# Patient Record
Sex: Female | Born: 2000 | Race: Black or African American | Hispanic: No | Marital: Single | Smoking: Never smoker
Health system: Southern US, Community
[De-identification: ages and names within clinical notes are randomized; demographics above are authoritative.]

## PROBLEM LIST (undated history)

## (undated) ENCOUNTER — Ambulatory Visit: Payer: MEDICAID | Source: Home / Self Care

## (undated) ENCOUNTER — Inpatient Hospital Stay (HOSPITAL_COMMUNITY): Payer: Self-pay

## (undated) ENCOUNTER — Ambulatory Visit: Payer: MEDICAID

## (undated) DIAGNOSIS — D649 Anemia, unspecified: Secondary | ICD-10-CM

## (undated) DIAGNOSIS — N83209 Unspecified ovarian cyst, unspecified side: Secondary | ICD-10-CM

## (undated) DIAGNOSIS — R519 Headache, unspecified: Secondary | ICD-10-CM

## (undated) DIAGNOSIS — F419 Anxiety disorder, unspecified: Secondary | ICD-10-CM

## (undated) DIAGNOSIS — Z91030 Bee allergy status: Secondary | ICD-10-CM

## (undated) DIAGNOSIS — Z9109 Other allergy status, other than to drugs and biological substances: Secondary | ICD-10-CM

## (undated) DIAGNOSIS — F32A Depression, unspecified: Secondary | ICD-10-CM

## (undated) DIAGNOSIS — N390 Urinary tract infection, site not specified: Secondary | ICD-10-CM

## (undated) HISTORY — PX: IUD INSERTION: OBO1003

## (undated) HISTORY — PX: NO PAST SURGERIES: SHX2092

## (undated) HISTORY — PX: TOOTH EXTRACTION: SUR596

---
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
## 2019-03-06 IMAGING — US US PELVIS COMPLETE
1 series · 13 of 25 positions shown · non-contrast
Comparison: None.

CLINICAL DATA: Initial evaluation for acute bilateral pelvic pain
for 1 week.

EXAM:
TRANSABDOMINAL AND TRANSVAGINAL ULTRASOUND OF PELVIS
DOPPLER ULTRASOUND OF OVARIES
TECHNIQUE: Both transabdominal and transvaginal ultrasound examinations of the
pelvis were performed. Transabdominal technique was performed for
global imaging of the pelvis including uterus, ovaries, adnexal
regions, and pelvic cul-de-sac.
It was necessary to proceed with endovaginal exam following the
transabdominal exam to visualize the uterus, endometrium, and
ovaries. Color and duplex Doppler ultrasound was utilized to
evaluate blood flow to the ovaries.

[Series 1: us pelvis complete · 133 acquisitions, 13 frames shown]
[im 1/133]
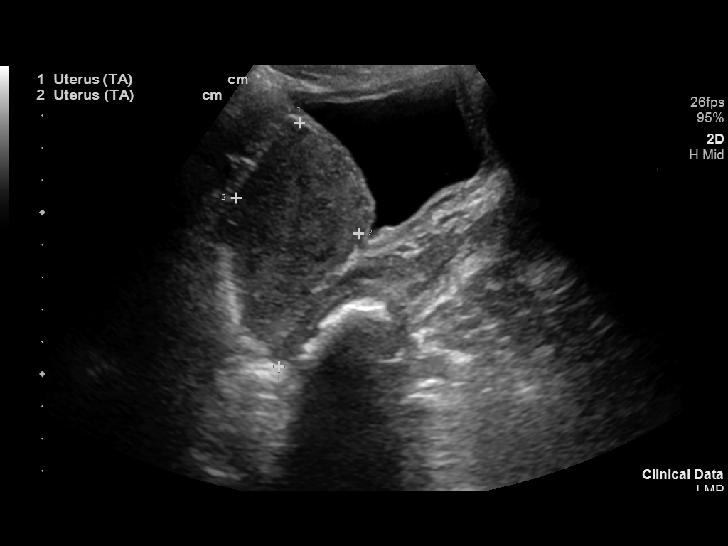
[im 12/133]
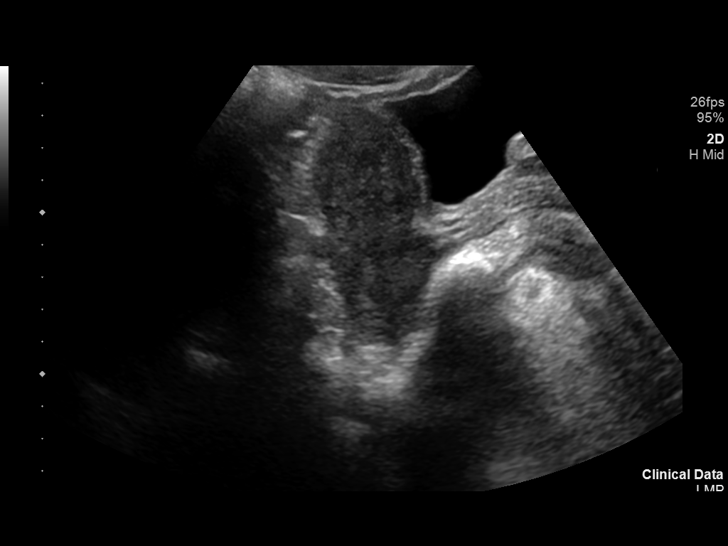
[im 23/133]
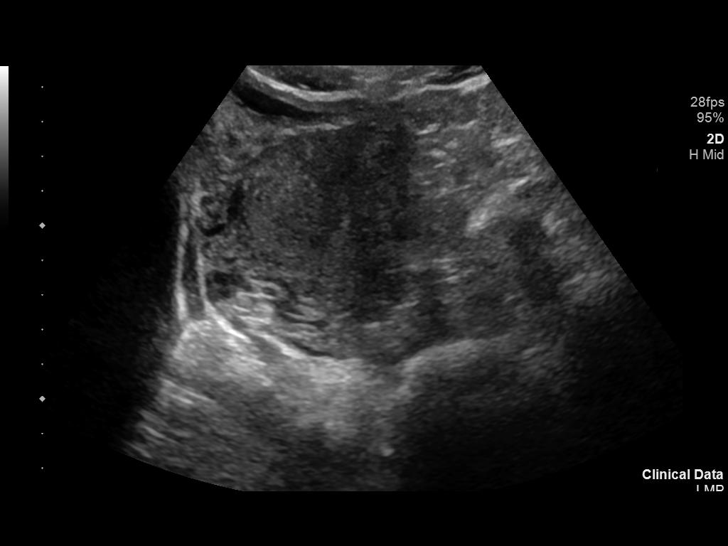
[im 34/133]
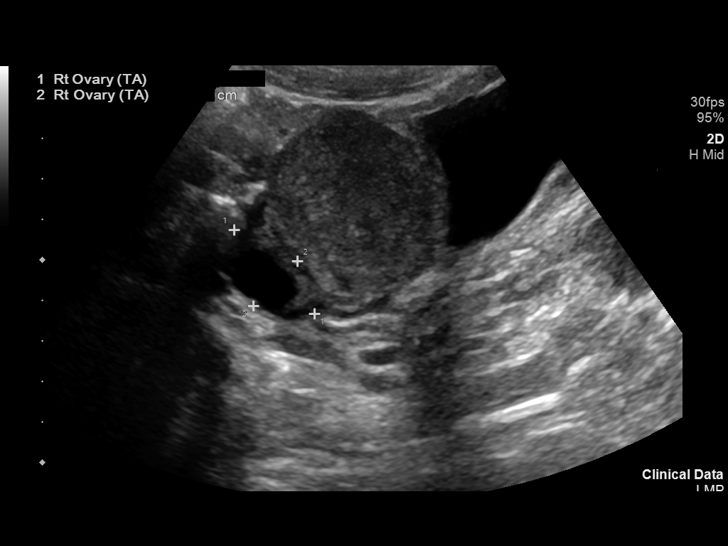
[im 45/133]
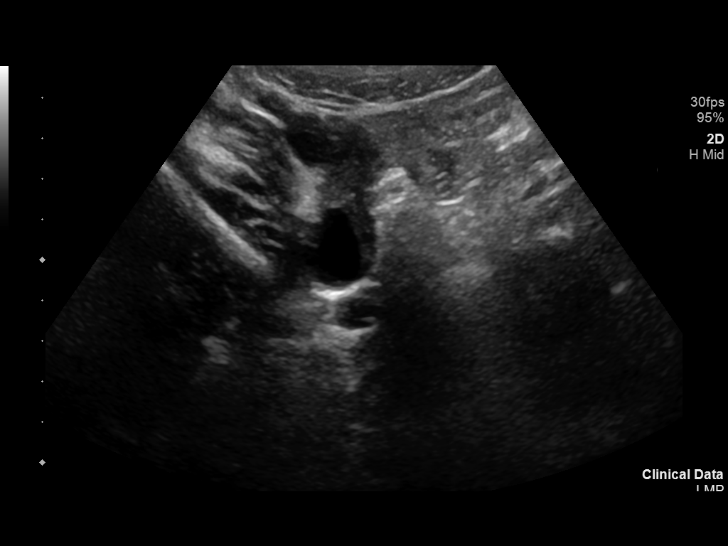
[im 56/133]
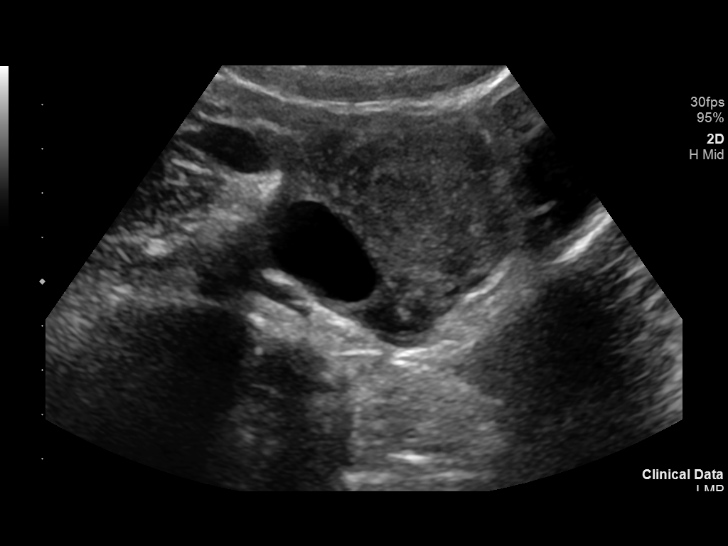
[im 67/133]
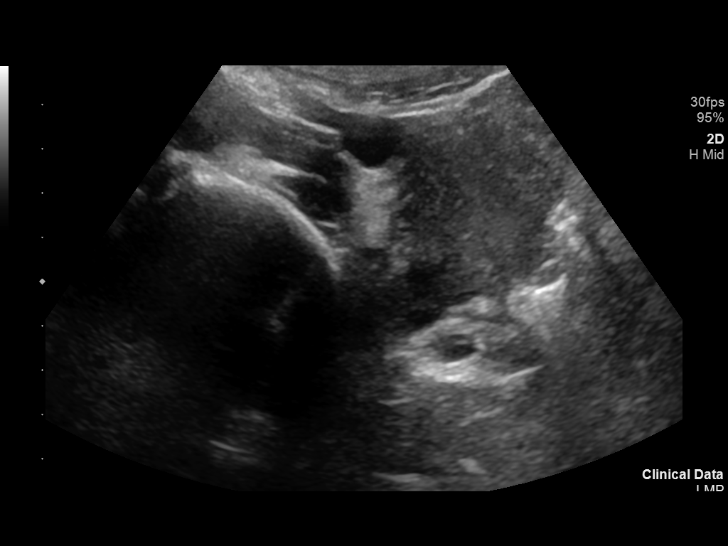
[im 78/133]
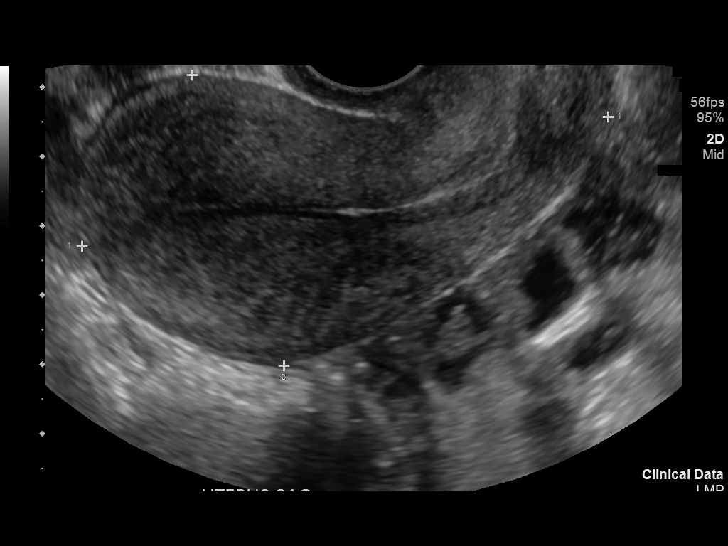
[im 89/133]
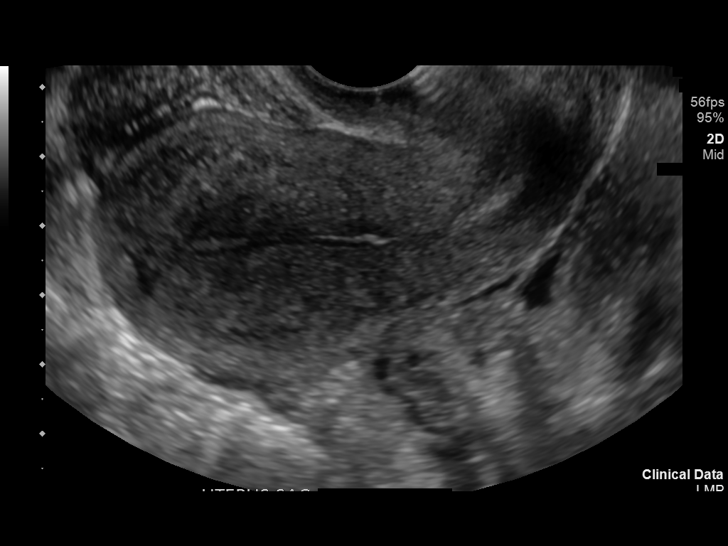
[im 100/133]
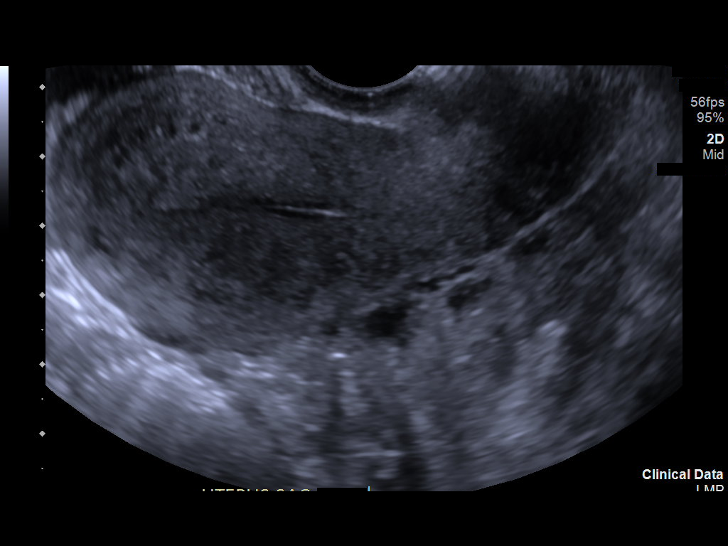
[im 111/133]
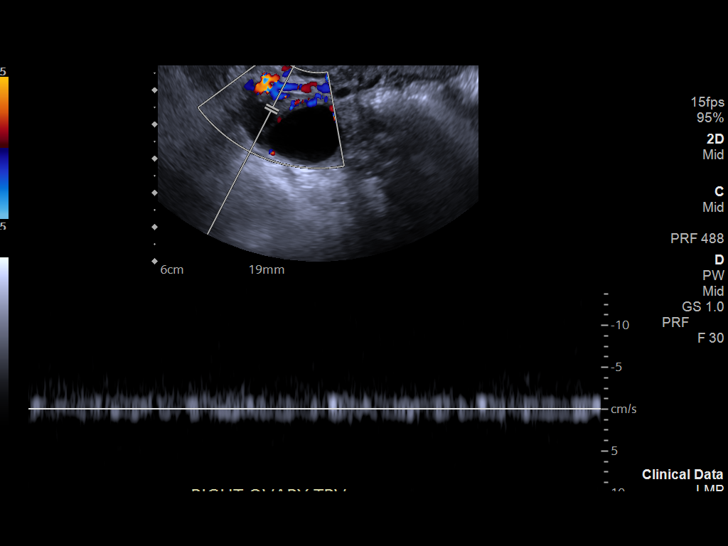
[im 122/133]
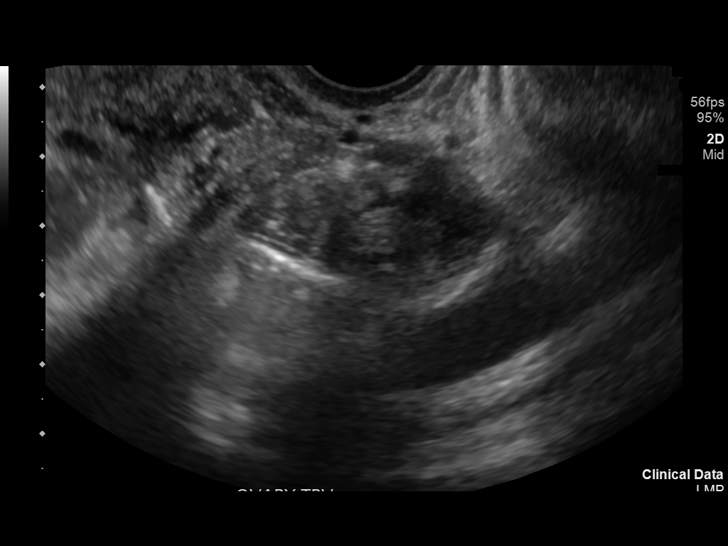
[im 133/133]
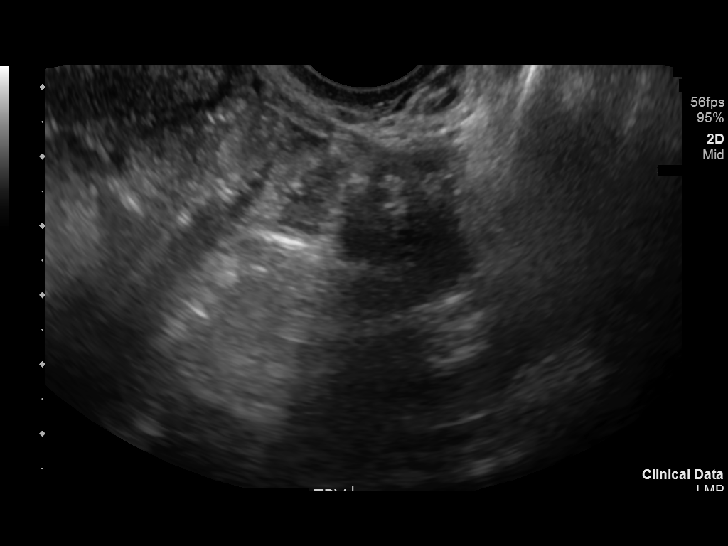

[13 of 25 positions shown; findings below may reference images not displayed]

FINDINGS: Uterus

Measurements: 7.8 x 4.4 x 4.9 cm = volume: 88.5 mL. No fibroids or
other mass visualized.

Endometrium

Thickness: 2.3 mm.  No focal abnormality visualized.

Right ovary

Measurements: 3.1 x 2.1 x 2.8 cm = volume: 9.8 mL. Normal
appearance/no adnexal mass. 2.4 x 1.5 x 2.1 cm dominant follicle
noted.

Left ovary

Measurements: 3.6 x 1.4 x 2.0 cm = volume: 5.3 mL. Normal
appearance/no adnexal mass.

Pulsed Doppler evaluation of both ovaries demonstrates normal
low-resistance arterial and venous waveforms.

Other findings

No abnormal free fluid.
IMPRESSION: 1. Normal pelvic ultrasound. No evidence for torsion or other acute
finding.
2. 2.4 cm simple right ovarian cyst, most consistent with a dominant
follicle/physiologic follicular cyst.

## 2019-04-28 ENCOUNTER — Encounter (HOSPITAL_COMMUNITY): Payer: Self-pay

## 2019-04-28 ENCOUNTER — Ambulatory Visit (HOSPITAL_COMMUNITY)
Admission: EM | Admit: 2019-04-28 | Discharge: 2019-04-28 | Disposition: A | Discharge status: Home / Self Care | Payer: Medicaid Other | Attending: Emergency Medicine | Admitting: Emergency Medicine

## 2019-04-28 ENCOUNTER — Other Ambulatory Visit: Payer: Self-pay

## 2019-04-28 DIAGNOSIS — Z3202 Encounter for pregnancy test, result negative: Secondary | ICD-10-CM | POA: Diagnosis not present

## 2019-04-28 DIAGNOSIS — R102 Pelvic and perineal pain: Secondary | ICD-10-CM | POA: Diagnosis present

## 2019-04-28 LAB — POCT URINALYSIS DIP (DEVICE)
Bilirubin Urine: NEGATIVE
Glucose, UA: NEGATIVE mg/dL
Ketones, ur: NEGATIVE mg/dL
Leukocytes,Ua: NEGATIVE
Nitrite: NEGATIVE
Protein, ur: >=300 mg/dL — AB
Specific Gravity, Urine: >=1.03 (ref 1.005–1.030)
Urobilinogen, UA: 0.2 mg/dL (ref 0.0–1.0)
pH: 6.5 (ref 5.0–8.0)

## 2019-04-28 LAB — POCT PREGNANCY, URINE: Preg Test, Ur: NEGATIVE

## 2019-04-28 MED ORDER — NAPROXEN 375 MG PO TABS: 375.0000 mg | ORAL_TABLET | Freq: Two times a day (BID) | ORAL | 0 refills | Status: DC

## 2019-04-28 NOTE — Discharge Instructions (Signed)
You do not have any obvious UTI here today, I will send a culture to confirm this.  We will check for infection to ensure this is not the source either. Menstruation or ovarian cysts may contribute. If symptoms persist I do recommend following up with gynecology.  Naproxen twice a day may be helpful.  Drink plenty of water to empty your bladder regularly.

## 2019-04-28 NOTE — ED Provider Notes (Signed)
[facility]    CSN: [id number] Arrival date & time: 04/28/19  3382      History   Chief Complaint Chief Complaint  Patient presents with  . Urinary Frequency    HPI Valerie Newman is a 18 y.o. female.   Valerie Newman presents with complaints of some sensation of pelvic pain which is intermittent, worse when her bladder is full. Some urinary urgency. No frequency, no dysuria. Started approximately 1 week ago. No blood in urine. No back pain, no fevers. No current abdominal pain. No vaginal discharge. Doesn't take any medications. History  Of ovarian cysts. Doesn't follow regularly with a gynecologist. She is currently on her period.     ROS per HPI, negative if not otherwise mentioned.      Past Medical History:  Diagnosis Date  . 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 Allergy to pollen   . Anemia   . Anxiety   . Asthma     Patient Active Problem List   Diagnosis Date Noted  . Nexplanon insertion   . Chlamydia infection 10/15/2017    Past Surgical History:  Procedure Laterality Date  . TOOTH EXTRACTION      OB History    Gravida  1   Para  1   Term  1   Preterm  0   AB  0   Living  1     SAB  0   TAB  0   Ectopic  0   Multiple  0   Live Births  1            Home Medications    Prior to Admission medications   Medication Sig Start Date End Date Taking? Authorizing Provider  famotidine (PEPCID) 20 MG tablet Take 1 tablet (20 mg total) by mouth daily. Patient not taking: Reported on 10/03/2018 12/01/17   Marny Lowenstein, PA-C  hydrocortisone (ANUSOL-HC) 2.5 % rectal cream Place 1 application rectally 2 (two) times daily. 10/03/18   [name], NP  ibuprofen (ADVIL,MOTRIN) 600 MG tablet Take 1 tablet (600 mg total) by mouth every 8 (eight) hours as needed for moderate pain. 07/14/18   Shaune Pollack, MD  iron polysaccharides (FERREX 150) 150 MG capsule Take 1 capsule (150 mg total) by mouth daily. 10/03/18   [name], NP  naproxen (NAPROSYN) 375 MG tablet Take 1 tablet (375 mg total) by mouth 2 (two) times daily. 04/28/19   Georgetta Haber, NP  ondansetron (ZOFRAN ODT) 4 MG disintegrating tablet Take 1 tablet (4 mg total) by mouth every 8 (eight) hours as needed for nausea or vomiting. 07/14/18   Shaune Pollack, MD  Prenatal Multivit-Min-Fe-FA (PRENATAL VITAMINS PO) Take 1 tablet by mouth daily.    [provider]  PRESCRIPTION MEDICATION Apply 1 application topically as needed (rash). Pt states she's using a prescription strength cream to help with a rash on stomach/neck line. [facility] had no history of a cream.    [provider]  simethicone (GAS-X) 80 MG chewable tablet Chew 1 tablet (80 mg total) by mouth every 6 (six) hours as needed for flatulence. 07/14/18   Gwenevere Abbot, MD    Family History Family History  Problem Relation Age of Onset  . Heart disease Mother   . Stroke Mother   . Diabetes Maternal Grandmother     Social History Social History   Tobacco Use  . Smoking status: Never Smoker  . Smokeless tobacco:  Never Used  Substance Use Topics  . Alcohol use: No    Frequency: Never  . Drug use: No     Allergies   Bee venom and Wasp venom   Review of Systems Review of Systems   Physical Exam Triage Vital Signs ED Triage Vitals  Enc Vitals Group     BP 04/28/19 0953 123/76     Pulse Rate 04/28/19 0953 84     Resp 04/28/19 0953 17     Temp 04/28/19 0953 98 F (36.7 C)     Temp Source 04/28/19 0953 Oral     SpO2 04/28/19 0953 99 %     Weight --      Height --      Head Circumference --      Peak Flow --      Pain Score 04/28/19 0951 0     Pain Loc --      Pain Edu? --      Excl. in GC? --    No data found.  Updated Vital Signs BP 123/76 (BP Location: Right Arm)   Pulse 84   Temp 98 F (36.7 C) (Oral)   Resp 17   SpO2 99%    Physical Exam Constitutional:      General: She is not in acute distress.    Appearance: She is  well-developed.  Cardiovascular:     Rate and Rhythm: Normal rate.  Pulmonary:     Effort: Pulmonary effort is normal.  Abdominal:     Palpations: Abdomen is soft. Abdomen is not rigid.     Tenderness: There is no abdominal tenderness. There is no guarding or rebound.  Genitourinary:    Comments: Denies sores, lesions; no pelvic pain; gu exam deferred at this time, vaginal self swab collected; endorses menstrual bleeding  Skin:    General: Skin is warm and dry.  Neurological:     Mental Status: She is alert and oriented to person, place, and time.      UC Treatments / Results  Labs (all labs ordered are listed, but only abnormal results are displayed) Labs Reviewed  POCT URINALYSIS DIP (DEVICE) - Abnormal; Notable for the following components:      Result Value   Hgb urine dipstick SMALL (*)    Protein, ur >=300 (*)    All other components within normal limits  URINE CULTURE  POC URINE PREG, ED  POCT PREGNANCY, URINE  CERVICOVAGINAL ANCILLARY ONLY    EKG   Radiology No results found.  Procedures Procedures (including critical care time)  Medications Ordered in UC Medications - No data to display  Initial Impression / Assessment and Plan / UC Course  I have reviewed the triage vital signs and the nursing notes.  Pertinent labs & imaging results that were available during my care of the patient were reviewed by me and considered in my medical decision making (see chart for details).     No abdominal pain currently. Afebrile. Non toxic. Urine without indication of UTI at this time, culture pending. Vaginal cytology also pending. Menstrual cramping discussed vs ovarian cysts. Pain management discussed. Encouraged follow up with gynecology. Return precautions provided. Patient verbalized understanding and agreeable to plan.   Final Clinical Impressions(s) / UC Diagnoses   Final diagnoses:  Pelvic pain     Discharge Instructions     You do not have any obvious  UTI here today, I will send a culture to confirm this.  We will check for infection  to ensure this is not the source either. Menstruation or ovarian cysts may contribute. If symptoms persist I do recommend following up with gynecology.  Naproxen twice a day may be helpful.  Drink plenty of water to empty your bladder regularly.    ED Prescriptions    Medication Sig Dispense Auth. Provider   naproxen (NAPROSYN) 375 MG tablet Take 1 tablet (375 mg total) by mouth 2 (two) times daily. 20 tablet Zigmund Gottron, NP     PDMP not reviewed this encounter.   Zigmund Gottron, NP 04/28/19 1127

## 2019-04-28 NOTE — ED Triage Notes (Signed)
Patient presents to Urgent Care with complaints of urinary frequency since a few days ago. Patient reports last time this happened she was pregnant, but does not think she is pregnant this time.

## 2019-04-29 LAB — URINE CULTURE: Culture: NO GROWTH

## 2019-05-02 LAB — CERVICOVAGINAL ANCILLARY ONLY
Bacterial vaginitis: NEGATIVE
Candida vaginitis: NEGATIVE
Chlamydia: NEGATIVE
Neisseria Gonorrhea: NEGATIVE
Trichomonas: NEGATIVE

## 2019-05-08 ENCOUNTER — Other Ambulatory Visit: Payer: Self-pay

## 2019-05-08 ENCOUNTER — Encounter (INDEPENDENT_AMBULATORY_CARE_PROVIDER_SITE_OTHER): Payer: Self-pay | Admitting: Primary Care

## 2019-05-08 ENCOUNTER — Encounter (HOSPITAL_COMMUNITY): Payer: Self-pay

## 2019-05-08 ENCOUNTER — Ambulatory Visit (HOSPITAL_COMMUNITY)
Admission: EM | Admit: 2019-05-08 | Discharge: 2019-05-08 | Disposition: A | Discharge status: Home / Self Care | Payer: Medicaid Other | Attending: Family Medicine | Admitting: Family Medicine

## 2019-05-08 DIAGNOSIS — R102 Pelvic and perineal pain: Secondary | ICD-10-CM | POA: Diagnosis not present

## 2019-05-08 DIAGNOSIS — N939 Abnormal uterine and vaginal bleeding, unspecified: Secondary | ICD-10-CM | POA: Diagnosis not present

## 2019-05-08 DIAGNOSIS — Z3202 Encounter for pregnancy test, result negative: Secondary | ICD-10-CM

## 2019-05-08 LAB — POCT URINALYSIS DIP (DEVICE)
Glucose, UA: NEGATIVE mg/dL
Ketones, ur: NEGATIVE mg/dL
Nitrite: NEGATIVE
Protein, ur: >=300 mg/dL — AB
Specific Gravity, Urine: 1.025 (ref 1.005–1.030)
Urobilinogen, UA: 0.2 mg/dL (ref 0.0–1.0)
pH: 6.5 (ref 5.0–8.0)

## 2019-05-08 LAB — POC URINE PREG, ED: Preg Test, Ur: NEGATIVE

## 2019-05-08 LAB — POCT PREGNANCY, URINE: Preg Test, Ur: NEGATIVE

## 2019-05-08 NOTE — ED Triage Notes (Addendum)
Pt presents to UC w/ c/o seeing spotting after sexual intercourse yesterday. Pt states she is having some cramps. Pt reports seeing blood when she wiped this morning as well as some pain in lower abdomen. Pt states she finished her menstrual period 2 weeks ago.

## 2019-05-08 NOTE — ED Provider Notes (Signed)
[place]    CSN: [id number] Arrival date & time: 05/08/19  0802      History   Chief Complaint Chief Complaint  Patient presents with  . vag spotting    HPI Valerie Newman is a 18 y.o. female.   Corrin Newman presents with complaints of vaginal spotting which started yesterday. Pelvic cramping. This feels different than cramping she has had previously. Had sexual intercourse prior to spotting onset. Was seen here two weeks ago with pelvic cramping, normal urine, negative culture and negative vaginal cytology at that time. LMP was two weeks ago. She has an IUD. Some pain with urination and some frequency. No blood to urine. No back or flank pain. No gi symptoms. History of ovarian cyst in the past. Hasn't followed with gynecology.    ROS per HPI, negative if not otherwise mentioned.      Past Medical History:  Diagnosis Date  . 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 Allergy to pollen   . Anemia   . Anxiety   . Asthma     Patient Active Problem List   Diagnosis Date Noted  . Nexplanon insertion   . Chlamydia infection 10/15/2017    Past Surgical History:  Procedure Laterality Date  . TOOTH EXTRACTION      OB History    Gravida  1   Para  1   Term  1   Preterm  0   AB  0   Living  1     SAB  0   TAB  0   Ectopic  0   Multiple  0   Live Births  1            Home Medications    Prior to Admission medications   Medication Sig Start Date End Date Taking? Authorizing Provider  famotidine (PEPCID) 20 MG tablet Take 1 tablet (20 mg total) by mouth daily. Patient not taking: Reported on 10/03/2018 12/01/17   Luvenia Redden, PA-C  hydrocortisone (ANUSOL-HC) 2.5 % rectal cream Place 1 application rectally 2 (two) times daily. 10/03/18   Kerin Perna, NP  ibuprofen (ADVIL,MOTRIN) 600 MG tablet Take 1 tablet (600 mg total) by mouth every 8 (eight) hours as needed for moderate pain. 07/14/18   Duffy Bruce, MD  iron  polysaccharides (FERREX 150) 150 MG capsule Take 1 capsule (150 mg total) by mouth daily. 10/03/18   Kerin Perna, NP  naproxen (NAPROSYN) 375 MG tablet Take 1 tablet (375 mg total) by mouth 2 (two) times daily. 04/28/19   Zigmund Gottron, NP  ondansetron (ZOFRAN ODT) 4 MG disintegrating tablet Take 1 tablet (4 mg total) by mouth every 8 (eight) hours as needed for nausea or vomiting. 07/14/18   Duffy Bruce, MD  Prenatal Multivit-Min-Fe-FA (PRENATAL VITAMINS PO) Take 1 tablet by mouth daily.    [provider]  PRESCRIPTION MEDICATION Apply 1 application topically as needed (rash). Pt states she's using a prescription strength cream to help with a rash on stomach/neck line. [facility] had no history of a cream.    [provider]  simethicone (GAS-X) 80 MG chewable tablet Chew 1 tablet (80 mg total) by mouth every 6 (six) hours as needed for flatulence. 07/14/18   Aura Camps, MD    Family History Family History  Problem Relation Age of Onset  . Heart disease Mother   . Stroke Mother   . Diabetes Maternal Grandmother  Social History Social History   Tobacco Use  . Smoking status: Never Smoker  . Smokeless tobacco: Never Used  Substance Use Topics  . Alcohol use: No    Frequency: Never  . Drug use: No     Allergies   Bee venom and Wasp venom   Review of Systems Review of Systems   Physical Exam Triage Vital Signs ED Triage Vitals  Enc Vitals Group     BP 05/08/19 0814 104/69     Pulse Rate 05/08/19 0814 87     Resp 05/08/19 0814 16     Temp 05/08/19 0814 99.1 F (37.3 C)     Temp Source 05/08/19 0814 Oral     SpO2 05/08/19 0814 100 %     Weight --      Height --      Head Circumference --      Peak Flow --      Pain Score 05/08/19 0817 0     Pain Loc --      Pain Edu? --      Excl. in GC? --    No data found.  Updated Vital Signs BP 104/69 (BP Location: Left Arm)   Pulse 87   Temp 99.1 F (37.3 C) (Oral)   Resp 16   SpO2  100%   Visual Acuity Right Eye Distance:   Left Eye Distance:   Bilateral Distance:    Right Eye Near:   Left Eye Near:    Bilateral Near:     Physical Exam Constitutional:      General: She is not in acute distress.    Appearance: She is well-developed.  Cardiovascular:     Rate and Rhythm: Normal rate.  Pulmonary:     Effort: Pulmonary effort is normal.  Abdominal:     Tenderness: There is no right CVA tenderness, left CVA tenderness, guarding or rebound.  Genitourinary:    Labia:        Right: No tenderness or lesion.        Left: No tenderness or lesion.      Cervix: Normal.     Comments: Dark brown blood, small amount, within vaginal vault without visualization of any active bleeding; IUD strings present Skin:    General: Skin is warm and dry.  Neurological:     Mental Status: She is alert and oriented to person, place, and time.      UC Treatments / Results  Labs (all labs ordered are listed, but only abnormal results are displayed) Labs Reviewed  POCT URINALYSIS DIP (DEVICE) - Abnormal; Notable for the following components:      Result Value   Bilirubin Urine SMALL (*)    Hgb urine dipstick LARGE (*)    Protein, ur >=300 (*)    Leukocytes,Ua SMALL (*)    All other components within normal limits  URINE CULTURE  POC URINE PREG, ED  CERVICOVAGINAL ANCILLARY ONLY    EKG   Radiology No results found.  Procedures Procedures (including critical care time)  Medications Ordered in UC Medications - No data to display  Initial Impression / Assessment and Plan / UC Course  I have reviewed the triage vital signs and the nursing notes.  Pertinent labs & imaging results that were available during my care of the patient were reviewed by me and considered in my medical decision making (see chart for details).     Non toxic. Benign physical exam. Vaginal spotting after intercourse. Repeat vaginal cytology  collected. Discussed potential sources of spotting.  Encouraged follow up with gyn as patient has had intermittent episodes of cramping with history of cysts. Return precautions provided. Patient verbalized understanding and agreeable to plan.    Final Clinical Impressions(s) / UC Diagnoses   Final diagnoses:  Vaginal spotting  Pelvic cramping     Discharge Instructions     Naproxen twice a day as needed for cramping.  Drink plenty of water.  Heat application to pelvis as needed for cramping.  Will notify you of any positive findings from your vaginal swab and if any changes to treatment are needed. Please make an appointment with gynecology for recheck and to establish care.  Please return or go to the ER for any worsening of pain, fevers, weakness or dizziness, or otherwise worsening.       ED Prescriptions    None     PDMP not reviewed this encounter.   Georgetta HaberBurky, Delorse Shane B, NP 05/08/19 [phone]

## 2019-05-08 NOTE — Discharge Instructions (Signed)
Naproxen twice a day as needed for cramping.  Drink plenty of water.  Heat application to pelvis as needed for cramping.  Will notify you of any positive findings from your vaginal swab and if any changes to treatment are needed. Please make an appointment with gynecology for recheck and to establish care.  Please return or go to the ER for any worsening of pain, fevers, weakness or dizziness, or otherwise worsening.

## 2019-05-09 NOTE — Progress Notes (Signed)
Call transferred from the front office.  Pt states that she has been to Urgent Care several times over the past two weeks.  Pt reports that she is having vaginal spotting, some pelvic pain, and back pain.  Per chart review, Urgent tested for UTI and STI on 04/28/19 resulting negative and on 05/08/19 results are still pending.  I asked pt if she was on [facility].  She reports that she has an IUD that she has had for about a year.  I explained to the pt that she could have irregular bleeding with an IUD which could be related to why she is having vaginal spotting.  I also informed her that at this time she should wait on the results from Urgent Care because we not do anything different at this time.  I also encouraged pt to take the Naproxen and to utilize the heating bad for her back as it also sounds like she is having PMS symptoms.  Pt verbalized understanding.

## 2019-05-10 ENCOUNTER — Encounter (HOSPITAL_COMMUNITY): Payer: Self-pay

## 2019-05-10 ENCOUNTER — Telehealth (HOSPITAL_COMMUNITY): Payer: Self-pay | Admitting: Emergency Medicine

## 2019-05-10 LAB — URINE CULTURE: Culture: >=100000 — AB

## 2019-05-10 LAB — CERVICOVAGINAL ANCILLARY ONLY
Bacterial vaginitis: NEGATIVE
Candida vaginitis: NEGATIVE
Chlamydia: NEGATIVE
Neisseria Gonorrhea: NEGATIVE
Trichomonas: POSITIVE — AB

## 2019-05-10 MED ORDER — CEPHALEXIN 500 MG PO CAPS: 500.0000 mg | ORAL_CAPSULE | Freq: Two times a day (BID) | ORAL | 0 refills | Status: AC

## 2019-05-10 MED ORDER — METRONIDAZOLE 500 MG PO TABS: 2000.0000 mg | ORAL_TABLET | Freq: Once | ORAL | 0 refills | Status: AC

## 2019-05-10 NOTE — Telephone Encounter (Signed)
Patient contacted and made aware of urine culture and cytology   results. Pt verbalized understanding and had all questions answered.

## 2019-05-10 NOTE — Telephone Encounter (Signed)
Urine culture positive for E coli, needs antibiotic for treatment. Trichomonas is positive, untreated. Attempted to reach patient. No answer at this time. Voicemail left.   Medications sent to pharmacy on record. Per Dr. Mannie Stabile, keflex bid x5 days.

## 2019-05-15 ENCOUNTER — Telehealth: Payer: Self-pay | Admitting: *Deleted

## 2019-05-15 NOTE — Telephone Encounter (Signed)
Per pt mychart request to sch appt for rectal and vaginal bleeding, staff called patient and left her a message to call office back to schedule appointment. Office number provided on patient voicemail.

## 2019-05-16 ENCOUNTER — Telehealth: Payer: Self-pay | Admitting: *Deleted

## 2019-05-16 NOTE — Telephone Encounter (Signed)
Note our patient

## 2019-05-16 NOTE — Telephone Encounter (Signed)
Spoke with patient regarding her mychart message about rectal bleeding and vaginal bleeding. Staff provider [facility] office number for patient to call due to patient last appt was with provider from that clinic. Per pt should like to know if provider will be able to send her for an Ultrasound to get her cyst checked. Staff informed patient she will have to discuss that with provider when she schedule that appt. Patient agreed to call [facility] to schedule an appt.

## 2019-05-18 ENCOUNTER — Other Ambulatory Visit: Payer: Self-pay

## 2019-05-18 ENCOUNTER — Encounter (INDEPENDENT_AMBULATORY_CARE_PROVIDER_SITE_OTHER): Payer: Self-pay | Admitting: Primary Care

## 2019-05-18 ENCOUNTER — Other Ambulatory Visit (HOSPITAL_COMMUNITY)
Admission: RE | Admit: 2019-05-18 | Discharge: 2019-05-18 | Disposition: A | Discharge status: Home / Self Care | Payer: Medicaid Other | Source: Ambulatory Visit | Attending: Primary Care | Admitting: Primary Care

## 2019-05-18 ENCOUNTER — Ambulatory Visit (INDEPENDENT_AMBULATORY_CARE_PROVIDER_SITE_OTHER): Payer: Medicaid Other | Admitting: Primary Care

## 2019-05-18 VITALS — BP 113/74 | HR 82 | Temp 97.3°F | Ht 62.0 in | Wt 124.8 lb

## 2019-05-18 DIAGNOSIS — N898 Other specified noninflammatory disorders of vagina: Secondary | ICD-10-CM | POA: Diagnosis not present

## 2019-05-18 DIAGNOSIS — N938 Other specified abnormal uterine and vaginal bleeding: Secondary | ICD-10-CM | POA: Diagnosis not present

## 2019-05-18 MED ORDER — FLUCONAZOLE 150 MG PO TABS: 150.0000 mg | ORAL_TABLET | Freq: Once | ORAL | 1 refills | Status: AC

## 2019-05-18 NOTE — Patient Instructions (Signed)
Vaginal Yeast infection, Adult  Vaginal yeast infection is a condition that causes vaginal discharge as well as soreness, swelling, and redness (inflammation) of the vagina. This is a common condition. Some women get this infection frequently. What are the causes? This condition is caused by a change in the normal balance of the yeast (candida) and bacteria that live in the vagina. This change causes an overgrowth of yeast, which causes the inflammation. What increases the risk? The condition is more likely to develop in women who:  Take antibiotic medicines.  Have diabetes.  Take birth control pills.  Are pregnant.  Douche often.  Have a weak body defense system (immune system).  Have been taking steroid medicines for a long time.  Frequently wear tight clothing. What are the signs or symptoms? Symptoms of this condition include:  White, thick, creamy vaginal discharge.  Swelling, itching, redness, and irritation of the vagina. The lips of the vagina (vulva) may be affected as well.  Pain or a burning feeling while urinating.  Pain during sex. How is this diagnosed? This condition is diagnosed based on:  Your medical history.  A physical exam.  A pelvic exam. Your health care provider will examine a sample of your vaginal discharge under a microscope. Your health care provider may send this sample for testing to confirm the diagnosis. How is this treated? This condition is treated with medicine. Medicines may be over-the-counter or prescription. You may be told to use one or more of the following:  Medicine that is taken by mouth (orally).  Medicine that is applied as a cream (topically).  Medicine that is inserted directly into the vagina (suppository). Follow these instructions at home:  Lifestyle  Do not have sex until your health care provider approves. Tell your sex partner that you have a yeast infection. That person should go to his or her health care  provider and ask if they should also be treated.  Do not wear tight clothes, such as pantyhose or tight pants.  Wear breathable cotton underwear. General instructions  Take or apply over-the-counter and prescription medicines only as told by your health care provider.  Eat more yogurt. This may help to keep your yeast infection from returning.  Do not use tampons until your health care provider approves.  Try taking a sitz bath to help with discomfort. This is a warm water bath that is taken while you are sitting down. The water should only come up to your hips and should cover your buttocks. Do this 3-4 times per day or as told by your health care provider.  Do not douche.  If you have diabetes, keep your blood sugar levels under control.  Keep all follow-up visits as told by your health care provider. This is important. Contact a health care provider if:  You have a fever.  Your symptoms go away and then return.  Your symptoms do not get better with treatment.  Your symptoms get worse.  You have new symptoms.  You develop blisters in or around your vagina.  You have blood coming from your vagina and it is not your menstrual period.  You develop pain in your abdomen. Summary  Vaginal yeast infection is a condition that causes discharge as well as soreness, swelling, and redness (inflammation) of the vagina.  This condition is treated with medicine. Medicines may be over-the-counter or prescription.  Take or apply over-the-counter and prescription medicines only as told by your health care provider.  Do not douche.   Do not have sex or use tampons until your health care provider approves.  Contact a health care provider if your symptoms do not get better with treatment or your symptoms go away and then return. This information is not intended to replace advice given to you by your health care provider. Make sure you discuss any questions you have with your health care  provider. Document Released: 03/11/2005 Document Revised: 10/18/2017 Document Reviewed: 10/18/2017 Elsevier Patient Education  2020 [employer]  

## 2019-05-18 NOTE — Progress Notes (Signed)
Acute Office Visit  Subjective:    Patient ID: Valerie Newman, female    DOB: 01-10-2001, 18 y.o.   MRN: [id number]  Chief Complaint  Patient presents with  . Vaginal Bleeding  . Vaginal Itching    HPI Patient is in today for vaginal itching currently on antibiotics.  Past Medical History:  Diagnosis Date  . 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 Allergy to pollen   . Anemia   . Anxiety   . Asthma     Past Surgical History:  Procedure Laterality Date  . TOOTH EXTRACTION      Family History  Problem Relation Age of Onset  . Heart disease Mother   . Stroke Mother   . Diabetes Maternal Grandmother     Social History   Socioeconomic History  . Marital status: Single    Spouse name: Not on file  . Number of children: Not on file  . Years of education: Not on file  . Highest education level: Not on file  Occupational History  . Not on file  Social Needs  . Financial resource strain: Not on file  . Food insecurity    Worry: Not on file    Inability: Not on file  . Transportation needs    Medical: Not on file    Non-medical: Not on file  Tobacco Use  . Smoking status: Never Smoker  . Smokeless tobacco: Never Used  Substance and Sexual Activity  . Alcohol use: No    Frequency: Never  . Drug use: No  . Sexual activity: Not Currently    Birth control/protection: I.U.D.    Comment: placed 2020  Lifestyle  . Physical activity    Days per week: Not on file    Minutes per session: Not on file  . Stress: Not on file  Relationships  . Social Herbalist on phone: Not on file    Gets together: Not on file    Attends religious service: Not on file    Active member of club or organization: Not on file    Attends meetings of clubs or organizations: Not on file    Relationship status: Not on file  . Intimate partner violence    Fear of current or ex partner: Not on file    Emotionally abused: Not on file    Physically abused: Not on file    Forced  sexual activity: Not on file  Other Topics Concern  . Not on file  Social History Narrative  . Not on file    Outpatient Medications Prior to Visit  Medication Sig Dispense Refill  . famotidine (PEPCID) 20 MG tablet Take 1 tablet (20 mg total) by mouth daily. (Patient not taking: Reported on 10/03/2018) 30 tablet 1  . hydrocortisone (ANUSOL-HC) 2.5 % rectal cream Place 1 application rectally 2 (two) times daily. 30 g 1  . ibuprofen (ADVIL,MOTRIN) 600 MG tablet Take 1 tablet (600 mg total) by mouth every 8 (eight) hours as needed for moderate pain. 30 tablet 0  . iron polysaccharides (FERREX 150) 150 MG capsule Take 1 capsule (150 mg total) by mouth daily. 90 capsule 3  . naproxen (NAPROSYN) 375 MG tablet Take 1 tablet (375 mg total) by mouth 2 (two) times daily. 20 tablet 0  . ondansetron (ZOFRAN ODT) 4 MG disintegrating tablet Take 1 tablet (4 mg total) by mouth every 8 (eight) hours as needed for nausea or vomiting. 15 tablet 0  .  Prenatal Multivit-Min-Fe-FA (PRENATAL VITAMINS PO) Take 1 tablet by mouth daily.    Marland Kitchen PRESCRIPTION MEDICATION Apply 1 application topically as needed (rash). Pt states she's using a prescription strength cream to help with a rash on stomach/neck line. [facility] had no history of a cream.    . simethicone (GAS-X) 80 MG chewable tablet Chew 1 tablet (80 mg total) by mouth every 6 (six) hours as needed for flatulence. 30 tablet 3   No facility-administered medications prior to visit.     Allergies  Allergen Reactions  . Bee Venom Swelling  . Wasp Venom Swelling    Review of Systems  Genitourinary:       Vaginal discharge itching   All other systems reviewed and are negative.      Objective:    Physical Exam  Constitutional: She is oriented to person, place, and time. She appears well-developed and well-nourished.  HENT:  Head: Normocephalic.  Cardiovascular: Normal rate and regular rhythm.  Pulmonary/Chest: Effort normal and breath sounds normal.   Abdominal: Soft. Bowel sounds are normal.  Musculoskeletal:        General: Normal range of motion.     Cervical back: Neck supple.  Neurological: She is oriented to person, place, and time.  Skin: Skin is warm and dry.  Psychiatric: She has a normal mood and affect. Her behavior is normal. Thought content normal.    BP 113/74 (BP Location: Right Arm, Patient Position: Sitting, Cuff Size: Normal)   Pulse 82   Temp (!) 97.3 F (36.3 C) (Temporal)   Ht 5\' 2"  (1.575 m)   Wt 124 lb 12.8 oz (56.6 kg)   LMP 05/05/2019 (Approximate)   SpO2 98%   Breastfeeding No   BMI 22.83 kg/m  Wt Readings from Last 3 Encounters:  05/18/19 124 lb 12.8 oz (56.6 kg) (44 %, Z= -0.16)*  07/14/18 127 lb 6.4 oz (57.8 kg) (52 %, Z= 0.06)*  07/14/18 130 lb (59 kg) (57 %, Z= 0.18)*   * Growth percentiles are based on CDC (Girls, 2-20 Years) data.    There are no preventive care reminders to display for this patient.  There are no preventive care reminders to display for this patient.   No results found for: TSH Lab Results  Component Value Date   WBC 5.2 07/14/2018   HGB 9.6 (L) 07/14/2018   HCT 33.3 (L) 07/14/2018   MCV 74.5 (L) 07/14/2018   PLT 333 07/14/2018   Lab Results  Component Value Date   NA 136 07/14/2018   K 3.6 07/14/2018   CO2 24 07/14/2018   GLUCOSE 83 07/14/2018   BUN 11 07/14/2018   CREATININE 0.71 07/14/2018   BILITOT 0.7 07/14/2018   ALKPHOS 57 07/14/2018   AST 24 07/14/2018   ALT 11 07/14/2018   PROT 8.1 07/14/2018   ALBUMIN 4.5 07/14/2018   CALCIUM 9.2 07/14/2018   ANIONGAP 8 07/14/2018   No results found for: CHOL No results found for: HDL No results found for: LDLCALC No results found for: TRIG No results found for: CHOLHDL No results found for: 07/16/2018     Assessment & Plan:  Valerie Newman was seen today for vaginal bleeding and vaginal itching.  Diagnoses and all orders for this visit:  Vaginal itching -     Cervicovaginal ancillary only -     HIV  antibody (with reflex); Future  Other orders -     fluconazole (DIFLUCAN) 150 MG tablet; Take 1 tablet (150 mg total) by mouth once  for 1 dose.    No orders of the defined types were placed in this encounter.    Valerie SessionsMichelle P Shalisha Clausing, NP

## 2019-05-22 ENCOUNTER — Other Ambulatory Visit (INDEPENDENT_AMBULATORY_CARE_PROVIDER_SITE_OTHER): Payer: Self-pay | Admitting: Primary Care

## 2019-05-22 LAB — CERVICOVAGINAL ANCILLARY ONLY
Bacterial Vaginitis (gardnerella): NEGATIVE
Candida Glabrata: NEGATIVE
Candida Vaginitis: POSITIVE — AB
Chlamydia: NEGATIVE
Comment: NEGATIVE
Comment: NEGATIVE
Comment: NEGATIVE
Comment: NEGATIVE
Comment: NEGATIVE
Comment: NORMAL
Neisseria Gonorrhea: NEGATIVE
Trichomonas: NEGATIVE

## 2019-05-22 MED ORDER — FLUCONAZOLE 150 MG PO TABS: 150.0000 mg | ORAL_TABLET | Freq: Once | ORAL | 0 refills | Status: AC

## 2019-06-28 ENCOUNTER — Other Ambulatory Visit: Payer: Self-pay

## 2019-06-28 ENCOUNTER — Encounter (HOSPITAL_COMMUNITY): Payer: Self-pay

## 2019-06-28 ENCOUNTER — Ambulatory Visit (HOSPITAL_COMMUNITY)
Admission: EM | Admit: 2019-06-28 | Discharge: 2019-06-28 | Disposition: A | Discharge status: Home / Self Care | Payer: Medicaid Other | Attending: Family Medicine | Admitting: Family Medicine

## 2019-06-28 DIAGNOSIS — K0889 Other specified disorders of teeth and supporting structures: Secondary | ICD-10-CM | POA: Diagnosis not present

## 2019-06-28 MED ORDER — AMOXICILLIN 500 MG PO CAPS: 500.0000 mg | ORAL_CAPSULE | Freq: Two times a day (BID) | ORAL | 0 refills | Status: AC

## 2019-06-28 MED ORDER — IBUPROFEN 800 MG PO TABS: 800.0000 mg | ORAL_TABLET | Freq: Three times a day (TID) | ORAL | 0 refills | Status: DC

## 2019-06-28 NOTE — ED Triage Notes (Signed)
Patient presents to Urgent Care with complaints of left dental apin since earlier today. Patient reports she has taken hydrocodone and orajel  at home which did not help.

## 2019-06-28 NOTE — Discharge Instructions (Signed)
Please use dental resource to contact offices to seek permenant treatment/relief.  ° °Today we have given you an antibiotic. This should help with pain as any infection is cleared.  ° °For pain please take 600mg-800mg of Ibuprofen every 8 hours, take with 1000 mg of Tylenol Extra strength every 8 hours. These are safe to take together. Please take with food.  ° °Please return if you start to experience significant swelling of your face, experiencing fever. °

## 2019-06-28 NOTE — ED Provider Notes (Signed)
[facility]    CSN: [id number] Arrival date & time: 06/28/19  1810      History   Chief Complaint Chief Complaint  Patient presents with  . Dental Pain    HPI Valerie Newman is a 19 y.o. female history of asthma, presenting today for evaluation of dental pain.  Patient states that she has developed dental pain around 1 PM and has persisted over the past 8 hours.  She states that she typically has intermittent dental pain which is responsive to taking medicine.  This pain is worse than normal.  She is unsure of any swelling.  Denies any fevers.  Denies dizziness or lightheadedness.  HPI  Past Medical History:  Diagnosis Date  . 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 Allergy to pollen   . Anemia   . Anxiety   . Asthma     Patient Active Problem List   Diagnosis Date Noted  . Nexplanon insertion   . Chlamydia infection 10/15/2017    Past Surgical History:  Procedure Laterality Date  . TOOTH EXTRACTION      OB History    Gravida  1   Para  1   Term  1   Preterm  0   AB  0   Living  1     SAB  0   TAB  0   Ectopic  0   Multiple  0   Live Births  1            Home Medications    Prior to Admission medications   Medication Sig Start Date End Date Taking? Authorizing Provider  amoxicillin (AMOXIL) 500 MG capsule Take 1 capsule (500 mg total) by mouth 2 (two) times daily for 7 days. 06/28/19 07/05/19  Kashus Karlen C, PA-C  ibuprofen (ADVIL) 800 MG tablet Take 1 tablet (800 mg total) by mouth 3 (three) times daily. 06/28/19   Dorinne Graeff, Junius Creamer, PA-C    Family History Family History  Problem Relation Age of Onset  . Heart disease Mother   . Stroke Mother   . Diabetes Maternal Grandmother     Social History Social History   Tobacco Use  . Smoking status: Never Smoker  . Smokeless tobacco: Never Used  Substance Use Topics  . Alcohol use: No  . Drug use: No     Allergies   Bee venom and Wasp venom   Review of  Systems Review of Systems  Constitutional: Negative for activity change, appetite change, chills, fatigue and fever.  HENT: Positive for dental problem. Negative for congestion, ear pain, rhinorrhea, sinus pressure, sore throat and trouble swallowing.   Eyes: Negative for discharge and redness.  Respiratory: Negative for cough, chest tightness and shortness of breath.   Cardiovascular: Negative for chest pain.  Gastrointestinal: Negative for abdominal pain, diarrhea, nausea and vomiting.  Musculoskeletal: Negative for myalgias.  Skin: Negative for rash.  Neurological: Negative for dizziness, light-headedness and headaches.     Physical Exam Triage Vital Signs ED Triage Vitals  Enc Vitals Group     BP 06/28/19 1830 118/81     Pulse Rate 06/28/19 1830 61     Resp 06/28/19 1830 14     Temp 06/28/19 1830 98.4 F (36.9 C)     Temp Source 06/28/19 1830 Oral     SpO2 06/28/19 1830 100 %     Weight --      Height --  Head Circumference --      Peak Flow --      Pain Score 06/28/19 1829 8     Pain Loc --      Pain Edu? --      Excl. in [place]? --    No data found.  Updated Vital Signs BP 118/81 (BP Location: Right Arm)   Pulse 61   Temp 98.4 F (36.9 C) (Oral)   Resp 14   SpO2 100%   Visual Acuity Right Eye Distance:   Left Eye Distance:   Bilateral Distance:    Right Eye Near:   Left Eye Near:    Bilateral Near:     Physical Exam Vitals and nursing note reviewed.  Constitutional:      General: She is not in acute distress.    Appearance: She is well-developed.  HENT:     Head: Normocephalic and atraumatic.     Comments: No obvious facial swelling, mild tenderness to palpation of left lower posterior mandible    Mouth/Throat:     Comments: Left lower jaw with third to last molar fractured, second to last molar with filling, surrounding gingival tenderness No soft palate swelling, posterior pharynx patent Eyes:     Conjunctiva/sclera: Conjunctivae normal.   Neck:     Comments: No neck swelling or erythema, no lymphadenopathy, full active range of motion Cardiovascular:     Rate and Rhythm: Normal rate and regular rhythm.     Heart sounds: No murmur.  Pulmonary:     Effort: Pulmonary effort is normal. No respiratory distress.     Breath sounds: Normal breath sounds.  Abdominal:     Palpations: Abdomen is soft.     Tenderness: There is no abdominal tenderness.  Musculoskeletal:     Cervical back: Neck supple.  Skin:    General: Skin is warm and dry.  Neurological:     Mental Status: She is alert.      UC Treatments / Results  Labs (all labs ordered are listed, but only abnormal results are displayed) Labs Reviewed - No data to display  EKG   Radiology No results found.  Procedures Procedures (including critical care time)  Medications Ordered in UC Medications - No data to display  Initial Impression / Assessment and Plan / UC Course  I have reviewed the triage vital signs and the nursing notes.  Pertinent labs & imaging results that were available during my care of the patient were reviewed by me and considered in my medical decision making (see chart for details).     Dental pain x1 day, will cover for possible infection/early abscess with amoxicillin, Tylenol and ibuprofen for pain.  Provided dental resource to follow-up with dentistry outpatient given patient's problem is recurrent.  Discussed strict return precautions. Patient verbalized understanding and is agreeable with plan.  Final Clinical Impressions(s) / UC Diagnoses   Final diagnoses:  Pain, dental     Discharge Instructions     Please use dental resource to contact offices to seek permenant treatment/relief.   Today we have given you an antibiotic. This should help with pain as any infection is cleared.   For pain please take 600mg -800mg  of Ibuprofen every 8 hours, take with 1000 mg of Tylenol Extra strength every 8 hours. These are safe to  take together. Please take with food.   Please return if you start to experience significant swelling of your face, experiencing fever.    ED Prescriptions    Medication Sig  Dispense Auth. Provider   amoxicillin (AMOXIL) 500 MG capsule Take 1 capsule (500 mg total) by mouth 2 (two) times daily for 7 days. 14 capsule Roshun Klingensmith C, PA-C   ibuprofen (ADVIL) 800 MG tablet Take 1 tablet (800 mg total) by mouth 3 (three) times daily. 21 tablet March Joos, [place] C, PA-C     PDMP not reviewed this encounter.   Lew Dawes, [place] 06/28/19 1931

## 2019-08-01 ENCOUNTER — Other Ambulatory Visit: Payer: Self-pay

## 2019-08-01 ENCOUNTER — Encounter (HOSPITAL_COMMUNITY): Payer: Self-pay | Admitting: Emergency Medicine

## 2019-08-01 ENCOUNTER — Ambulatory Visit (HOSPITAL_COMMUNITY)
Admission: EM | Admit: 2019-08-01 | Discharge: 2019-08-01 | Disposition: A | Discharge status: Home / Self Care | Payer: Medicaid Other | Attending: Family Medicine | Admitting: Family Medicine

## 2019-08-01 DIAGNOSIS — N390 Urinary tract infection, site not specified: Secondary | ICD-10-CM | POA: Diagnosis present

## 2019-08-01 DIAGNOSIS — Z113 Encounter for screening for infections with a predominantly sexual mode of transmission: Secondary | ICD-10-CM | POA: Insufficient documentation

## 2019-08-01 DIAGNOSIS — Z3202 Encounter for pregnancy test, result negative: Secondary | ICD-10-CM

## 2019-08-01 DIAGNOSIS — Z20822 Contact with and (suspected) exposure to covid-19: Secondary | ICD-10-CM | POA: Diagnosis present

## 2019-08-01 LAB — POCT URINALYSIS DIP (DEVICE)
Bilirubin Urine: NEGATIVE
Glucose, UA: NEGATIVE mg/dL
Ketones, ur: NEGATIVE mg/dL
Nitrite: POSITIVE — AB
Protein, ur: 30 mg/dL — AB
Specific Gravity, Urine: >=1.03 (ref 1.005–1.030)
Urobilinogen, UA: 1 mg/dL (ref 0.0–1.0)
pH: 6.5 (ref 5.0–8.0)

## 2019-08-01 LAB — POC URINE PREG, ED: Preg Test, Ur: NEGATIVE

## 2019-08-01 LAB — POCT PREGNANCY, URINE: Preg Test, Ur: NEGATIVE

## 2019-08-01 MED ORDER — NITROFURANTOIN MONOHYD MACRO 100 MG PO CAPS: 100.0000 mg | ORAL_CAPSULE | Freq: Two times a day (BID) | ORAL | 0 refills | Status: AC

## 2019-08-01 NOTE — ED Triage Notes (Signed)
Symptoms started 2-3 days ago.  Reports need to urinate frequently.  Denies burning with urination.  Denies abdominal pain, denies back pain.    Wants to be tested for stds.  Requests covid testing

## 2019-08-01 NOTE — Discharge Instructions (Signed)
Urine showed evidence of infection. We are treating you with macrobid twice daily for 5 days. Be sure to take full course. Stay hydrated- urine should be pale yellow to clear.  COVID and Vaginal swab pending  Please return or follow up with your primary provider if symptoms not improving with treatment. Please return sooner if you have worsening of symptoms or develop fever, nausea, vomiting, abdominal pain, back pain, lightheadedness, dizziness.

## 2019-08-02 NOTE — ED Provider Notes (Signed)
[facility]    CSN: [id number] Arrival date & time: 08/01/19  1659      History   Chief Complaint Chief Complaint  Patient presents with  . Urinary Tract Infection    HPI Valerie Newman is a 19 y.o. female presenting today for evaluation of possible UTI, STD screening for Covid screening.  Patient states that over the past 2 to 3 days she has had some urinary frequency and urgency.  She notes that she has been drinking more sodas of recently and is concerned about possible UTI.  She has had a few prior UTIs.  She denies any abdominal pain nausea or vomiting.  Denies back pain.  Would like to be screened for STDs.  She notes that after her cycle she has had a darker brown discharge.  She denies associating itching or irritation.  Denies associated odor.  Has noted irregular frequent menstrual cycles of recently.  Denies any specific known exposures to STD.  Last menstrual cycle was 2/5.  Uses IUD for birth control, placed October 2020.  Patient is also requesting Covid screening.  Denies exposure or symptoms.  HPI  Past Medical History:  Diagnosis Date  . 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 Allergy to pollen   . Anemia   . Anxiety   . Asthma     Patient Active Problem List   Diagnosis Date Noted  . Nexplanon insertion   . Chlamydia infection 10/15/2017    Past Surgical History:  Procedure Laterality Date  . TOOTH EXTRACTION      OB History    Gravida  1   Para  1   Term  1   Preterm  0   AB  0   Living  1     SAB  0   TAB  0   Ectopic  0   Multiple  0   Live Births  1            Home Medications    Prior to Admission medications   Medication Sig Start Date End Date Taking? Authorizing Provider  ibuprofen (ADVIL) 800 MG tablet Take 1 tablet (800 mg total) by mouth 3 (three) times daily. 06/28/19   Niala Stcharles C, PA-C  nitrofurantoin, macrocrystal-monohydrate, (MACROBID) 100 MG capsule Take 1 capsule (100 mg total) by mouth 2  (two) times daily for 5 days. 08/01/19 08/06/19  Sukhdeep Wieting, Junius Creamer, PA-C    Family History Family History  Problem Relation Age of Onset  . Heart disease Mother   . Stroke Mother   . Diabetes Maternal Grandmother     Social History Social History   Tobacco Use  . Smoking status: Never Smoker  . Smokeless tobacco: Never Used  Substance Use Topics  . Alcohol use: No  . Drug use: No     Allergies   Bee venom and Wasp venom   Review of Systems Review of Systems  Constitutional: Negative for activity change, appetite change, chills, fatigue and fever.  HENT: Negative for congestion, ear pain, rhinorrhea, sinus pressure, sore throat and trouble swallowing.   Eyes: Negative for discharge and redness.  Respiratory: Negative for cough, chest tightness and shortness of breath.   Cardiovascular: Negative for chest pain.  Gastrointestinal: Negative for abdominal pain, diarrhea, nausea and vomiting.  Genitourinary: Positive for frequency, urgency and vaginal discharge. Negative for dysuria, flank pain, genital sores, hematuria, menstrual problem, vaginal bleeding and vaginal pain.  Musculoskeletal: Negative for back pain  and myalgias.  Skin: Negative for rash.  Neurological: Negative for dizziness, light-headedness and headaches.     Physical Exam Triage Vital Signs ED Triage Vitals  Enc Vitals Group     BP 08/01/19 1809 113/74     Pulse Rate 08/01/19 1809 74     Resp 08/01/19 1809 16     Temp 08/01/19 1809 98.5 F (36.9 C)     Temp Source 08/01/19 1809 Oral     SpO2 08/01/19 1809 100 %     Weight --      Height --      Head Circumference --      Peak Flow --      Pain Score 08/01/19 1807 0     Pain Loc --      Pain Edu? --      Excl. in [place]? --    No data found.  Updated Vital Signs BP 113/74 (BP Location: Left Arm)   Pulse 74   Temp 98.5 F (36.9 C) (Oral)   Resp 16   SpO2 100%   Visual Acuity Right Eye Distance:   Left Eye Distance:   Bilateral Distance:     Right Eye Near:   Left Eye Near:    Bilateral Near:     Physical Exam Vitals and nursing note reviewed.  Constitutional:      Appearance: She is well-developed.     Comments: No acute distress  HENT:     Head: Normocephalic and atraumatic.     Nose: Nose normal.  Eyes:     Conjunctiva/sclera: Conjunctivae normal.  Cardiovascular:     Rate and Rhythm: Normal rate.  Pulmonary:     Effort: Pulmonary effort is normal. No respiratory distress.  Abdominal:     General: There is no distension.     Comments: Soft, nondistended, nontender to light deep palpation throughout abdomen  Musculoskeletal:        General: Normal range of motion.     Cervical back: Neck supple.  Skin:    General: Skin is warm and dry.  Neurological:     Mental Status: She is alert and oriented to person, place, and time.      UC Treatments / Results  Labs (all labs ordered are listed, but only abnormal results are displayed) Labs Reviewed  POCT URINALYSIS DIP (DEVICE) - Abnormal; Notable for the following components:      Result Value   Hgb urine dipstick TRACE (*)    Protein, ur 30 (*)    Nitrite POSITIVE (*)    Leukocytes,Ua TRACE (*)    All other components within normal limits  NOVEL CORONAVIRUS, NAA (HOSP ORDER, SEND-OUT TO REF LAB; TAT 18-24 HRS)  URINE CULTURE  POC URINE PREG, ED  POCT PREGNANCY, URINE  CERVICOVAGINAL ANCILLARY ONLY    EKG   Radiology No results found.  Procedures Procedures (including critical care time)  Medications Ordered in UC Medications - No data to display  Initial Impression / Assessment and Plan / UC Course  I have reviewed the triage vital signs and the nursing notes.  Pertinent labs & imaging results that were available during my care of the patient were reviewed by me and considered in my medical decision making (see chart for details).     Pregnancy test negative, trace leuks with positive nitrites, will empirically treat for UTI today with  Macrobid twice daily x5 days.  Urine culture pending.  Vaginal swab also pending to evaluate for abnormal discharge  and STDs.  Will call with results and provide further treatment as needed.  Covid PCR pending.  Recommended following up with OB/GYN if continuing to have concerns around irregular cycles, discussed this may be normal for IUD.  Discussed strict return precautions. Patient verbalized understanding and is agreeable with plan.  Final Clinical Impressions(s) / UC Diagnoses   Final diagnoses:  Lower urinary tract infectious disease  Screen for STD (sexually transmitted disease)  Encounter for laboratory testing for COVID-19 virus     Discharge Instructions     Urine showed evidence of infection. We are treating you with macrobid twice daily for 5 days. Be sure to take full course. Stay hydrated- urine should be pale yellow to clear.  COVID and Vaginal swab pending  Please return or follow up with your primary provider if symptoms not improving with treatment. Please return sooner if you have worsening of symptoms or develop fever, nausea, vomiting, abdominal pain, back pain, lightheadedness, dizziness.   ED Prescriptions    Medication Sig Dispense Auth. Provider   nitrofurantoin, macrocrystal-monohydrate, (MACROBID) 100 MG capsule Take 1 capsule (100 mg total) by mouth 2 (two) times daily for 5 days. 10 capsule Ailea Rhatigan, [place] C, PA-C     PDMP not reviewed this encounter.   Janith Lima, PA-C 08/02/19 1134

## 2019-08-03 LAB — URINE CULTURE: Culture: >=100000 — AB

## 2019-08-03 LAB — NOVEL CORONAVIRUS, NAA (HOSP ORDER, SEND-OUT TO REF LAB; TAT 18-24 HRS): SARS-CoV-2, NAA: NOT DETECTED

## 2019-08-03 LAB — CERVICOVAGINAL ANCILLARY ONLY
Bacterial vaginitis: NEGATIVE
Candida vaginitis: NEGATIVE
Chlamydia: NEGATIVE
Neisseria Gonorrhea: NEGATIVE
Trichomonas: NEGATIVE

## 2019-08-14 ENCOUNTER — Emergency Department (HOSPITAL_COMMUNITY)
Admission: EM | Admit: 2019-08-14 | Discharge: 2019-08-14 | Disposition: A | Discharge status: Home / Self Care | Payer: Medicaid Other | Attending: Emergency Medicine | Admitting: Emergency Medicine

## 2019-08-14 ENCOUNTER — Encounter (HOSPITAL_COMMUNITY): Payer: Self-pay | Admitting: Emergency Medicine

## 2019-08-14 ENCOUNTER — Emergency Department (HOSPITAL_COMMUNITY): Payer: Medicaid Other

## 2019-08-14 DIAGNOSIS — J45909 Unspecified asthma, uncomplicated: Secondary | ICD-10-CM | POA: Diagnosis not present

## 2019-08-14 DIAGNOSIS — R102 Pelvic and perineal pain: Secondary | ICD-10-CM

## 2019-08-14 DIAGNOSIS — Z975 Presence of (intrauterine) contraceptive device: Secondary | ICD-10-CM | POA: Insufficient documentation

## 2019-08-14 DIAGNOSIS — Z791 Long term (current) use of non-steroidal anti-inflammatories (NSAID): Secondary | ICD-10-CM | POA: Diagnosis not present

## 2019-08-14 DIAGNOSIS — N83201 Unspecified ovarian cyst, right side: Secondary | ICD-10-CM | POA: Insufficient documentation

## 2019-08-14 DIAGNOSIS — N898 Other specified noninflammatory disorders of vagina: Secondary | ICD-10-CM | POA: Insufficient documentation

## 2019-08-14 DIAGNOSIS — N739 Female pelvic inflammatory disease, unspecified: Secondary | ICD-10-CM

## 2019-08-14 DIAGNOSIS — R1031 Right lower quadrant pain: Secondary | ICD-10-CM | POA: Diagnosis present

## 2019-08-14 LAB — URINALYSIS, ROUTINE W REFLEX MICROSCOPIC
Bilirubin Urine: NEGATIVE
Glucose, UA: NEGATIVE mg/dL
Hgb urine dipstick: NEGATIVE
Ketones, ur: NEGATIVE mg/dL
Leukocytes,Ua: NEGATIVE
Nitrite: NEGATIVE
Protein, ur: NEGATIVE mg/dL
Specific Gravity, Urine: 1.024 (ref 1.005–1.030)
pH: 6 (ref 5.0–8.0)

## 2019-08-14 LAB — COMPREHENSIVE METABOLIC PANEL
ALT: 10 U/L (ref 0–44)
AST: 20 U/L (ref 15–41)
Albumin: 3.7 g/dL (ref 3.5–5.0)
Alkaline Phosphatase: 52 U/L (ref 38–126)
Anion gap: 9 (ref 5–15)
BUN: 7 mg/dL (ref 6–20)
CO2: 22 mmol/L (ref 22–32)
Calcium: 8.9 mg/dL (ref 8.9–10.3)
Chloride: 106 mmol/L (ref 98–111)
Creatinine, Ser: 0.64 mg/dL (ref 0.44–1.00)
GFR calc Af Amer: >60 mL/min (ref >60)
GFR calc non Af Amer: >60 mL/min (ref >60)
Glucose, Bld: 82 mg/dL (ref 70–99)
Potassium: 3.8 mmol/L (ref 3.5–5.1)
Sodium: 137 mmol/L (ref 135–145)
Total Bilirubin: 0.7 mg/dL (ref 0.3–1.2)
Total Protein: 6.6 g/dL (ref 6.5–8.1)

## 2019-08-14 LAB — CBC
HCT: 31.1 % — ABNORMAL LOW (ref 36.0–46.0)
Hemoglobin: 9.6 g/dL — ABNORMAL LOW (ref 12.0–15.0)
MCH: 24.7 pg — ABNORMAL LOW (ref 26.0–34.0)
MCHC: 30.9 g/dL (ref 30.0–36.0)
MCV: 80.2 fL (ref 80.0–100.0)
Platelets: 247 10*3/uL (ref 150–400)
RBC: 3.88 MIL/uL (ref 3.87–5.11)
RDW: 15.6 % — ABNORMAL HIGH (ref 11.5–15.5)
WBC: 6.1 10*3/uL (ref 4.0–10.5)
nRBC: 0 % (ref 0.0–0.2)

## 2019-08-14 LAB — HIV ANTIBODY (ROUTINE TESTING W REFLEX): HIV Screen 4th Generation wRfx: NONREACTIVE

## 2019-08-14 LAB — WET PREP, GENITAL
Clue Cells Wet Prep HPF POC: NONE SEEN
Sperm: NONE SEEN
Trich, Wet Prep: NONE SEEN
Yeast Wet Prep HPF POC: NONE SEEN

## 2019-08-14 LAB — I-STAT BETA HCG BLOOD, ED (MC, WL, AP ONLY): I-stat hCG, quantitative: <5 m[IU]/mL (ref <5)

## 2019-08-14 LAB — LIPASE, BLOOD: Lipase: 22 U/L (ref 11–51)

## 2019-08-14 MED ORDER — SODIUM CHLORIDE 0.9% FLUSH: 3.0000 mL | Freq: Once | INTRAVENOUS | Status: DC

## 2019-08-14 MED ORDER — DOXYCYCLINE HYCLATE 100 MG PO CAPS: 100.0000 mg | ORAL_CAPSULE | Freq: Two times a day (BID) | ORAL | 0 refills | Status: DC

## 2019-08-14 MED ORDER — CEFTRIAXONE SODIUM 500 MG IJ SOLR
500.0000 mg | Freq: Once | INTRAMUSCULAR | Status: AC
  Administered 2019-08-14: 500 mg via INTRAMUSCULAR
  Filled 2019-08-14: qty 500

## 2019-08-14 MED ORDER — NAPROXEN 500 MG PO TABS: 500.0000 mg | ORAL_TABLET | Freq: Two times a day (BID) | ORAL | 0 refills | Status: DC

## 2019-08-14 NOTE — ED Triage Notes (Signed)
Pt reports RLQ pain for 1 week. Pt states this pain gets worse if her bladder is full or she has a BM. Pt denies any n/v/d. Pt denies any fever.

## 2019-08-14 NOTE — ED Notes (Signed)
Pt instructed on discharge education, safe sex practices, and medication regimen. Pt verbalized understanding.

## 2019-08-14 NOTE — ED Provider Notes (Signed)
[facility] EMERGENCY DEPARTMENT Provider Note   CSN: [id number] Arrival date & time: 08/14/19  1111     History Chief Complaint  Patient presents with  . Abdominal Pain    Valerie Newman is a 19 y.o. female with a history of ovarian cysts, anemia, chlamydia, G1P1, IUD in place who presents to the ED with complaints of abdominal pain x 1 week. States pain is the RLQ  intermittently, worse when her bladder is full, when she has a BM, and when she sits up straight. Pain worsened today for a 9/10 in severity. Reports associated white vaginal discharge x 3 days. Unprotected sex February 5th, tested 1 week later with negative STD test results. LMP 07/21/19. Denies fever, chills, nausea, vomiting, diarrhea, dysuria, frequency, or urgency.   HPI     Past Medical History:  Diagnosis Date  . 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 Allergy to pollen   . Anemia   . Anxiety   . Asthma     Patient Active Problem List   Diagnosis Date Noted  . Nexplanon insertion   . Chlamydia infection 10/15/2017    Past Surgical History:  Procedure Laterality Date  . TOOTH EXTRACTION       OB History    Gravida  1   Para  1   Term  1   Preterm  0   AB  0   Living  1     SAB  0   TAB  0   Ectopic  0   Multiple  0   Live Births  1           Family History  Problem Relation Age of Onset  . Heart disease Mother   . Stroke Mother   . Diabetes Maternal Grandmother     Social History   Tobacco Use  . Smoking status: Never Smoker  . Smokeless tobacco: Never Used  Substance Use Topics  . Alcohol use: No  . Drug use: No    Home Medications Prior to Admission medications   Medication Sig Start Date End Date Taking? Authorizing Provider  ibuprofen (ADVIL) 800 MG tablet Take 1 tablet (800 mg total) by mouth 3 (three) times daily. 06/28/19   Wieters, Hallie C, PA-C    Allergies    Bee venom and Wasp venom  Review of Systems   Review of Systems    Constitutional: Negative for chills and fever.  Respiratory: Negative for shortness of breath.   Cardiovascular: Negative for chest pain.  Gastrointestinal: Positive for abdominal pain. Negative for anal bleeding, blood in stool, diarrhea, nausea and vomiting.  Genitourinary: Positive for vaginal discharge. Negative for dysuria, frequency, urgency and vaginal bleeding.  Skin: Negative for rash.  Neurological: Negative for syncope.  All other systems reviewed and are negative.   Physical Exam Updated Vital Signs BP 116/69 (BP Location: Right Arm)   Pulse 60   Temp 98.1 F (36.7 C) (Oral)   Resp 17   Ht 5\' 2"  (1.575 m)   Wt 59 kg   LMP 07/25/2019   SpO2 100%   BMI 23.78 kg/m   Physical Exam Vitals and nursing note reviewed. Exam conducted with a chaperone present.  Constitutional:      General: She is not in acute distress.    Appearance: She is well-developed. She is not toxic-appearing.  HENT:     Head: Normocephalic and atraumatic.  Eyes:     General:  Right eye: No discharge.        Left eye: No discharge.     Conjunctiva/sclera: Conjunctivae normal.  Cardiovascular:     Rate and Rhythm: Normal rate and regular rhythm.  Pulmonary:     Effort: Pulmonary effort is normal. No respiratory distress.     Breath sounds: Normal breath sounds. No wheezing, rhonchi or rales.  Abdominal:     General: There is no distension.     Palpations: Abdomen is soft.     Tenderness: There is abdominal tenderness (R suprapbuic). There is no guarding or rebound. Negative signs include Rovsing's sign, McBurney's sign, psoas sign and obturator sign.  Genitourinary:    Labia:        Right: No lesion.        Left: No lesion.      Vagina: Vaginal discharge present.     Cervix: Discharge and friability present. No cervical motion tenderness.     Adnexa:        Right: Tenderness present. No mass or fullness.         Left: No mass, tenderness or fullness.       Comments: Thick white  discharge present. IUD strings visualized at cervical os. Musculoskeletal:     Cervical back: Neck supple.  Skin:    General: Skin is warm and dry.     Findings: No rash.  Neurological:     Mental Status: She is alert.     Comments: Clear speech.   Psychiatric:        Behavior: Behavior normal.    ED Results / Procedures / Treatments   Labs (all labs ordered are listed, but only abnormal results are displayed) Labs Reviewed  WET PREP, GENITAL - Abnormal; Notable for the following components:      Result Value   WBC, Wet Prep HPF POC MANY (*)    All other components within normal limits  CBC - Abnormal; Notable for the following components:   Hemoglobin 9.6 (*)    HCT 31.1 (*)    MCH 24.7 (*)    RDW 15.6 (*)    All other components within normal limits  LIPASE, BLOOD  COMPREHENSIVE METABOLIC PANEL  URINALYSIS, ROUTINE W REFLEX MICROSCOPIC  RPR  HIV ANTIBODY (ROUTINE TESTING W REFLEX)  I-STAT BETA HCG BLOOD, ED ([facility], [facility], [facility] ONLY)  GC/CHLAMYDIA PROBE AMP ([place]) NOT AT [facility]    EKG None  Radiology US PELVIC COMPLETE W TRANSVAGINAL AND TORSION R/O  Result Date: 08/14/2019 CLINICAL DATA:  Pelvic pain for 1 week. EXAM: TRANSABDOMINAL AND TRANSVAGINAL ULTRASOUND OF PELVIS DOPPLER ULTRASOUND OF OVARIES TECHNIQUE: Both transabdominal and transvaginal ultrasound examinations of the pelvis were performed. Transabdominal technique was performed for global imaging of the pelvis including uterus, ovaries, adnexal regions, and pelvic cul-de-sac. It was necessary to proceed with endovaginal exam following the transabdominal exam to visualize the uterus and adnexa. Color and duplex Doppler ultrasound was utilized to evaluate blood flow to the ovaries. COMPARISON:  None. FINDINGS: Uterus Measurements: 9.7 x 4.8 x 7.1 cm = volume: 171.5 mL. No fibroids or other mass visualized. Endometrium Thickness: 0.7 cm. No focal abnormality visualized. IUD is in place. Right ovary Measurements: 4.4 x  2.6 x 3.4 cm = volume: 20.1 mL. There is a cyst measuring 2.4 x 1.1 x 2.0 cm which has somewhat lobulated border suggestive of involution. No adnexal mass. Left ovary Measurements: 4.1 x 1.7 x 3.3 cm = volume: 12.0 mL. Normal appearance/no adnexal mass. Pulsed  Doppler evaluation of both ovaries demonstrates normal low-resistance arterial and venous waveforms. Other findings Small amount of free pelvic fluid noted. IMPRESSION: The no acute abnormality. Small right ovarian cyst appears to be involuting. Electronically Signed   By: Drusilla Kanner M.D.   On: 08/14/2019 14:58    Procedures Procedures (including critical care time)  Medications Ordered in ED Medications  sodium chloride flush (NS) 0.9 % injection 3 mL (has no administration in time range)    ED Course  I have reviewed the triage vital signs and the nursing notes.  Pertinent labs & imaging results that were available during my care of the patient were reviewed by me and considered in my medical decision making (see chart for details).    MDM Rules/Calculators/A&P                     Patient presents to the ED with complaints of abdominal pain. Patient nontoxic appearing, in no apparent distress, vitals WNL. On exam patient tender to R suprapubic area, no peritoneal signs, as well as to the R adnexa with discharge noted. Will evaluate with labs and pelvic US.   ER work-up reviewed & interpreted personally.   CBC: Anemia similar to prior on chart review. No leukocytosis.  CMP: No significant electrolyte derangement.  Renal function and LFTs WNL. Lipase: WNL. UA: No UTI. Preg test: Negative. Wet prep: Many WBCs.  No yeast, trichomoniasis, or BV. Pelvic ultrasound: The no acute abnormality. Small right ovarian cyst appears to be involuting  Patient's tenderness palpation is more in the right suprapubic area as well as the right adnexa, no tenderness specifically over McBurney's point, negative Rovsing's, negative psoas, negative  obturator, do not suspect appendicitis.  Repeat abdominal exam remains without peritoneal signs do not suspect cholecystitis, perf, obstruction, or ectopic pregnancy.  Ultrasound with small right ovarian cyst which may be contributing to patient's symptoms, no torsion.  Given she has many WBCs on wet prep, vaginal discharge, and adnexal tenderness do feel she warrants treatment for pelvic inflammatory disease.  She is afebrile, without leukocytosis, not septic or toxic appearing, appears appropriate for outpatient antibiotics.  Will give IM Rocephin in the emergency department and treat with doxycycline.  NSAIDs provided to help with pain. I discussed results, treatment plan, need for follow-up, and return precautions with the patient. Provided opportunity for questions, patient confirmed understanding and is in agreement with plan.    Final Clinical Impression(s) / ED Diagnoses Final diagnoses:  Pelvic inflammatory disease  Cyst of right ovary    Rx / DC Orders ED Discharge Orders         Ordered    doxycycline (VIBRAMYCIN) 100 MG capsule  2 times daily     08/14/19 1550    naproxen (NAPROSYN) 500 MG tablet  2 times daily     08/14/19 [address], [place], PA-C 08/14/19 1552    Raeford Razor, MD 08/15/19 [phone]

## 2019-08-14 NOTE — Discharge Instructions (Addendum)
You were seen in the emergency department today for pelvic pain.  Your ultrasound showed a small right ovarian cyst.  Your labs were overall reassuring.  You do have anemia but this is similar to your prior blood work you have had done.  We are concerned that she may have a pelvic infection known as pelvic inflammatory disease.  We are sending you home with the following medicines: -Doxycycline: This is an antibiotic to treat infection, please take this as prescribed. - Naproxen is a nonsteroidal anti-inflammatory medication that will help with pain and swelling. Be sure to take this medication as prescribed with food, 1 pill every 12 hours,  It should be taken with food, as it can cause stomach upset, and more seriously, stomach bleeding. Do not take other nonsteroidal anti-inflammatory medications with this such as Advil, Motrin, Aleve, Mobic, Goodie Powder, or Motrin.    You make take Tylenol per over the counter dosing with these medications.   We have prescribed you new medication(s) today. Discuss the medications prescribed today with your pharmacist as they can have adverse effects and interactions with your other medicines including over the counter and prescribed medications. Seek medical evaluation if you start to experience new or abnormal symptoms after taking one of these medicines, seek care immediately if you start to experience difficulty breathing, feeling of your throat closing, facial swelling, or rash as these could be indications of a more serious allergic reaction  We have tested you for gonorrhea, chlamydia, HIV, and syphilis, if any of these test results return positive you will need to inform all sexual partners.  Please follow-up with your OB/GYN doctor within 3 days for reevaluation.  Return to the emergency department for new or worsening symptoms including but not limited to increased pain, fever, inability to keep fluids down, or any other concerns.

## 2019-08-15 LAB — GC/CHLAMYDIA PROBE AMP (~~LOC~~) NOT AT ARMC
Chlamydia: NEGATIVE
Neisseria Gonorrhea: NEGATIVE

## 2019-08-15 LAB — RPR: RPR Ser Ql: NONREACTIVE

## 2019-08-31 ENCOUNTER — Encounter: Payer: Self-pay | Admitting: Obstetrics and Gynecology

## 2019-08-31 ENCOUNTER — Encounter: Payer: Self-pay | Admitting: General Practice

## 2019-08-31 ENCOUNTER — Other Ambulatory Visit: Payer: Self-pay

## 2019-08-31 ENCOUNTER — Ambulatory Visit (INDEPENDENT_AMBULATORY_CARE_PROVIDER_SITE_OTHER): Payer: Medicaid Other | Admitting: Obstetrics and Gynecology

## 2019-08-31 VITALS — BP 107/70 | HR 89 | Temp 97.5°F | Ht 64.0 in | Wt 129.2 lb

## 2019-08-31 DIAGNOSIS — Z30432 Encounter for removal of intrauterine contraceptive device: Secondary | ICD-10-CM

## 2019-08-31 DIAGNOSIS — Z3040 Encounter for surveillance of contraceptives, unspecified: Secondary | ICD-10-CM

## 2019-08-31 NOTE — Progress Notes (Signed)
   IUD Removal Procedure Note   Patient is 19 y.o. G1P1001 who is here for IUD removal. She would like IUD removed secondary to persistent abdominal and "cysts on her ovaries". She has had multiple issues with IUD. She understands that she could get pregnant after removal of IUD if she does not use another form of contraception. She states she is not currently sexually active and has no plans on becoming pregnant again. She has no other complaints today. Reviewed risks of removal including pain, bleeding, difficult removal and inability to remove IUD which may require surgical removal in OR. She affirms that she would like IUD removed.  BP 107/70 (BP Location: Right Arm, Patient Position: Sitting, Cuff Size: Normal)   Pulse 89   Temp (!) 97.5 F (36.4 C) (Oral)   Ht 5\' 4"  (1.626 m)   Wt 129 lb 3.2 oz (58.6 kg)   LMP 08/21/2019 (Exact Date)   Breastfeeding No   BMI 22.18 kg/m   Patient with normal appearing external female genitalia. Graves speculum placed in vagina and IUD strings easily visualized. Strings grasped with ring forceps and removed easily. Minimal bleeding noted. All instruments removed from vagina. Patient tolerated procedure very well.    She was given post removal instructions. She is planning on using nothing for contraception. She reports not being sexually active since 2020 and has no future plans of getting pregnant again." Instructed to call our office before becoming sexually active to get [facility]. Agreed with plan.  Return 1 year for annual with pap or prn.   [facility] MSN, CNM 08/31/2019 10:58 AM

## 2019-08-31 NOTE — Patient Instructions (Signed)
Please call our office at [phone] with any questions or concerns. If you get an email survey about your experience today please consider completing it to let us know what we are doing right or how we can improve. Thank you for allowing [place] to care for you today.

## 2019-09-06 ENCOUNTER — Ambulatory Visit (INDEPENDENT_AMBULATORY_CARE_PROVIDER_SITE_OTHER): Payer: Medicaid Other | Admitting: Primary Care

## 2019-09-06 ENCOUNTER — Other Ambulatory Visit: Payer: Self-pay

## 2019-09-06 ENCOUNTER — Encounter (INDEPENDENT_AMBULATORY_CARE_PROVIDER_SITE_OTHER): Payer: Self-pay | Admitting: Primary Care

## 2019-09-06 DIAGNOSIS — N921 Excessive and frequent menstruation with irregular cycle: Secondary | ICD-10-CM | POA: Diagnosis not present

## 2019-09-06 MED ORDER — IBUPROFEN 800 MG PO TABS: 800.0000 mg | ORAL_TABLET | Freq: Three times a day (TID) | ORAL | 0 refills | Status: DC | PRN

## 2019-09-06 NOTE — Progress Notes (Signed)
Virtual Visit via Telephone Note  I connected with Valerie Newman on 09/06/19 at  2:50 PM EDT by telephone and verified that I am speaking with the correct person using two identifiers.   I discussed the limitations, risks, security and privacy concerns of performing an evaluation and management service by telephone and the availability of in person appointments. I also discussed with the patient that there may be a patient responsible charge related to this service. The patient expressed understanding and agreed to proceed.   History of Present Illness: Ms.Valerie Newman is a 19 year old African American that is having a tele visit with concerns with abnormal menstrual cycle heavy and passing a lot of clots. On 08/31/2019 patient had IUD removed.  Past Medical History:  Diagnosis Date  . 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 Allergy to pollen   . Anemia   . Anxiety   . Asthma    No current outpatient medications on file prior to visit.   No current facility-administered medications on file prior to visit.   Observations/Objective: Review of Systems  Genitourinary:       Heavy menstrual cycles after removal of birth control IUD  All other systems reviewed and are negative.  Assessment and Plan: Valerie Newman was seen today for menorrhagia.  Diagnoses and all orders for this visit:  Menorrhagia with irregular cycle Discussed this was hormonal cycles may not be regulated for several months but if this problems continues refer her back to [facility]   Other orders -     ibuprofen (ADVIL) 800 MG tablet; Take 1 tablet (800 mg total) by mouth every 8 (eight) hours as needed.    Follow Up Instructions:    I discussed the assessment and treatment plan with the patient. The patient was provided an opportunity to ask questions and all were answered. The patient agreed with the plan and demonstrated an understanding of the instructions.   The patient was advised  to call back or seek an in-person evaluation if the symptoms worsen or if the condition fails to improve as anticipated.  I provided 10 minutes of non-face-to-face time during this encounter.   [name], NP

## 2019-09-18 ENCOUNTER — Ambulatory Visit (HOSPITAL_COMMUNITY)
Admission: EM | Admit: 2019-09-18 | Discharge: 2019-09-18 | Disposition: A | Discharge status: Home / Self Care | Payer: Medicaid Other | Attending: Family Medicine | Admitting: Family Medicine

## 2019-09-18 ENCOUNTER — Other Ambulatory Visit: Payer: Self-pay

## 2019-09-18 ENCOUNTER — Encounter (HOSPITAL_COMMUNITY): Payer: Self-pay

## 2019-09-18 DIAGNOSIS — N760 Acute vaginitis: Secondary | ICD-10-CM | POA: Diagnosis not present

## 2019-09-18 MED ORDER — NYSTATIN-TRIAMCINOLONE 100000-0.1 UNIT/GM-% EX CREA: TOPICAL_CREAM | CUTANEOUS | 0 refills | Status: DC

## 2019-09-18 NOTE — Discharge Instructions (Signed)
Use the cream  as prescribed Sending swab for testing.  We will call with any positive results.

## 2019-09-18 NOTE — ED Triage Notes (Signed)
Pt state she has vaginal irritation x 2 days.

## 2019-09-18 NOTE — ED Provider Notes (Signed)
[facility]    CSN: [id number] Arrival date & time: 09/18/19  7858      History   Chief Complaint Chief Complaint  Patient presents with  . vaginal irriation    HPI Antasia Haider is a 19 y.o. female.   Patient is a 19 year old female that presents today with vaginal itching, irritation.  This is been present for the past couple days.  She does have past medical history of chlamydia.  Reporting this started after having oral sex.  She also used different scented soap.  Reporting some white discharge.  Denies any associated abdominal pain, back pain, dysuria, hematuria or urinary frequency.  Currently not using anything for birth control. Patient's last menstrual period was 08/25/2019.  ROS per HPI       Past Medical History:  Diagnosis Date  . 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 Allergy to pollen   . Anemia   . Anxiety   . Asthma     Patient Active Problem List   Diagnosis Date Noted  . Nexplanon insertion   . Chlamydia infection 10/15/2017    Past Surgical History:  Procedure Laterality Date  . TOOTH EXTRACTION      OB History    Gravida  1   Para  1   Term  1   Preterm  0   AB  0   Living  1     SAB  0   TAB  0   Ectopic  0   Multiple  0   Live Births  1            Home Medications    Prior to Admission medications   Medication Sig Start Date End Date Taking? Authorizing Provider  ibuprofen (ADVIL) 800 MG tablet Take 1 tablet (800 mg total) by mouth every 8 (eight) hours as needed. 09/06/19   [name], NP  nystatin-triamcinolone (MYCOLOG II) cream Apply to affected area daily 09/18/19   Janace Aris, NP    Family History Family History  Problem Relation Age of Onset  . Heart disease Mother   . Stroke Mother   . Diabetes Maternal Grandmother     Social History Social History   Tobacco Use  . Smoking status: Never Smoker  . Smokeless tobacco: Never Used  Substance Use Topics  . Alcohol use: No    . Drug use: No     Allergies   Bee venom and Wasp venom   Review of Systems Review of Systems   Physical Exam Triage Vital Signs ED Triage Vitals  Enc Vitals Group     BP 09/18/19 0825 106/72     Pulse Rate 09/18/19 0825 88     Resp 09/18/19 0825 16     Temp 09/18/19 0825 98.5 F (36.9 C)     Temp Source 09/18/19 0825 Oral     SpO2 09/18/19 0825 100 %     Weight 09/18/19 0823 132 lb (59.9 kg)     Height --      Head Circumference --      Peak Flow --      Pain Score 09/18/19 0823 0     Pain Loc --      Pain Edu? --      Excl. in GC? --    No data found.  Updated Vital Signs BP 106/72 (BP Location: Right Arm)   Pulse 88   Temp 98.5 F (36.9 C) (Oral)  Resp 16   Wt 132 lb (59.9 kg)   LMP 08/25/2019   SpO2 100%   BMI 22.66 kg/m   Visual Acuity Right Eye Distance:   Left Eye Distance:   Bilateral Distance:    Right Eye Near:   Left Eye Near:    Bilateral Near:     Physical Exam Vitals and nursing note reviewed.  Constitutional:      General: She is not in acute distress.    Appearance: Normal appearance. She is not ill-appearing, toxic-appearing or diaphoretic.  HENT:     Head: Normocephalic.     Nose: Nose normal.  Eyes:     Conjunctiva/sclera: Conjunctivae normal.  Pulmonary:     Effort: Pulmonary effort is normal.  Musculoskeletal:        General: Normal range of motion.     Cervical back: Normal range of motion.  Skin:    General: Skin is warm and dry.     Findings: No rash.  Neurological:     Mental Status: She is alert.  Psychiatric:        Mood and Affect: Mood normal.      UC Treatments / Results  Labs (all labs ordered are listed, but only abnormal results are displayed) Labs Reviewed  CERVICOVAGINAL ANCILLARY ONLY    EKG   Radiology No results found.  Procedures Procedures (including critical care time)  Medications Ordered in UC Medications - No data to display  Initial Impression / Assessment and Plan / UC  Course  I have reviewed the triage vital signs and the nursing notes.  Pertinent labs & imaging results that were available during my care of the patient were reviewed by me and considered in my medical decision making (see chart for details).     Vaginitis- most likely BV or yeast  treating with mycolog cream for symptoms.  Sending swab for testing.  Labs pending.  Final Clinical Impressions(s) / UC Diagnoses   Final diagnoses:  Vaginitis and vulvovaginitis     Discharge Instructions     Use the cream  as prescribed Sending swab for testing.  We will call with any positive results.      ED Prescriptions    Medication Sig Dispense Auth. Provider   nystatin-triamcinolone (MYCOLOG II) cream Apply to affected area daily 15 g Jaquasha Carnevale A, NP     PDMP not reviewed this encounter.   Loura Halt A, NP 09/18/19 [phone]

## 2019-09-19 ENCOUNTER — Telehealth (HOSPITAL_COMMUNITY): Payer: Self-pay

## 2019-09-19 LAB — CERVICOVAGINAL ANCILLARY ONLY
Bacterial Vaginitis (gardnerella): NEGATIVE
Candida Glabrata: NEGATIVE
Candida Vaginitis: POSITIVE — AB
Chlamydia: NEGATIVE
Comment: NEGATIVE
Comment: NEGATIVE
Comment: NEGATIVE
Comment: NEGATIVE
Comment: NEGATIVE
Comment: NORMAL
Neisseria Gonorrhea: NEGATIVE
Trichomonas: NEGATIVE

## 2019-09-19 MED ORDER — FLUCONAZOLE 150 MG PO TABS: 150.0000 mg | ORAL_TABLET | Freq: Every day | ORAL | 0 refills | Status: AC

## 2019-10-09 ENCOUNTER — Encounter (HOSPITAL_COMMUNITY): Payer: Self-pay

## 2019-10-09 ENCOUNTER — Other Ambulatory Visit: Payer: Self-pay

## 2019-10-09 ENCOUNTER — Ambulatory Visit (HOSPITAL_COMMUNITY)
Admission: EM | Admit: 2019-10-09 | Discharge: 2019-10-09 | Disposition: A | Discharge status: Home / Self Care | Payer: Medicaid Other | Attending: Family Medicine | Admitting: Family Medicine

## 2019-10-09 DIAGNOSIS — Z3202 Encounter for pregnancy test, result negative: Secondary | ICD-10-CM

## 2019-10-09 DIAGNOSIS — N912 Amenorrhea, unspecified: Secondary | ICD-10-CM

## 2019-10-09 DIAGNOSIS — R110 Nausea: Secondary | ICD-10-CM | POA: Diagnosis not present

## 2019-10-09 DIAGNOSIS — R5383 Other fatigue: Secondary | ICD-10-CM

## 2019-10-09 LAB — POC URINE PREG, ED: Preg Test, Ur: NEGATIVE

## 2019-10-09 LAB — HCG, QUANTITATIVE, PREGNANCY: hCG, Beta Chain, Quant, S: <1 m[IU]/mL (ref <5)

## 2019-10-09 NOTE — ED Triage Notes (Signed)
Patient reports nausea and sensitivity to smells. Is worried she is pregnant.

## 2019-10-09 NOTE — Discharge Instructions (Signed)
Your urine pregnancy test was negative in the office today.   We will confirm with a blood test since you are so late for your period.  Results will be available via mychart.

## 2019-10-09 NOTE — ED Provider Notes (Signed)
[facility]    CSN: [id number] Arrival date & time: 10/09/19  1232      History   Chief Complaint Chief Complaint  Patient presents with  . Nausea  . needs pregnancy test    HPI Valerie Newman is a 19 y.o. female.   Patient reports that she has here for pregnancy test.  Reports that she has been very sensitive to smells lately.  She reports that they have been making her nauseated.  She reports fatigue lately.  She reports that these were the same symptoms that occurred when she was pregnant 2 years ago.  Requesting pregnancy test today.  Denies headache, sore throat, diarrhea, fever, rash, other symptoms.  ROS per HPI  The history is provided by the patient.    Past Medical History:  Diagnosis Date  . 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 Allergy to pollen   . Anemia   . Anxiety   . Asthma     Patient Active Problem List   Diagnosis Date Noted  . Nexplanon insertion   . Chlamydia infection 10/15/2017    Past Surgical History:  Procedure Laterality Date  . TOOTH EXTRACTION      OB History    Gravida  1   Para  1   Term  1   Preterm  0   AB  0   Living  1     SAB  0   TAB  0   Ectopic  0   Multiple  0   Live Births  1            Home Medications    Prior to Admission medications   Medication Sig Start Date End Date Taking? Authorizing Provider  ibuprofen (ADVIL) 800 MG tablet Take 1 tablet (800 mg total) by mouth every 8 (eight) hours as needed. 09/06/19   [name], NP  nystatin-triamcinolone (MYCOLOG II) cream Apply to affected area daily 09/18/19   Janace Aris, NP    Family History Family History  Problem Relation Age of Onset  . Heart disease Mother   . Stroke Mother   . Diabetes Maternal Grandmother     Social History Social History   Tobacco Use  . Smoking status: Never Smoker  . Smokeless tobacco: Never Used  Substance Use Topics  . Alcohol use: No  . Drug use: No     Allergies   Bee  venom and Wasp venom   Review of Systems Review of Systems   Physical Exam Triage Vital Signs ED Triage Vitals  Enc Vitals Group     BP 10/09/19 1302 (!) 143/83     Pulse Rate 10/09/19 1302 79     Resp 10/09/19 1302 14     Temp 10/09/19 1302 98.4 F (36.9 C)     Temp Source 10/09/19 1302 Oral     SpO2 10/09/19 1302 100 %     Weight --      Height --      Head Circumference --      Peak Flow --      Pain Score 10/09/19 1300 0     Pain Loc --      Pain Edu? --      Excl. in GC? --    No data found.  Updated Vital Signs BP (!) 143/83 (BP Location: Right Arm)   Pulse 79   Temp 98.4 F (36.9 C) (Oral)   Resp 14  LMP 09/02/2019 (Exact Date)   SpO2 100%   Visual Acuity Right Eye Distance:   Left Eye Distance:   Bilateral Distance:    Right Eye Near:   Left Eye Near:    Bilateral Near:     Physical Exam Vitals and nursing note reviewed.  Constitutional:      General: She is not in acute distress.    Appearance: Normal appearance. She is well-developed and normal weight. She is not ill-appearing.  HENT:     Head: Normocephalic and atraumatic.  Eyes:     Conjunctiva/sclera: Conjunctivae normal.  Cardiovascular:     Rate and Rhythm: Normal rate and regular rhythm.     Heart sounds: Normal heart sounds. No murmur.  Pulmonary:     Effort: Pulmonary effort is normal. No respiratory distress.     Breath sounds: Normal breath sounds. No stridor. No wheezing, rhonchi or rales.  Chest:     Chest wall: No tenderness.  Abdominal:     Palpations: Abdomen is soft.     Tenderness: There is no abdominal tenderness.  Musculoskeletal:        General: Normal range of motion.     Cervical back: Normal range of motion and neck supple.  Skin:    General: Skin is warm and dry.     Capillary Refill: Capillary refill takes less than 2 seconds.  Neurological:     General: No focal deficit present.     Mental Status: She is alert and oriented to person, place, and time.    Psychiatric:        Mood and Affect: Mood normal.        Behavior: Behavior normal.      UC Treatments / Results  Labs (all labs ordered are listed, but only abnormal results are displayed) Labs Reviewed  HCG, QUANTITATIVE, PREGNANCY  POC URINE PREG, ED    EKG   Radiology No results found.  Procedures Procedures (including critical care time)  Medications Ordered in UC Medications - No data to display  Initial Impression / Assessment and Plan / UC Course  I have reviewed the triage vital signs and the nursing notes.  Pertinent labs & imaging results that were available during my care of the patient were reviewed by me and considered in my medical decision making (see chart for details).     Nausea: Presenting with nausea for the last week or 2 per patient.  Urine pregnancy negative in office today.  Patient is also 20 days late for her period.  Given the late period, fatigue, increased sensitivity to smells we will go ahead and do hCG quantitative with blood.  Patient informed that results will be available on MyChart.  Patient has questions about these results, she may call us.  Patient instructed to follow-up with gynecology as needed. Final Clinical Impressions(s) / UC Diagnoses   Final diagnoses:  Negative pregnancy test  Nausea  Other fatigue  Amenorrhea     Discharge Instructions     Your urine pregnancy test was negative in the office today.   We will confirm with a blood test since you are so late for your period.  Results will be available via mychart.    ED Prescriptions    None     PDMP not reviewed this encounter.   Faustino Congress, NP 10/09/19 1530

## 2019-10-19 ENCOUNTER — Ambulatory Visit: Payer: Medicaid Other | Attending: Family

## 2019-10-19 DIAGNOSIS — Z23 Encounter for immunization: Secondary | ICD-10-CM

## 2019-10-19 NOTE — Progress Notes (Signed)
   Covid-19 Vaccination Clinic  Name:  Valerie Newman    MRN: [id number] DOB: 2000/09/10  10/19/2019  [name] was observed post Covid-19 immunization for 15 minutes without incident. She was provided with Vaccine Information Sheet and instruction to access the V-Safe system.   [name] was instructed to call 911 with any severe reactions post vaccine: Marland Kitchen Difficulty breathing  . Swelling of face and throat  . A fast heartbeat  . A bad rash all over body  . Dizziness and weakness   Immunizations Administered    Name Date Dose VIS Date Route   Moderna COVID-19 Vaccine 10/19/2019  1:18 PM 0.5 mL 05/2019 Intramuscular   Manufacturer: Moderna   Lot: [id number]   NDC: 89022-840-69

## 2019-10-27 ENCOUNTER — Other Ambulatory Visit (INDEPENDENT_AMBULATORY_CARE_PROVIDER_SITE_OTHER): Payer: Self-pay | Admitting: Primary Care

## 2019-10-27 DIAGNOSIS — K649 Unspecified hemorrhoids: Secondary | ICD-10-CM

## 2019-10-27 MED ORDER — HYDROCORTISONE ACETATE 25 MG RE SUPP: 25.0000 mg | Freq: Two times a day (BID) | RECTAL | 1 refills | Status: DC

## 2019-10-27 MED ORDER — HYDROCORTISONE ACETATE 25 MG RE SUPP: 25.0000 mg | Freq: Two times a day (BID) | RECTAL | 3 refills | Status: DC

## 2019-11-14 ENCOUNTER — Encounter (INDEPENDENT_AMBULATORY_CARE_PROVIDER_SITE_OTHER): Payer: Medicaid Other | Admitting: Primary Care

## 2019-11-14 ENCOUNTER — Encounter (INDEPENDENT_AMBULATORY_CARE_PROVIDER_SITE_OTHER): Payer: Self-pay | Admitting: Primary Care

## 2019-11-14 ENCOUNTER — Other Ambulatory Visit: Payer: Self-pay

## 2019-11-14 ENCOUNTER — Encounter (INDEPENDENT_AMBULATORY_CARE_PROVIDER_SITE_OTHER): Payer: Self-pay

## 2019-11-20 NOTE — Progress Notes (Signed)
error 

## 2019-11-21 ENCOUNTER — Ambulatory Visit (HOSPITAL_COMMUNITY)
Admission: EM | Admit: 2019-11-21 | Discharge: 2019-11-21 | Disposition: A | Discharge status: Home / Self Care | Payer: Medicaid Other | Attending: Emergency Medicine | Admitting: Emergency Medicine

## 2019-11-21 ENCOUNTER — Ambulatory Visit: Payer: Medicaid Other | Attending: Family

## 2019-11-21 ENCOUNTER — Encounter (HOSPITAL_COMMUNITY): Payer: Self-pay

## 2019-11-21 ENCOUNTER — Ambulatory Visit (INDEPENDENT_AMBULATORY_CARE_PROVIDER_SITE_OTHER): Payer: Medicaid Other | Admitting: Primary Care

## 2019-11-21 ENCOUNTER — Other Ambulatory Visit: Payer: Self-pay

## 2019-11-21 DIAGNOSIS — Z113 Encounter for screening for infections with a predominantly sexual mode of transmission: Secondary | ICD-10-CM | POA: Insufficient documentation

## 2019-11-21 DIAGNOSIS — Z23 Encounter for immunization: Secondary | ICD-10-CM

## 2019-11-21 DIAGNOSIS — N760 Acute vaginitis: Secondary | ICD-10-CM | POA: Insufficient documentation

## 2019-11-21 LAB — HIV ANTIBODY (ROUTINE TESTING W REFLEX): HIV Screen 4th Generation wRfx: NONREACTIVE

## 2019-11-21 LAB — POCT URINALYSIS DIP (DEVICE)
Bilirubin Urine: NEGATIVE
Glucose, UA: NEGATIVE mg/dL
Hgb urine dipstick: NEGATIVE
Ketones, ur: NEGATIVE mg/dL
Nitrite: NEGATIVE
Protein, ur: 30 mg/dL — AB
Specific Gravity, Urine: >=1.03 (ref 1.005–1.030)
Urobilinogen, UA: 1 mg/dL (ref 0.0–1.0)
pH: 6.5 (ref 5.0–8.0)

## 2019-11-21 LAB — POC URINE PREG, ED: Preg Test, Ur: NEGATIVE

## 2019-11-21 MED ORDER — FLUCONAZOLE 150 MG PO TABS
150.0000 mg | ORAL_TABLET | Freq: Once | ORAL | 0 refills | Status: AC
Start: 2019-11-21 — End: 2019-11-21

## 2019-11-21 NOTE — ED Triage Notes (Signed)
Patient here requesting full STD workup d/t vaginal irritation.

## 2019-11-21 NOTE — Discharge Instructions (Signed)
Begin diflucan- 1 tab today, may repeat in 3 days if still having symptoms  We are testing you for HIV, Syphillis, Gonorrhea, Chlamydia, Trichomonas, Yeast and Bacterial Vaginosis. We will call you if anything is positive and let you know if you require any further treatment. Please inform partners of any positive results.   Please return if symptoms not improving with treatment, development of fever, nausea, vomiting, abdominal pain.

## 2019-11-21 NOTE — Progress Notes (Signed)
   Covid-19 Vaccination Clinic  Name:  Shephanie Romas    MRN: [id number] DOB: 09-26-2000  11/21/2019  Ms. Giglia was observed post Covid-19 immunization for 15 minutes without incident. She was provided with Vaccine Information Sheet and instruction to access the V-Safe system.   Ms. Josten was instructed to call 911 with any severe reactions post vaccine: Marland Kitchen Difficulty breathing  . Swelling of face and throat  . A fast heartbeat  . A bad rash all over body  . Dizziness and weakness   Immunizations Administered    Name Date Dose VIS Date Route   Moderna COVID-19 Vaccine 11/21/2019  1:28 PM 0.5 mL 05/2019 Intramuscular   Manufacturer: Gala Murdoch   Lot: [id number]   NDC: 69450-388-82

## 2019-11-21 NOTE — ED Provider Notes (Signed)
[place]    CSN: [id number] Arrival date & time: 11/21/19  1530      History   Chief Complaint Chief Complaint  Patient presents with   SEXUALLY TRANSMITTED DISEASE    HPI Valerie Newman is a 19 y.o. female presenting today for evaluation of vaginal irritation and STD screening.  Patient reports that over the past few days she has had a lot of vaginal itching and irritation.  She denies any significant discharge.  She reports her symptoms did begin a few days after having sexual intercourse.  She denies any urinary symptoms of dysuria, increased frequency or urgency.  Denies hematuria.  Denies any abdominal pain or back pain.  Denies fevers nausea or vomiting. Denies history of BV, does endorse history of yeast. Denies specific rashes or lesions, but does note a change to her skin that she notices after washing.  Uses Nexplanon for birth control, last menstrual cycle around 5/28.   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 Authorizing Provider  fluconazole (DIFLUCAN) 150 MG tablet Take 1 tablet (150 mg total) by mouth once for 1 dose. 11/21/19 11/21/19  Braxley Balandran C, PA-C  hydrocortisone (ANUSOL-HC) 25 MG suppository Place 1 suppository (25 mg total) rectally 2 (two) times daily. 10/27/19   Kerin Perna, NP  ibuprofen (ADVIL) 800 MG tablet Take 1 tablet (800 mg total) by mouth every 8 (eight)  hours as needed. 09/06/19   Kerin Perna, NP  nystatin-triamcinolone (MYCOLOG II) cream Apply to affected area daily 09/18/19   Orvan July, NP    Family History Family History  Problem Relation Age of Onset   Heart disease Mother    Stroke Mother    Diabetes Maternal Grandmother     Social History Social History   Tobacco Use   Smoking status: Never Smoker   Smokeless tobacco: Never Used  Substance Use Topics   Alcohol use: No   Drug use: No     Allergies   Bee venom and Wasp venom   Review of Systems Review of Systems  Constitutional: Negative for fever.  Respiratory: Negative for shortness of breath.   Cardiovascular: Negative for chest pain.  Gastrointestinal: Negative for abdominal pain, diarrhea, nausea and vomiting.  Genitourinary: Negative for dysuria, flank pain, genital sores, hematuria, menstrual problem, vaginal bleeding, vaginal discharge and vaginal pain.       Vaginal irritation  Musculoskeletal: Negative for back pain.  Skin: Negative for rash.  Neurological: Negative for dizziness, light-headedness and headaches.     Physical Exam Triage Vital Signs  ED Triage Vitals  Enc Vitals Group     BP 11/21/19 1609 106/72     Pulse Rate 11/21/19 1609 81     Resp 11/21/19 1609 12     Temp 11/21/19 1609 98.5 F (36.9 C)     Temp Source 11/21/19 1609 Oral     SpO2 11/21/19 1609 100 %     Weight --      Height --      Head Circumference --      Peak Flow --      Pain Score 11/21/19 1608 0     Pain Loc --      Pain Edu? --      Excl. in GC? --    No data found.  Updated Vital Signs BP 106/72 (BP Location: Left Arm)    Pulse 81    Temp 98.5 F (36.9 C) (Oral)    Resp 12    LMP 11/10/2019 (Exact Date)    SpO2 100%   Visual Acuity Right Eye Distance:   Left Eye Distance:   Bilateral Distance:    Right Eye Near:   Left Eye Near:    Bilateral Near:     Physical Exam Vitals and nursing note reviewed.  Constitutional:       Appearance: She is well-developed.     Comments: No acute distress  HENT:     Head: Normocephalic and atraumatic.     Nose: Nose normal.  Eyes:     Conjunctiva/sclera: Conjunctivae normal.  Cardiovascular:     Rate and Rhythm: Normal rate.  Pulmonary:     Effort: Pulmonary effort is normal. No respiratory distress.  Abdominal:     General: There is no distension.     Comments: Soft, nondistended, nontender light and deep palpation throughout abdomen  Genitourinary:    Comments: Normal external female genitalia, left lower labia with slight hypopigmentation compared to other skin, no specific rashes or lesions noted, large amount of thick yellowish discharge present in vagina, cervix with white adherent material, cervix mildly erythematous  No adnexal tenderness or masses palpated, no cervical motion tenderness Musculoskeletal:        General: Normal range of motion.     Cervical back: Neck supple.  Skin:    General: Skin is warm and dry.  Neurological:     Mental Status: She is alert and oriented to person, place, and time.      UC Treatments / Results  Labs (all labs ordered are listed, but only abnormal results are displayed) Labs Reviewed  POCT URINALYSIS DIP (DEVICE) - Abnormal; Notable for the following components:      Result Value   Protein, ur 30 (*)    Leukocytes,Ua TRACE (*)    All other components within normal limits  HIV ANTIBODY (ROUTINE TESTING W REFLEX)  RPR  POC URINE PREG, ED  CERVICOVAGINAL ANCILLARY ONLY    EKG   Radiology No results found.  Procedures Procedures (including critical care time)  Medications Ordered in UC Medications - No data to display  Initial Impression / Assessment and Plan / UC Course  I have reviewed the triage vital signs and the nursing notes.  Pertinent labs & imaging results that were available during my care of the patient were reviewed by me and considered in my medical decision making (see chart for  details).     Empirically treating for yeast today.  Providing Diflucan.  STD screening pending.  Will call with results and alter  treatment as needed.  Discussed strict return precautions. Patient verbalized understanding and is agreeable with plan.  Final Clinical Impressions(s) / UC Diagnoses   Final diagnoses:  Vaginitis and vulvovaginitis  Screen for STD (sexually transmitted disease)     Discharge Instructions     Begin diflucan- 1 tab today, may repeat in 3 days if still having symptoms  We are testing you for HIV, Syphillis, Gonorrhea, Chlamydia, Trichomonas, Yeast and Bacterial Vaginosis. We will call you if anything is positive and let you know if you require any further treatment. Please inform partners of any positive results.   Please return if symptoms not improving with treatment, development of fever, nausea, vomiting, abdominal pain.    ED Prescriptions    Medication Sig Dispense Auth. Provider   fluconazole (DIFLUCAN) 150 MG tablet Take 1 tablet (150 mg total) by mouth once for 1 dose. 2 tablet Lani Havlik, [place] C, PA-C     PDMP not reviewed this encounter.   Lew Dawes, [place] 11/21/19 1825

## 2019-11-22 ENCOUNTER — Telehealth (HOSPITAL_COMMUNITY): Payer: Self-pay | Admitting: Orthopedic Surgery

## 2019-11-22 LAB — CERVICOVAGINAL ANCILLARY ONLY
Bacterial Vaginitis (gardnerella): POSITIVE — AB
Candida Glabrata: NEGATIVE
Candida Vaginitis: POSITIVE — AB
Chlamydia: NEGATIVE
Comment: NEGATIVE
Comment: NEGATIVE
Comment: NEGATIVE
Comment: NEGATIVE
Comment: NEGATIVE
Comment: NORMAL
Neisseria Gonorrhea: NEGATIVE
Trichomonas: NEGATIVE

## 2019-11-22 LAB — RPR: RPR Ser Ql: NONREACTIVE

## 2019-11-22 MED ORDER — METRONIDAZOLE 500 MG PO TABS
500.0000 mg | ORAL_TABLET | Freq: Two times a day (BID) | ORAL | 0 refills | Status: DC
Start: 2019-11-22 — End: 2019-12-28

## 2019-12-26 ENCOUNTER — Encounter (HOSPITAL_COMMUNITY): Payer: Self-pay

## 2019-12-26 ENCOUNTER — Other Ambulatory Visit: Payer: Self-pay

## 2019-12-26 ENCOUNTER — Emergency Department (HOSPITAL_COMMUNITY)
Admission: EM | Admit: 2019-12-26 | Discharge: 2019-12-26 | Discharge status: Against Medical Advice | Payer: Medicaid Other | Attending: Emergency Medicine | Admitting: Emergency Medicine

## 2019-12-26 DIAGNOSIS — N898 Other specified noninflammatory disorders of vagina: Secondary | ICD-10-CM

## 2019-12-26 DIAGNOSIS — L232 Allergic contact dermatitis due to cosmetics: Secondary | ICD-10-CM | POA: Diagnosis not present

## 2019-12-26 DIAGNOSIS — N7689 Other specified inflammation of vagina and vulva: Secondary | ICD-10-CM | POA: Diagnosis present

## 2019-12-26 DIAGNOSIS — J45909 Unspecified asthma, uncomplicated: Secondary | ICD-10-CM | POA: Insufficient documentation

## 2019-12-26 LAB — WET PREP, GENITAL
Clue Cells Wet Prep HPF POC: NONE SEEN
Sperm: NONE SEEN
Trich, Wet Prep: NONE SEEN
Yeast Wet Prep HPF POC: NONE SEEN

## 2019-12-26 LAB — I-STAT BETA HCG BLOOD, ED (MC, WL, AP ONLY): I-stat hCG, quantitative: <5 m[IU]/mL (ref <5)

## 2019-12-26 NOTE — ED Notes (Signed)
Sent a dark green, light green, lavender and 2 golds to lab.

## 2019-12-26 NOTE — ED Triage Notes (Signed)
Patient c/o vaginal swelling, rash, and yellow drainage x 3 days.

## 2019-12-26 NOTE — ED Provider Notes (Signed)
[facility]-EMERGENCY DEPT Provider Note   CSN: [id number] Arrival date & time: 12/26/19  1014     History Chief Complaint  Patient presents with  . Groin Swelling  . vaginal rash    Valerie Newman is a 19 y.o. female who presents to the ED today with complaints of swelling/irritation to her left labia x 3 days.  Reports she recently started using a new soap and thinks she might be having a reaction to it.  She is also complaining of thin yellow/greenish drainage for the past 3 days.  She states she went to urgent care 2 weeks ago with similar symptoms and was treated for both BV and yeast.  She states that the Flagyl that she has been taking does not taste good and leaves a foul taste in her mouth even the next day so she has only been taking it sporadically.  She states that she thought the Diflucan cleared up her symptoms however they returned 3 days ago prompting her to come back to the ED today for further evaluation.  Patient is sexually active with one female partner, they do not use protection.  She states she is not concerned about STIs because she was tested 2 weeks ago where she would like to be tested again today.  Patient does report that she has also been having some urinary frequency.  Last normal menstrual period 06/27.  Patient denies fevers, chills, nausea, vomiting, abdominal pain, diarrhea, constipation, dysuria, urgency, hematuria, pelvic pain, any other associated symptoms.   The history is provided by the patient and medical records.       Past Medical History:  Diagnosis Date  . 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 Allergy to pollen   . Anemia   . Anxiety   . Asthma     Patient Active Problem List   Diagnosis Date Noted  . Nexplanon insertion   . Chlamydia infection 10/15/2017    Past Surgical History:  Procedure Laterality Date  . TOOTH EXTRACTION       OB History    Gravida  1   Para  1   Term  1   Preterm  0   AB  0    Living  1     SAB  0   TAB  0   Ectopic  0   Multiple  0   Live Births  1           Family History  Problem Relation Age of Onset  . Heart disease Mother   . Stroke Mother   . Diabetes Maternal Grandmother     Social History   Tobacco Use  . Smoking status: Never Smoker  . Smokeless tobacco: Never Used  Vaping Use  . Vaping Use: Never used  Substance Use Topics  . Alcohol use: No  . Drug use: No    Home Medications Prior to Admission medications   Medication Sig Start Date End Date Taking? Authorizing Provider  hydrocortisone (ANUSOL-HC) 25 MG suppository Place 1 suppository (25 mg total) rectally 2 (two) times daily. 10/27/19   [name], NP  ibuprofen (ADVIL) 800 MG tablet Take 1 tablet (800 mg total) by mouth every 8 (eight) hours as needed. 09/06/19   [name], NP  metroNIDAZOLE (FLAGYL) 500 MG tablet Take 1 tablet (500 mg total) by mouth 2 (two) times daily. 11/22/19   Lamptey, Britta Mccreedy, MD  nystatin-triamcinolone (MYCOLOG II) cream Apply  to affected area daily 09/18/19   Janace Aris, NP    Allergies    Bee venom and Wasp venom  Review of Systems   Review of Systems  Constitutional: Negative for chills and fever.  Gastrointestinal: Negative for abdominal pain, constipation, diarrhea, nausea and vomiting.  Genitourinary: Positive for frequency and vaginal discharge. Negative for difficulty urinating, dysuria and flank pain.  Skin: Positive for rash.  All other systems reviewed and are negative.   Physical Exam Updated Vital Signs BP 118/75 (BP Location: Right Arm)   Pulse 79   Temp 99.3 F (37.4 C) (Oral)   Resp 16   Ht 5\' 4"  (1.626 m)   Wt 57.6 kg   LMP 12/05/2019   SpO2 100%   BMI 21.80 kg/m   Physical Exam Vitals and nursing note reviewed.  Constitutional:      Appearance: She is not ill-appearing or diaphoretic.  HENT:     Head: Normocephalic and atraumatic.  Eyes:     Conjunctiva/sclera: Conjunctivae normal.    Cardiovascular:     Rate and Rhythm: Normal rate and regular rhythm.     Pulses: Normal pulses.  Pulmonary:     Effort: Pulmonary effort is normal.     Breath sounds: Normal breath sounds. No wheezing, rhonchi or rales.  Abdominal:     Palpations: Abdomen is soft.     Tenderness: There is no abdominal tenderness. There is no right CVA tenderness, left CVA tenderness, guarding or rebound.  Genitourinary:    Comments: Chaperone present for exam. Contact dermatitis to inguinal fold on left side with moderate swelling to the labia. No bartholin's cyst appreciated. Thin yellow discharge to vault. No adnexal masses, tenderness, or fullness. No CMT, cervical friability, or discharge from cervical os. Cervical os is closed. Uterus non-deviated, mobile, nonTTP, and without enlargement.  Musculoskeletal:     Cervical back: Neck supple.  Skin:    General: Skin is warm and dry.  Neurological:     Mental Status: She is alert.     ED Results / Procedures / Treatments   Labs (all labs ordered are listed, but only abnormal results are displayed) Labs Reviewed  WET PREP, GENITAL - Abnormal; Notable for the following components:      Result Value   WBC, Wet Prep HPF POC MANY (*)    All other components within normal limits  RPR  URINALYSIS, ROUTINE W REFLEX MICROSCOPIC  HIV ANTIBODY (ROUTINE TESTING W REFLEX)  I-STAT BETA HCG BLOOD, ED ([facility], [facility], [facility] ONLY)  GC/CHLAMYDIA PROBE AMP ([place]) NOT AT [facility]    EKG None  Radiology No results found.  Procedures Procedures (including critical care time)  Medications Ordered in ED Medications - No data to display  ED Course  I have reviewed the triage vital signs and the nursing notes.  Pertinent labs & imaging results that were available during my care of the patient were reviewed by me and considered in my medical decision making (see chart for details).    MDM Rules/Calculators/A&P                          19 year old female who  presents to the ED today complaining of swelling to her left labia after starting a new soap 3 days ago.  Also having vaginal discharge, recently treated for BV and yeast however has not been compliant with her medication as it leaves a bad taste in her mouth.  On arrival  to the ED patient is afebrile, nontachycardic and nontachypneic.  She appears to be in no acute distress.  She has no abdominal tenderness on exam.  She is denying any pelvic pain.  Chaperone present for pelvic exam, patient does have contact dermatitis rash noted to her left inguinal fold.  She also has some moderate swelling to her left labia.  I suspect she is having an allergic reaction to the soap.  She is also noted to have yellow vaginal discharge.  Will test for STIs at this time as well as BV, yeast.  Patient would like testing for HIV and syphilis as well.  She does report she has been having some urinary frequency, will check a UA.   Nursing staff informed me that pt left. U/A had not been collected and therefor unable to fully evaluate for patient's urinary frequency. Wet prep without signs of yeast, trich, or clue cells however symptoms are likely related to either gonorrhea or chlamydia. I was unable to speak with patient regarding results of wet prep prior to her eloping. She did not receive any treatment in the ED as she ELOPED.   This note was prepared using Dragon voice recognition software and may include unintentional dictation errors due to the inherent limitations of voice recognition software.  Final Clinical Impression(s) / ED Diagnoses Final diagnoses:  Allergic contact dermatitis due to cosmetics  Vaginal discharge    Rx / DC Orders ED Discharge Orders    None       Tanda Rockers, PA-C 12/26/19 1500    Derwood Kaplan, MD 12/31/19 1324

## 2019-12-27 LAB — GC/CHLAMYDIA PROBE AMP (~~LOC~~) NOT AT ARMC
Chlamydia: NEGATIVE
Comment: NEGATIVE
Comment: NORMAL
Neisseria Gonorrhea: NEGATIVE

## 2019-12-28 ENCOUNTER — Other Ambulatory Visit: Payer: Self-pay

## 2019-12-28 ENCOUNTER — Encounter (INDEPENDENT_AMBULATORY_CARE_PROVIDER_SITE_OTHER): Payer: Self-pay | Admitting: Primary Care

## 2019-12-28 ENCOUNTER — Ambulatory Visit (INDEPENDENT_AMBULATORY_CARE_PROVIDER_SITE_OTHER): Payer: Medicaid Other | Admitting: Primary Care

## 2019-12-28 VITALS — BP 112/76 | HR 79 | Temp 99.0°F | Ht 64.0 in | Wt 127.4 lb

## 2019-12-28 DIAGNOSIS — L259 Unspecified contact dermatitis, unspecified cause: Secondary | ICD-10-CM | POA: Diagnosis not present

## 2019-12-28 DIAGNOSIS — D508 Other iron deficiency anemias: Secondary | ICD-10-CM

## 2019-12-28 DIAGNOSIS — Z1159 Encounter for screening for other viral diseases: Secondary | ICD-10-CM | POA: Diagnosis not present

## 2019-12-28 DIAGNOSIS — N760 Acute vaginitis: Secondary | ICD-10-CM

## 2019-12-28 DIAGNOSIS — B9689 Other specified bacterial agents as the cause of diseases classified elsewhere: Secondary | ICD-10-CM

## 2019-12-28 MED ORDER — CLINDAMYCIN HCL 300 MG PO CAPS: 300.0000 mg | ORAL_CAPSULE | Freq: Three times a day (TID) | ORAL | 0 refills | Status: DC

## 2019-12-28 MED ORDER — FLUCONAZOLE 150 MG PO TABS: 150.0000 mg | ORAL_TABLET | Freq: Once | ORAL | 0 refills | Status: AC

## 2019-12-28 NOTE — Patient Instructions (Addendum)
Iron rich foods such as shellfish,liver, organ meats(liver, gizzard), and red meats can increase cholesterol and should be consumed in moderation.However; legumes(beans), spinach, pumpkin seeds, [place], broccoli, tofu, green leafy vegetables and dark chocolate can be consumed without concern to cholesterol.   Contact Dermatitis Dermatitis is redness, soreness, and swelling (inflammation) of the skin. Contact dermatitis is a reaction to something that touches the skin. There are two types of contact dermatitis:  Irritant contact dermatitis. This happens when something bothers (irritates) your skin, like soap.  Allergic contact dermatitis. This is caused when you are exposed to something that you are allergic to, such as poison ivy. What are the causes?  Common causes of irritant contact dermatitis include: ? Makeup. ? Soaps. ? Detergents. ? Bleaches. ? Acids. ? Metals, such as nickel.  Common causes of allergic contact dermatitis include: ? Plants. ? Chemicals. ? Jewelry. ? Latex. ? Medicines. ? Preservatives in products, such as clothing. What increases the risk?  Having a job that exposes you to things that bother your skin.  Having asthma or eczema. What are the signs or symptoms? Symptoms may happen anywhere the irritant has touched your skin. Symptoms include:  Dry or flaky skin.  Redness.  Cracks.  Itching.  Pain or a burning feeling.  Blisters.  Blood or clear fluid draining from skin cracks. With allergic contact dermatitis, swelling may occur. This may happen in places such as the eyelids, mouth, or genitals. How is this treated?  This condition is treated by checking for the cause of the reaction and protecting your skin. Treatment may also include: ? Steroid creams, ointments, or medicines. ? Antibiotic medicines or other ointments, if you have a skin infection. ? Lotion or medicines to help with itching. ? A bandage (dressing). Follow these  instructions at home: Skin care  Moisturize your skin as needed.  Put cool cloths on your skin.  Put a baking soda paste on your skin. Stir water into baking soda until it looks like a paste.  Do not scratch your skin.  Avoid having things rub up against your skin.  Avoid the use of soaps, perfumes, and dyes. Medicines  Take or apply over-the-counter and prescription medicines only as told by your doctor.  If you were prescribed an antibiotic medicine, take or apply it as told by your doctor. Do not stop using it even if your condition starts to get better. Bathing  Take a bath with: ? Epsom salts. ? Baking soda. ? Colloidal oatmeal.  Bathe less often.  Bathe in warm water. Avoid using hot water. Bandage care  If you were given a bandage, change it as told by your health care provider.  Wash your hands with soap and water before and after you change your bandage. If soap and water are not available, use hand sanitizer. General instructions  Avoid the things that caused your reaction. If you do not know what caused it, keep a journal. Write down: ? What you eat. ? What skin products you use. ? What you drink. ? What you wear in the area that has symptoms. This includes jewelry.  Check the affected areas every day for signs of infection. Check for: ? More redness, swelling, or pain. ? More fluid or blood. ? Warmth. ? Pus or a bad smell.  Keep all follow-up visits as told by your doctor. This is important. Contact a doctor if:  You do not get better with treatment.  Your condition gets worse.  You have signs  of infection, such as: ? More swelling. ? Tenderness. ? More redness. ? Soreness. ? Warmth.  You have a fever.  You have new symptoms. Get help right away if:  You have a very bad headache.  You have neck pain.  Your neck is stiff.  You throw up (vomit).  You feel very sleepy.  You see red streaks coming from the area.  Your bone or  joint near the area hurts after the skin has healed.  The area turns darker.  You have trouble breathing. Summary  Dermatitis is redness, soreness, and swelling of the skin.  Symptoms may occur where the irritant has touched you.  Treatment may include medicines and skin care.  If you do not know what caused your reaction, keep a journal.  Contact a doctor if your condition gets worse or you have signs of infection. This information is not intended to replace advice given to you by your health care provider. Make sure you discuss any questions you have with your health care provider. Document Revised: 09/21/2018 Document Reviewed: 12/15/2017 Elsevier Patient Education  2020 [employer].

## 2019-12-28 NOTE — Progress Notes (Signed)
Pt states metronidazole makes her feel sick

## 2019-12-28 NOTE — Progress Notes (Signed)
New Patient Office Visit  Subjective:  Patient ID: Valerie Newman, female    DOB: 06/11/2001  Age: 19 y.o. MRN: [id number]  CC:  Chief Complaint  Patient presents with  . breakout    inner thighs from using new soap     HPI Ms. Alvira Hecht is a 19 year female that is absent at this time. Presently being treated for BV  Unable to tolerate flagyl made her terribly sick she stopped the medication.  Past Medical History:  Diagnosis Date  . 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 Allergy to pollen   . Anemia   . Anxiety   . Asthma     Past Surgical History:  Procedure Laterality Date  . TOOTH EXTRACTION      Family History  Problem Relation Age of Onset  . Heart disease Mother   . Stroke Mother   . Diabetes Maternal Grandmother     Social History   Socioeconomic History  . Marital status: Single    Spouse name: Not on file  . Number of children: Not on file  . Years of education: Not on file  . Highest education level: Not on file  Occupational History  . Not on file  Tobacco Use  . Smoking status: Never Smoker  . Smokeless tobacco: Never Used  Vaping Use  . Vaping Use: Never used  Substance and Sexual Activity  . Alcohol use: No  . Drug use: No  . Sexual activity: Not Currently    Birth control/protection: Condom    Comment: placed 2020  Other Topics Concern  . Not on file  Social History Narrative  . Not on file   Social Determinants of Health   Financial Resource Strain:   . Difficulty of Paying Living Expenses:   Food Insecurity:   . Worried About Programme researcher, broadcasting/film/video in the Last Year:   . Barista in the Last Year:   Transportation Needs:   . Freight forwarder (Medical):   Marland Kitchen Lack of Transportation (Non-Medical):   Physical Activity:   . Days of Exercise per Week:   . Minutes of Exercise per Session:   Stress:   . Feeling of Stress :   Social Connections:   . Frequency of Communication with Friends and Family:   . Frequency  of Social Gatherings with Friends and Family:   . Attends Religious Services:   . Active Member of Clubs or Organizations:   . Attends Banker Meetings:   Marland Kitchen Marital Status:   Intimate Partner Violence:   . Fear of Current or Ex-Partner:   . Emotionally Abused:   Marland Kitchen Physically Abused:   . Sexually Abused:     ROS Review of Systems  Skin: Positive for rash.  All other systems reviewed and are negative.   Objective:   Today's Vitals: BP 112/76 (BP Location: Right Arm, Patient Position: Sitting, Cuff Size: Normal)   Pulse 79   Temp 99 F (37.2 C) (Oral)   Ht 5\' 4"  (1.626 m)   Wt 127 lb 6.4 oz (57.8 kg)   LMP 12/05/2019   SpO2 100%   BMI 21.87 kg/m   Physical Exam Vitals reviewed.  Constitutional:      Appearance: Normal appearance. She is normal weight.  Cardiovascular:     Rate and Rhythm: Normal rate and regular rhythm.     Pulses: Normal pulses.     Heart sounds: Normal heart sounds.  Pulmonary:     Effort: Pulmonary effort is normal.     Breath sounds: Normal breath sounds.  Abdominal:     General: Bowel sounds are normal.  Musculoskeletal:        General: Normal range of motion.     Cervical back: Normal range of motion and neck supple.  Neurological:     Mental Status: She is oriented to person, place, and time.  Psychiatric:        Mood and Affect: Mood normal.        Behavior: Behavior normal.        Thought Content: Thought content normal.        Judgment: Judgment normal.     Assessment & Plan:  Shannin was seen today for breakout.  Diagnoses and all orders for this visit:  BV (bacterial vaginosis) -     clindamycin (CLEOCIN) 300 MG capsule; Take 1 capsule (300 mg total) by mouth 3 (three) times daily. -     fluconazole (DIFLUCAN) 150 MG tablet; Take 1 tablet (150 mg total) by mouth once for 1 dose.  Other iron deficiency anemia Foods high in iron placed on AVS. She prefers not to take iron pills upsets her stomach. -     CBC  with Differential  Contact dermatitis and eczema Contact dermatitis is hypersensitivity reaction to a substance causing .Conservative treatment luke warm baths, Aveeno oatmeal bath or emollients. Medications can be discussed at this visit- antihistamine or pruritic medications. Causes may be contributed to changes in soaps, deodorants, laundry detergent or environmental- grass, weeds and trees .Admits to changing her body wash.   Need for hepatitis C screening test -     Hepatitis C Antibody    Outpatient Encounter Medications as of 12/28/2019  Medication Sig  . clindamycin (CLEOCIN) 300 MG capsule Take 1 capsule (300 mg total) by mouth 3 (three) times daily.  . fluconazole (DIFLUCAN) 150 MG tablet Take 1 tablet (150 mg total) by mouth once for 1 dose.  . nystatin-triamcinolone (MYCOLOG II) cream Apply to affected area daily (Patient not taking: Reported on 12/28/2019)  . [DISCONTINUED] hydrocortisone (ANUSOL-HC) 25 MG suppository Place 1 suppository (25 mg total) rectally 2 (two) times daily.  . [DISCONTINUED] ibuprofen (ADVIL) 800 MG tablet Take 1 tablet (800 mg total) by mouth every 8 (eight) hours as needed.  . [DISCONTINUED] metroNIDAZOLE (FLAGYL) 500 MG tablet Take 1 tablet (500 mg total) by mouth 2 (two) times daily.   No facility-administered encounter medications on file as of 12/28/2019.    Follow-up: Return in about 6 months (around 06/29/2020) for  in person physical .   [name], NP

## 2019-12-29 LAB — CBC WITH DIFFERENTIAL/PLATELET
Basophils Absolute: 0 10*3/uL (ref 0.0–0.2)
Basos: 1 %
EOS (ABSOLUTE): 0.2 10*3/uL (ref 0.0–0.4)
Eos: 4 %
Hematocrit: 35.1 % (ref 34.0–46.6)
Hemoglobin: 11 g/dL — ABNORMAL LOW (ref 11.1–15.9)
Immature Grans (Abs): 0 10*3/uL (ref 0.0–0.1)
Immature Granulocytes: 0 %
Lymphocytes Absolute: 1.7 10*3/uL (ref 0.7–3.1)
Lymphs: 27 %
MCH: 24.6 pg — ABNORMAL LOW (ref 26.6–33.0)
MCHC: 31.3 g/dL — ABNORMAL LOW (ref 31.5–35.7)
MCV: 78 fL — ABNORMAL LOW (ref 79–97)
Monocytes Absolute: 0.5 10*3/uL (ref 0.1–0.9)
Monocytes: 8 %
Neutrophils Absolute: 3.7 10*3/uL (ref 1.4–7.0)
Neutrophils: 60 %
Platelets: 279 10*3/uL (ref 150–450)
RBC: 4.48 x10E6/uL (ref 3.77–5.28)
RDW: 15 % (ref 11.7–15.4)
WBC: 6.2 10*3/uL (ref 3.4–10.8)

## 2019-12-29 LAB — HEPATITIS C ANTIBODY: Hep C Virus Ab: <0.1 s/co ratio (ref 0.0–0.9)

## 2020-02-26 ENCOUNTER — Ambulatory Visit (HOSPITAL_COMMUNITY)
Admission: EM | Admit: 2020-02-26 | Discharge: 2020-02-26 | Disposition: A | Discharge status: Home / Self Care | Payer: Medicaid Other

## 2020-02-26 ENCOUNTER — Other Ambulatory Visit: Payer: Self-pay

## 2020-02-26 ENCOUNTER — Other Ambulatory Visit (HOSPITAL_COMMUNITY)
Admission: RE | Admit: 2020-02-26 | Discharge: 2020-02-26 | Disposition: A | Discharge status: Home / Self Care | Payer: Medicaid Other | Source: Ambulatory Visit | Attending: Obstetrics and Gynecology | Admitting: Obstetrics and Gynecology

## 2020-02-26 ENCOUNTER — Ambulatory Visit (INDEPENDENT_AMBULATORY_CARE_PROVIDER_SITE_OTHER): Payer: Medicaid Other | Admitting: *Deleted

## 2020-02-26 VITALS — BP 102/67 | HR 76 | Temp 98.0°F | Ht 64.0 in | Wt 130.8 lb

## 2020-02-26 DIAGNOSIS — N898 Other specified noninflammatory disorders of vagina: Secondary | ICD-10-CM

## 2020-02-26 NOTE — Progress Notes (Signed)
   SUBJECTIVE:  19 y.o. female complains of white and itchy vaginal discharge for 2 day(s). Denies abnormal vaginal bleeding or significant pelvic pain or fever. No UTI symptoms. Denies history of known exposure to STD.  No LMP recorded.  OBJECTIVE:  She appears well, afebrile. Urine dipstick: not done.  ASSESSMENT:  Vaginal Discharge  Vaginal Itching   PLAN:  GC, chlamydia, trichomonas, BVAG, CVAG probe sent to lab. Treatment: To be determined once lab results are received ROV prn if symptoms persist or worsen.  Clovis Pu, RN

## 2020-02-27 ENCOUNTER — Telehealth: Payer: Self-pay | Admitting: *Deleted

## 2020-02-27 DIAGNOSIS — N760 Acute vaginitis: Secondary | ICD-10-CM

## 2020-02-27 DIAGNOSIS — B3731 Acute candidiasis of vulva and vagina: Secondary | ICD-10-CM

## 2020-02-27 DIAGNOSIS — B9689 Other specified bacterial agents as the cause of diseases classified elsewhere: Secondary | ICD-10-CM

## 2020-02-27 LAB — CERVICOVAGINAL ANCILLARY ONLY
Bacterial Vaginitis (gardnerella): POSITIVE — AB
Candida Glabrata: POSITIVE — AB
Candida Vaginitis: POSITIVE — AB
Chlamydia: NEGATIVE
Comment: NEGATIVE
Comment: NEGATIVE
Comment: NEGATIVE
Comment: NEGATIVE
Comment: NEGATIVE
Comment: NORMAL
Neisseria Gonorrhea: NEGATIVE
Trichomonas: NEGATIVE

## 2020-02-27 MED ORDER — METRONIDAZOLE 500 MG PO TABS: 500.0000 mg | ORAL_TABLET | Freq: Two times a day (BID) | ORAL | 0 refills | Status: DC

## 2020-02-27 MED ORDER — FLUCONAZOLE 150 MG PO TABS: 150.0000 mg | ORAL_TABLET | Freq: Once | ORAL | 0 refills | Status: AC

## 2020-02-27 NOTE — Telephone Encounter (Signed)
-----   Message from Raelyn Mora, [place] sent at 02/27/2020 12:44 PM EDT ----- Please treat for BV then yeast

## 2020-02-27 NOTE — Telephone Encounter (Signed)
Spoke with patient regarding test results.  Clovis Pu, RN

## 2020-03-05 ENCOUNTER — Ambulatory Visit (INDEPENDENT_AMBULATORY_CARE_PROVIDER_SITE_OTHER): Payer: Self-pay | Admitting: *Deleted

## 2020-03-05 NOTE — Telephone Encounter (Signed)
C/o constipation x 10 days. C/o back pain, abdominal tenderness, poor appetite. Patient has taken mira lax, stool softeners,and stopped taking prescribed iron. Poor intake of water and does not exercise regularly. appt made for 03/13/20. Suggested warm liquids to drink ex: prune juice or coffee. Care advise given. Patient verbalized understanding of care advise and to call back or go to [facility] or ED if symptoms worsen.    Reason for Disposition  Last bowel movement (BM) > 4 days ago  Answer Assessment - Initial Assessment Questions 1. STOOL PATTERN OR FREQUENCY: "How often do you pass bowel movements (BMs)?"  (Normal range: tid to q 3 days)  "When was the last BM passed?"       Every 4 days . LBM 10 days ago  2. STRAINING: "Do you have to strain to have a BM?"      Yes  3. RECTAL PAIN: "Does your rectum hurt when the stool comes out?" If Yes, ask: "Do you have hemorrhoids? How bad is the pain?"  (Scale 1-10; or mild, moderate, severe)     Yes . Has had hemorrhoids in the past denies now.  4. STOOL COMPOSITION: "Are the stools hard?"      yes 5. BLOOD ON STOOLS: "Has there been any blood on the toilet tissue or on the surface of the BM?" If Yes, ask: "When was the last time?"      sometimes 6. CHRONIC CONSTIPATION: "Is this a new problem for you?"  If no, ask: "How long have you had this problem?" (days, weeks, months)      Since childbirth  7. CHANGES IN DIET OR HYDRATION: "Have there been any recent changes in your diet?" "How much fluids are you drinking consuming on a daily basis?"  "How much have you had to drink today?"     Yes stopped taking iron, drinks miralax, not staying hydrated  8. MEDICATIONS: "Have you been taking any new medications?" "Are you taking any narcotic pain medications?" (e.g., Vicoden, Percocet, morphine, dilaudid)     Iron. No pain meds  9. LAXATIVES: "Have you been using any stool softeners, laxatives, or enemas?"  If yes, ask "What, how often, and when was the last  time?" Stool softeners , mira lax  10.ACTIVITY:  "How much walking do you do every day? on a daily basis?"  "Has your activity level decreased in the past week?"        Not a lot  11. CAUSE: "What do you think is causing the constipation?"        Does not know cause but has chronic problem 12. OTHER SYMPTOMS: "Do you have any other symptoms?" (e.g., abdominal pain, bloating, fever, vomiting)       Poor appetite and back pain 13. MEDICAL HISTORY: "Do you have a history of hemorrhoids, rectal fissures, or rectal surgery or rectal abscess?"         Hx hemorrhoids 14. PREGNANCY: "Is there any chance you are pregnant?" "When was your last menstrual period?"       na  Protocols used: CONSTIPATION-A-AH

## 2020-03-06 ENCOUNTER — Telehealth (INDEPENDENT_AMBULATORY_CARE_PROVIDER_SITE_OTHER): Payer: Self-pay | Admitting: Primary Care

## 2020-03-06 NOTE — Telephone Encounter (Signed)
Pt is calling and per pt [facility] sent Valerie Newman some paperwork. Pt is having oral dental surgery tomorrow and needs her iron level check today. Please advise

## 2020-03-07 NOTE — Telephone Encounter (Signed)
Unable to take orders from another provider

## 2020-03-07 NOTE — Telephone Encounter (Signed)
Per patient she went to a [facility] facility and had blood drawn on yesterday.

## 2020-03-13 ENCOUNTER — Encounter (INDEPENDENT_AMBULATORY_CARE_PROVIDER_SITE_OTHER): Payer: Self-pay | Admitting: Primary Care

## 2020-03-13 ENCOUNTER — Other Ambulatory Visit: Payer: Self-pay

## 2020-03-13 ENCOUNTER — Ambulatory Visit (INDEPENDENT_AMBULATORY_CARE_PROVIDER_SITE_OTHER): Payer: Medicaid Other | Admitting: Primary Care

## 2020-03-13 VITALS — BP 120/80 | HR 74 | Temp 97.5°F | Ht 64.0 in | Wt 130.0 lb

## 2020-03-13 DIAGNOSIS — K5900 Constipation, unspecified: Secondary | ICD-10-CM | POA: Diagnosis not present

## 2020-03-13 DIAGNOSIS — R1031 Right lower quadrant pain: Secondary | ICD-10-CM

## 2020-03-13 DIAGNOSIS — K219 Gastro-esophageal reflux disease without esophagitis: Secondary | ICD-10-CM

## 2020-03-13 DIAGNOSIS — N83201 Unspecified ovarian cyst, right side: Secondary | ICD-10-CM

## 2020-03-13 DIAGNOSIS — R3915 Urgency of urination: Secondary | ICD-10-CM

## 2020-03-13 DIAGNOSIS — G8929 Other chronic pain: Secondary | ICD-10-CM

## 2020-03-13 DIAGNOSIS — R634 Abnormal weight loss: Secondary | ICD-10-CM

## 2020-03-13 MED ORDER — OMEPRAZOLE 20 MG PO CPDR: 20.0000 mg | DELAYED_RELEASE_CAPSULE | Freq: Every day | ORAL | 1 refills | Status: DC

## 2020-03-13 NOTE — Progress Notes (Signed)
Acute Office Visit  Subjective:    Patient ID: Valerie Newman, female    DOB: 2000-12-04, 19 y.o.   MRN: [id number]  Chief Complaint  Patient presents with   Constipation    HPI [name] is a 19 year old female that presents today for constipation.  She has had to use a enema for defecation.  She has little to no appetite may go without eating 1 to 2 days.  She also informed me of a history of a cyst on her ovary and she has right lower quadrant pain.  Also notes certain foods called burning and irritation. 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   Allergy to pollen    Anemia    Anxiety    Asthma     Past Surgical History:  Procedure Laterality Date   TOOTH EXTRACTION      Family History  Problem Relation Age of Onset   Heart disease Mother    Stroke Mother    Diabetes Maternal Grandmother     Social History   Socioeconomic History   Marital status: Single    Spouse name: Not on file   Number of children: Not on file   Years of education: Not on file   Highest education level: Not on file  Occupational History   Not on file  Tobacco Use   Smoking status: Never Smoker   Smokeless tobacco: Never Used  Vaping Use   Vaping Use: Never used  Substance and Sexual Activity   Alcohol use: No   Drug use: No   Sexual activity: Not Currently    Birth control/protection: Condom    Comment: placed 2020  Other Topics Concern   Not on file  Social History Narrative   Not on file   Social Determinants of Health   Financial Resource Strain:    Difficulty of Paying Living Expenses: Not on file  Food Insecurity:    Worried About Running Out of Food in the Last Year: Not on file   [employer] of Food in the Last Year: Not on file  Transportation Needs:    Lack of Transportation (Medical): Not on file   Lack of Transportation (Non-Medical): Not on file  Physical Activity:    Days of Exercise per  Week: Not on file   Minutes of Exercise per Session: Not on file  Stress:    Feeling of Stress : Not on file  Social Connections:    Frequency of Communication with Friends and Family: Not on file   Frequency of Social Gatherings with Friends and Family: Not on file   Attends Religious Services: Not on file   Active Member of Clubs or Organizations: Not on file   Attends Banker Meetings: Not on file   Marital Status: Not on file  Intimate Partner Violence:    Fear of Current or Ex-Partner: Not on file   Emotionally Abused: Not on file   Physically Abused: Not on file   Sexually Abused: Not on file    Outpatient Medications Prior to Visit  Medication Sig Dispense Refill   clindamycin (CLEOCIN) 300 MG capsule Take 1 capsule (300 mg total) by mouth 3 (three) times daily. (Patient not taking: Reported on 02/26/2020) 7 capsule 0   metroNIDAZOLE (FLAGYL) 500 MG tablet Take 1 tablet (500 mg total) by mouth 2 (two) times daily. Take with food. 14 tablet 0   nystatin-triamcinolone (MYCOLOG II) cream  Apply to affected area daily (Patient not taking: Reported on 12/28/2019) 15 g 0   No facility-administered medications prior to visit.    Allergies  Allergen Reactions   Bee Venom Swelling   Wasp Venom Swelling    Review of Systems  Gastrointestinal: Positive for abdominal pain and constipation.  Genitourinary:       Inability to hold urine  All other systems reviewed and are negative.      Objective:    Physical Exam Vitals reviewed.  Constitutional:      Appearance: Normal appearance.  HENT:     Head: Normocephalic.     Right Ear: Tympanic membrane normal.     Left Ear: Tympanic membrane normal.     Nose: Nose normal.  Eyes:     Extraocular Movements: Extraocular movements intact.     Pupils: Pupils are equal, round, and reactive to light.  Cardiovascular:     Rate and Rhythm: Normal rate and regular rhythm.     Pulses: Normal pulses.      Heart sounds: Normal heart sounds.  Pulmonary:     Effort: Pulmonary effort is normal.     Breath sounds: Normal breath sounds.  Abdominal:     General: Bowel sounds are normal.     Palpations: Abdomen is soft.     Comments: Above belly button piecing remove keloid form   Neurological:     Mental Status: She is alert.     BP 120/80 (BP Location: Right Arm, Patient Position: Sitting, Cuff Size: Normal)    Pulse 74    Temp (!) 97.5 F (36.4 C) (Temporal)    Ht 5\' 4"  (1.626 m)    Wt 130 lb (59 kg)    LMP 02/28/2020 (Exact Date)    SpO2 100%    BMI 22.31 kg/m  Wt Readings from Last 3 Encounters:  03/13/20 130 lb (59 kg)  02/26/20 130 lb 12.8 oz (59.3 kg)  12/28/19 127 lb 6.4 oz (57.8 kg)    There are no preventive care reminders to display for this patient.  There are no preventive care reminders to display for this patient.   No results found for: TSH Lab Results  Component Value Date   WBC 6.2 12/28/2019   HGB 11.0 (L) 12/28/2019   HCT 35.1 12/28/2019   MCV 78 (L) 12/28/2019   PLT 279 12/28/2019   Lab Results  Component Value Date   NA 137 08/14/2019   K 3.8 08/14/2019   CO2 22 08/14/2019   GLUCOSE 82 08/14/2019   BUN 7 08/14/2019   CREATININE 0.64 08/14/2019   BILITOT 0.7 08/14/2019   ALKPHOS 52 08/14/2019   AST 20 08/14/2019   ALT 10 08/14/2019   PROT 6.6 08/14/2019   ALBUMIN 3.7 08/14/2019   CALCIUM 8.9 08/14/2019   ANIONGAP 9 08/14/2019   No results found for: CHOL No results found for: HDL No results found for: LDLCALC No results found for: TRIG No results found for: CHOLHDL No results found for: 10/14/2019     Assessment & Plan:  Aisia was seen today for constipation.  Diagnoses and all orders for this visit:  Constipation, unspecified constipation type She can go days without eating noting in nothing out she is under weight with no appetite discussed healthy eating and snacking . Increase water intake and information provided on AVS for  treatments  Abdominal pain, chronic, right lower quadrant She did state she had a cyst on her ovary imaging reviewed There is a  cyst measuring 2.4 x 1.1 x 2.0 cm which has somewhat lobulated border suggestive of involution. No adnexal mass. Tenderness on this side if increase pain return to gyn   Gastroesophageal reflux disease without esophagitis Discussed eating small frequent meal, reduction in acidic foods, fried foods ,spicy foods, alcohol caffeine and tobacco and certain medications. Avoid laying down after eating 24mins-1hour, elevated head of the bed. -     omeprazole (PRILOSEC) 20 MG capsule; Take 1 capsule (20 mg total) by mouth daily.  Urinary urgency Discussed pelvic floor exercises and on AVS   Loss of weight Discussed Body mass index is 22.31 kg/m.  She denies anorexia or bulimia just no appetite or desire to eat.  Encouraged to purchase boost or Ensure or protein shakes between meals this is not a replacement but will give calories and protein.  Cyst of right ovary She did state she had a cyst on her ovary imaging reviewed There is a cyst measuring 2.4 x 1.1 x 2.0 cm which has somewhat lobulated border suggestive of involution. No adnexal mass. Tenderness on this side if increase pain return to gyn    Meds ordered this encounter  Medications   omeprazole (PRILOSEC) 20 MG capsule    Sig: Take 1 capsule (20 mg total) by mouth daily.    Dispense:  90 capsule    Refill:  1     [name], NP

## 2020-03-13 NOTE — Patient Instructions (Addendum)
MANAGEMENT OF CHRONIC CONSTIPATION   Drink fluids in the recommended amount everyday. Recommend amount is 8 cups of water daily. Do not replace water with Gatorade or Powerade as these should only be used when you are dehydrated.   Eat lots of high fiber foods-fruits, veggies, bran and whole grain instead of white bread  Be active everyday. Inactivity makes constipation worse.  Add psyllium daily (Metamucil) which comes in capsules now. Start very low dose and work up to recommended dose on bottle daily.  Stay away from Milk of Magnesia or any magnesium containing laxative, unless you need it to clear things out rarely. It is an addictive laxative and your gut will become dependent on it.  If that is not working, I would start Miralax, which you can buy in generic 17 gms daily. It's a powder and not an "addictive laxative". Take it every day and titrate the dose up or down to get the daily Bm.  We will consider the use of other pharmacological treatments should the above recommendations prove to be unsuccessful.   Kegel Exercises  Kegel exercises can help strengthen your pelvic floor muscles. The pelvic floor is a group of muscles that support your rectum, small intestine, and bladder. In females, pelvic floor muscles also help support the womb (uterus). These muscles help you control the flow of urine and stool. Kegel exercises are painless and simple, and they do not require any equipment. Your provider may suggest Kegel exercises to:  Improve bladder and bowel control.  Improve sexual response.  Improve weak pelvic floor muscles after surgery to remove the uterus (hysterectomy) or pregnancy (females).  Improve weak pelvic floor muscles after prostate gland removal or surgery (males). Kegel exercises involve squeezing your pelvic floor muscles, which are the same muscles you squeeze when you try to stop the flow of urine or keep from passing gas. The exercises can be done while  sitting, standing, or lying down, but it is best to vary your position. Exercises How to do Kegel exercises: 1. Squeeze your pelvic floor muscles tight. You should feel a tight lift in your rectal area. If you are a female, you should also feel a tightness in your vaginal area. Keep your stomach, buttocks, and legs relaxed. 2. Hold the muscles tight for up to 10 seconds. 3. Breathe normally. 4. Relax your muscles. 5. Repeat as told by your health care provider. Repeat this exercise daily as told by your health care provider. Continue to do this exercise for at least 4-6 weeks, or for as long as told by your health care provider. You may be referred to a physical therapist who can help you learn more about how to do Kegel exercises. Depending on your condition, your health care provider may recommend:  Varying how long you squeeze your muscles.  Doing several sets of exercises every day.  Doing exercises for several weeks.  Making Kegel exercises a part of your regular exercise routine. This information is not intended to replace advice given to you by your health care provider. Make sure you discuss any questions you have with your health care provider. Document Revised: 01/19/2018 Document Reviewed: 01/19/2018 Elsevier Patient Education  2020 [employer].

## 2020-04-09 ENCOUNTER — Telehealth (INDEPENDENT_AMBULATORY_CARE_PROVIDER_SITE_OTHER): Payer: Medicaid Other | Admitting: Primary Care

## 2020-04-16 ENCOUNTER — Encounter (INDEPENDENT_AMBULATORY_CARE_PROVIDER_SITE_OTHER): Payer: Medicaid Other | Admitting: Primary Care

## 2020-04-21 NOTE — Progress Notes (Signed)
error 

## 2020-05-16 ENCOUNTER — Ambulatory Visit (HOSPITAL_COMMUNITY)
Admission: EM | Admit: 2020-05-16 | Discharge: 2020-05-16 | Disposition: A | Discharge status: Home / Self Care | Payer: Medicaid Other | Attending: Family Medicine | Admitting: Family Medicine

## 2020-05-16 ENCOUNTER — Encounter (HOSPITAL_COMMUNITY): Payer: Self-pay

## 2020-05-16 ENCOUNTER — Other Ambulatory Visit: Payer: Self-pay

## 2020-05-16 DIAGNOSIS — D508 Other iron deficiency anemias: Secondary | ICD-10-CM | POA: Diagnosis not present

## 2020-05-16 LAB — CBC
HCT: 31.8 % — ABNORMAL LOW (ref 36.0–46.0)
Hemoglobin: 9.7 g/dL — ABNORMAL LOW (ref 12.0–15.0)
MCH: 23.9 pg — ABNORMAL LOW (ref 26.0–34.0)
MCHC: 30.5 g/dL (ref 30.0–36.0)
MCV: 78.3 fL — ABNORMAL LOW (ref 80.0–100.0)
Platelets: 240 10*3/uL (ref 150–400)
RBC: 4.06 MIL/uL (ref 3.87–5.11)
RDW: 15 % (ref 11.5–15.5)
WBC: 4.5 10*3/uL (ref 4.0–10.5)
nRBC: 0 % (ref 0.0–0.2)

## 2020-05-16 LAB — IRON AND TIBC
Iron: 52 ug/dL (ref 28–170)
Saturation Ratios: 10 % — ABNORMAL LOW (ref 10.4–31.8)
TIBC: 542 ug/dL — ABNORMAL HIGH (ref 250–450)
UIBC: 490 ug/dL

## 2020-05-16 NOTE — ED Triage Notes (Signed)
Pt presents to have blood work done to check her iron levels. States that she is supposed to have some dental work done in the morning and they asked her to get that done prior.

## 2020-05-16 NOTE — ED Provider Notes (Signed)
[facility]    CSN: [id number] Arrival date & time: 05/16/20  1546      History   Chief Complaint Chief Complaint  Patient presents with  . Anemia    HPI Valerie Newman is a 19 y.o. female.   HPI  Patient presents for labs to check hemoglobin and iron panel.  She is having dental surgery tomorrow at 9 AM and needs to show that her anemia is controlled.  She denies any other symptoms.  Past Medical History:  Diagnosis Date  . 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 Allergy to pollen   . Anemia   . Anxiety   . Asthma     Patient Active Problem List   Diagnosis Date Noted  . Nexplanon insertion   . Chlamydia infection 10/15/2017    Past Surgical History:  Procedure Laterality Date  . TOOTH EXTRACTION      OB History    Gravida  1   Para  1   Term  1   Preterm  0   AB  0   Living  1     SAB  0   TAB  0   Ectopic  0   Multiple  0   Live Births  1            Home Medications    Prior to Admission medications   Medication Sig Start Date End Date Taking? Authorizing Provider  omeprazole (PRILOSEC) 20 MG capsule Take 1 capsule (20 mg total) by mouth daily. 03/13/20   [name], NP    Family History Family History  Problem Relation Age of Onset  . Heart disease Mother   . Stroke Mother   . Diabetes Maternal Grandmother     Social History Social History   Tobacco Use  . Smoking status: Never Smoker  . Smokeless tobacco: Never Used  Vaping Use  . Vaping Use: Never used  Substance Use Topics  . Alcohol use: No  . Drug use: No     Allergies   Bee venom and Wasp venom   Review of Systems Review of Systems Pertinent negatives listed in HPI  Physical Exam Triage Vital Signs ED Triage Vitals  Enc Vitals Group     BP 05/16/20 1645 121/77     Pulse Rate 05/16/20 1645 77     Resp 05/16/20 1645 19     Temp 05/16/20 1645 98.7 F (37.1 C)     Temp src --      SpO2 05/16/20 1645 99 %     Weight --       Height --      Head Circumference --      Peak Flow --      Pain Score 05/16/20 1643 3     Pain Loc --      Pain Edu? --      Excl. in GC? --    No data found.  Updated Vital Signs BP 121/77   Pulse 77   Temp 98.7 F (37.1 C)   Resp 19   LMP 05/04/2020   SpO2 99%   Visual Acuity Right Eye Distance:   Left Eye Distance:   Bilateral Distance:    Right Eye Near:   Left Eye Near:    Bilateral Near:     Physical Exam General appearance: alert, well developed, well nourished, cooperative Head: Normocephalic, without obvious abnormality, atraumatic Respiratory: Respirations even and unlabored, normal respiratory  rate Heart: rate and rhythm normal.  Extremities: No gross deformities Skin: Skin color, texture, turgor normal. No rashes seen  Psych: Appropriate mood and affect.  UC Treatments / Results  Labs (all labs ordered are listed, but only abnormal results are displayed) Labs Reviewed  IRON AND TIBC  CBC    EKG   Radiology No results found.  Procedures Procedures (including critical care time)  Medications Ordered in UC Medications - No data to display  Initial Impression / Assessment and Plan / UC Course  I have reviewed the triage vital signs and the nursing notes.  Pertinent labs & imaging results that were available during my care of the patient were reviewed by me and considered in my medical decision making (see chart for details).    Iron panel and CBC pending.  Patient advised to check MyChart for lab results.  Any abnormal results we will follow-up with you via phone. Final Clinical Impressions(s) / UC Diagnoses   Final diagnoses:  Other iron deficiency anemia   Discharge Instructions   None    ED Prescriptions    None     PDMP not reviewed this encounter.   Bing Neighbors, FNP 05/16/20 1733

## 2020-05-18 ENCOUNTER — Emergency Department (HOSPITAL_COMMUNITY)
Admission: EM | Admit: 2020-05-18 | Discharge: 2020-05-19 | Disposition: A | Discharge status: Home / Self Care | Payer: Medicaid Other | Attending: Emergency Medicine | Admitting: Emergency Medicine

## 2020-05-18 ENCOUNTER — Emergency Department (HOSPITAL_COMMUNITY): Payer: Medicaid Other

## 2020-05-18 ENCOUNTER — Other Ambulatory Visit: Payer: Self-pay

## 2020-05-18 ENCOUNTER — Encounter (HOSPITAL_COMMUNITY): Payer: Self-pay

## 2020-05-18 DIAGNOSIS — R519 Headache, unspecified: Secondary | ICD-10-CM | POA: Diagnosis not present

## 2020-05-18 DIAGNOSIS — J45909 Unspecified asthma, uncomplicated: Secondary | ICD-10-CM | POA: Insufficient documentation

## 2020-05-18 DIAGNOSIS — K0889 Other specified disorders of teeth and supporting structures: Secondary | ICD-10-CM | POA: Diagnosis not present

## 2020-05-18 DIAGNOSIS — R220 Localized swelling, mass and lump, head: Secondary | ICD-10-CM | POA: Diagnosis present

## 2020-05-18 LAB — CBC WITH DIFFERENTIAL/PLATELET
Abs Immature Granulocytes: 0.01 10*3/uL (ref 0.00–0.07)
Basophils Absolute: 0 10*3/uL (ref 0.0–0.1)
Basophils Relative: 0 %
Eosinophils Absolute: 0.1 10*3/uL (ref 0.0–0.5)
Eosinophils Relative: 1 %
HCT: 35.2 % — ABNORMAL LOW (ref 36.0–46.0)
Hemoglobin: 10.9 g/dL — ABNORMAL LOW (ref 12.0–15.0)
Immature Granulocytes: 0 %
Lymphocytes Relative: 14 %
Lymphs Abs: 1.1 10*3/uL (ref 0.7–4.0)
MCH: 24.2 pg — ABNORMAL LOW (ref 26.0–34.0)
MCHC: 31 g/dL (ref 30.0–36.0)
MCV: 78.2 fL — ABNORMAL LOW (ref 80.0–100.0)
Monocytes Absolute: 0.6 10*3/uL (ref 0.1–1.0)
Monocytes Relative: 7 %
Neutro Abs: 6.4 10*3/uL (ref 1.7–7.7)
Neutrophils Relative %: 78 %
Platelets: 227 10*3/uL (ref 150–400)
RBC: 4.5 MIL/uL (ref 3.87–5.11)
RDW: 15 % (ref 11.5–15.5)
WBC: 8.2 10*3/uL (ref 4.0–10.5)
nRBC: 0 % (ref 0.0–0.2)

## 2020-05-18 LAB — I-STAT BETA HCG BLOOD, ED (MC, WL, AP ONLY): I-stat hCG, quantitative: <5 m[IU]/mL (ref <5)

## 2020-05-18 LAB — BASIC METABOLIC PANEL
Anion gap: 12 (ref 5–15)
BUN: 10 mg/dL (ref 6–20)
CO2: 23 mmol/L (ref 22–32)
Calcium: 9.3 mg/dL (ref 8.9–10.3)
Chloride: 101 mmol/L (ref 98–111)
Creatinine, Ser: 0.66 mg/dL (ref 0.44–1.00)
GFR, Estimated: >60 mL/min (ref >60)
Glucose, Bld: 79 mg/dL (ref 70–99)
Potassium: 3.3 mmol/L — ABNORMAL LOW (ref 3.5–5.1)
Sodium: 136 mmol/L (ref 135–145)

## 2020-05-18 MED ORDER — KETOROLAC TROMETHAMINE 30 MG/ML IJ SOLN
30.0000 mg | Freq: Once | INTRAMUSCULAR | Status: AC
  Administered 2020-05-18: 30 mg via INTRAVENOUS
  Filled 2020-05-18: qty 1

## 2020-05-18 MED ORDER — ONDANSETRON HCL 4 MG/2ML IJ SOLN
4.0000 mg | Freq: Once | INTRAMUSCULAR | Status: AC
  Administered 2020-05-18: 4 mg via INTRAVENOUS
  Filled 2020-05-18: qty 2

## 2020-05-18 MED ORDER — SODIUM CHLORIDE 0.9 % IV BOLUS
1000.0000 mL | Freq: Once | INTRAVENOUS | Status: AC
  Administered 2020-05-18: 1000 mL via INTRAVENOUS

## 2020-05-18 MED ORDER — IOHEXOL 300 MG/ML  SOLN
75.0000 mL | Freq: Once | INTRAMUSCULAR | Status: AC | PRN
  Administered 2020-05-18: 75 mL via INTRAVENOUS

## 2020-05-18 MED ORDER — DEXAMETHASONE SODIUM PHOSPHATE 10 MG/ML IJ SOLN
10.0000 mg | Freq: Once | INTRAMUSCULAR | Status: AC
  Administered 2020-05-18: 10 mg via INTRAVENOUS
  Filled 2020-05-18: qty 1

## 2020-05-18 MED ORDER — FENTANYL CITRATE (PF) 100 MCG/2ML IJ SOLN
50.0000 ug | Freq: Once | INTRAMUSCULAR | Status: AC
  Administered 2020-05-18: 50 ug via INTRAVENOUS
  Filled 2020-05-18: qty 2

## 2020-05-18 MED ORDER — SODIUM CHLORIDE (PF) 0.9 % IJ SOLN
INTRAMUSCULAR | Status: AC
  Filled 2020-05-18: qty 50

## 2020-05-18 NOTE — ED Provider Notes (Signed)
[facility]ENC[facility] Provider Note   CSN: [id number] Arrival date & time: 05/18/20  1557     History Chief Complaint  Patient presents with  . Facial Pain    Valerie Newman is a 19 y.o. female who presents for evaluation of facial pain and swelling.  Patient reports that yesterday, she had all 4 wisdom teeth removed.  She was discharged home with antibiotics and hydrocodone which she states she has been taking.  Her last dose was at approximately 5 PM.  She reports that since this happened, she started having swelling to her bilateral face.  She states that this has been getting worse.  She states she has been applying ice.  She reports that today, she feels like she is having trouble swallowing secondary to the swelling.  She has not had any vomiting.  No difficulty breathing.  Does report that occasionally, she will feel like she has trouble swallowing her saliva.  Mom denies any fevers.  No overlying warmth, erythema face.  No vomiting.  The history is provided by the patient.       Past Medical History:  Diagnosis Date  . 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 Allergy to pollen   . Anemia   . Anxiety   . Asthma     Patient Active Problem List   Diagnosis Date Noted  . Nexplanon insertion   . Chlamydia infection 10/15/2017    Past Surgical History:  Procedure Laterality Date  . TOOTH EXTRACTION       OB History    Gravida  1   Para  1   Term  1   Preterm  0   AB  0   Living  1     SAB  0   TAB  0   Ectopic  0   Multiple  0   Live Births  1           Family History  Problem Relation Age of Onset  . Heart disease Mother   . Stroke Mother   . Diabetes Maternal Grandmother     Social History   Tobacco Use  . Smoking status: Never Smoker  . Smokeless tobacco: Never Used  Vaping Use  . Vaping Use: Never used  Substance Use Topics  . Alcohol use: No  . Drug use: No    Home Medications Prior to Admission  medications   Medication Sig Start Date End Date Taking? Authorizing Provider  HYDROcodone-homatropine (HYCODAN) 5-1.5 MG/5ML syrup Take 5 mLs by mouth every 6 (six) hours as needed for cough. 05/19/20   Maxwell Caul, PA-C  omeprazole (PRILOSEC) 20 MG capsule Take 1 capsule (20 mg total) by mouth daily. 03/13/20   [name], NP  predniSONE (DELTASONE) 20 MG tablet Take 2 tablets (40 mg total) by mouth daily for 4 days. 05/19/20 05/23/20  Maxwell Caul, PA-C    Allergies    Bee venom and Wasp venom  Review of Systems   Review of Systems  Constitutional: Negative for fever.  HENT: Positive for dental problem, facial swelling and trouble swallowing.   Respiratory: Negative for shortness of breath.   All other systems reviewed and are negative.   Physical Exam Updated Vital Signs BP 111/79   Pulse 88   Temp 99.6 F (37.6 C) (Oral)   Resp 16   LMP 05/04/2020   SpO2 100%   Physical Exam Vitals and nursing note reviewed.  Constitutional:      Appearance: She is well-developed.  HENT:     Head: Normocephalic and atraumatic.     Comments: Diffuse swelling noted to bilateral signs of lower face.  No overlying warmth, erythema.  No crepitus noted.  Limited evaluation of mouth.  She is able to get about 2 finger widths open.  No obvious hemorrhage, hematoma.  No obvious swelling that would indicate abscess. Eyes:     General: No scleral icterus.       Right eye: No discharge.        Left eye: No discharge.     Conjunctiva/sclera: Conjunctivae normal.  Pulmonary:     Effort: Pulmonary effort is normal.     Comments: Lungs clear to auscultation bilaterally.  Symmetric chest rise.  No wheezing, rales, rhonchi Skin:    General: Skin is warm and dry.  Neurological:     Mental Status: She is alert.  Psychiatric:        Speech: Speech normal.        Behavior: Behavior normal.     ED Results / Procedures / Treatments   Labs (all labs ordered are listed, but only  abnormal results are displayed) Labs Reviewed  BASIC METABOLIC PANEL - Abnormal; Notable for the following components:      Result Value   Potassium 3.3 (*)    All other components within normal limits  CBC WITH DIFFERENTIAL/PLATELET - Abnormal; Notable for the following components:   Hemoglobin 10.9 (*)    HCT 35.2 (*)    MCV 78.2 (*)    MCH 24.2 (*)    All other components within normal limits  I-STAT BETA HCG BLOOD, ED ([facility], [facility], [facility] ONLY)    EKG None  Radiology CT Soft Tissue Neck W Contrast  Addendum Date: 05/18/2020   ADDENDUM REPORT: 05/18/2020 23:37 ADDENDUM: There is a large amount of lower facial edema without abscess or drainable fluid collection. Electronically Signed   By: Deatra Robinson M.D.   On: 05/18/2020 23:37   Addendum Date: 05/18/2020   ADDENDUM REPORT: 05/18/2020 23:10 ADDENDUM: Clarification to impression: The cavities at the resection sites of the third molars are expected post-operative findings. The dehiscence at the root of tooth 10 is likely chronic and probably due to apical disease of tooth 11 (which has been extracted). Electronically Signed   By: Deatra Robinson M.D.   On: 05/18/2020 23:10   Result Date: 05/18/2020 CLINICAL DATA:  Facial swelling EXAM: CT NECK WITH CONTRAST TECHNIQUE: Multidetector CT imaging of the neck was performed using the standard protocol following the bolus administration of intravenous contrast. CONTRAST:  38mL OMNIPAQUE IOHEXOL 300 MG/ML  SOLN COMPARISON:  None. FINDINGS: PHARYNX AND LARYNX: Prominent adenoid tonsils, not unexpected for age. There are cavities at the sites of recent resected third molars. There is no subperiosteal abscess. No retropharyngeal or peritonsillar abscess. There is a large periapical lucency at the root of tooth 10 with dehiscence of the anterior maxillary cortex. SALIVARY GLANDS: Normal parotid, submandibular and sublingual glands. THYROID: Normal. LYMPH NODES: 17 mm left supraclavicular lymph node VASCULAR:  Major cervical vessels are patent. LIMITED INTRACRANIAL: Normal. VISUALIZED ORBITS: Normal. MASTOIDS AND VISUALIZED PARANASAL SINUSES: No fluid levels or advanced mucosal thickening. No mastoid effusion. SKELETON: No bony spinal canal stenosis. No lytic or blastic lesions. UPPER CHEST: Clear. OTHER: None. IMPRESSION: 1. Multiple cavities at the sites of recent resected third molars. No subperiosteal abscess or retropharyngeal or peritonsillar abscess. 2. Large periapical lucency at the  root of tooth 10 with dehiscence of the anterior maxillary cortex. Electronically Signed: By: Deatra RobinsonKevin  Herman M.D. On: 05/18/2020 22:30    Procedures Procedures (including critical care time)  Medications Ordered in ED Medications  sodium chloride (PF) 0.9 % injection (has no administration in time range)  sodium chloride 0.9 % bolus 1,000 mL (0 mLs Intravenous Stopped 05/18/20 2248)  ondansetron (ZOFRAN) injection 4 mg (4 mg Intravenous Given 05/18/20 2036)  fentaNYL (SUBLIMAZE) injection 50 mcg (50 mcg Intravenous Given 05/18/20 2036)  dexamethasone (DECADRON) injection 10 mg (10 mg Intravenous Given 05/18/20 2036)  iohexol (OMNIPAQUE) 300 MG/ML solution 75 mL (75 mLs Intravenous Contrast Given 05/18/20 2152)  ketorolac (TORADOL) 30 MG/ML injection 30 mg (30 mg Intravenous Given 05/18/20 2320)    ED Course  I have reviewed the triage vital signs and the nursing notes.  Pertinent labs & imaging results that were available during my care of the patient were reviewed by me and considered in my medical decision making (see chart for details).    MDM Rules/Calculators/A&P                          19 year old female who presents for evaluation of facial pain, swelling that began after she got her wisdom teeth removed yesterday.  No fevers.  Does report now she is feeling she has been a little difficulty swallowing.  Initially arrival, she is afebrile, nontoxic-appearing.  Vital signs are stable.  On exam, she does have  diffuse swelling of the face noted bilaterally.  No overlying warmth, erythema.  Not see any signs of intraoral abscess.  No crepitus noted.  No evidence of respiratory distress.  She is occasionally spitting out her saliva but also will occasionally swallow some of it.  Given the degree of swelling that she has, will obtain blood work, imaging.  Will give patient Decadron, analgesics.  I-STAT beta negative.  CBC shows no leukocytosis.  Hemoglobin is 10.9.  CT shows multiple cavities.  No subperiosteal abscess or retropharyngeal or peritonsillar abscess.  She has some periapical lucency at tooth 10 with some dehiscence at the root of tooth 10.  I discussed this with the radiologist.  He feels like this is most likely chronic in nature and is some destruction of the bone.  Reevaluation.  Patient still swollen but does states she feels better.  She is able to open her mouth little bit more and is able to actually talk to me on exam.  We will give her additional medication.  Reevaluation.  Patient is talking.  She can open up her mouth more.  She has been able to tolerate p.o. here in the emergency department and is asking for chicken broth. .  She does report that the pain with swallowing has significantly improved since analgesics here in the emergency department.  At this time, she is hemodynamically stable.  She is tolerating her secretions and tolerating p.o.  At this time, her work-up is not concerning for an abscess.  Her work-up is reassuring.  She did feel relief with the Decadron so we will plan to send her home on some prednisone as well some liquid pain medication.  At this time, do not feel that this warrants admission. I discussed this with both patient and mom.  They are comfortable with plan.  I instructed patient to call her oral surgeon as soon as possible for follow-up.  Patient instructed to closely monitor symptoms and return to the  emergency department for any worsening concerning  symptoms. At this time, patient exhibits no emergent life-threatening condition that require further evaluation in ED. Discussed patient with Dr. Rubin Payor who is agreeable. Patient had ample opportunity for questions and discussion. All patient's questions were answered with full understanding. Strict return precautions discussed. Patient expresses understanding and agreement to plan.   Portions of this note were generated with Scientist, clinical (histocompatibility and immunogenetics). Dictation errors may occur despite best attempts at proofreading.   Final Clinical Impression(s) / ED Diagnoses Final diagnoses:  Facial swelling  Pain, dental    Rx / DC Orders ED Discharge Orders         Ordered    HYDROcodone-homatropine (HYCODAN) 5-1.5 MG/5ML syrup  Every 6 hours PRN        05/19/20 0003    predniSONE (DELTASONE) 20 MG tablet  Daily        05/19/20 0003           Maxwell Caul, PA-C 05/19/20 Doreen Beam, MD 05/20/20 0021

## 2020-05-18 NOTE — ED Triage Notes (Signed)
Pt BIBA from home-  Per EMS- Pt c/o bilateral facial pain and swelling after having wisdom teeth removed yesterday.   AOx4, ambulatory in triage.

## 2020-05-18 NOTE — ED Notes (Signed)
Patient given fluids for PO challenge.  

## 2020-05-19 MED ORDER — HYDROCODONE-HOMATROPINE 5-1.5 MG/5ML PO SYRP: 5.0000 mL | ORAL_SOLUTION | Freq: Four times a day (QID) | ORAL | 0 refills | Status: DC | PRN

## 2020-05-19 MED ORDER — PREDNISONE 20 MG PO TABS: 40.0000 mg | ORAL_TABLET | Freq: Every day | ORAL | 0 refills | Status: AC

## 2020-05-19 NOTE — Discharge Instructions (Signed)
As we discussed, it is very important for you continue taking your antibiotics.  Also take prednisone as this will help with swelling.  I provided you liquid pain medication that he can take.  Call your oral surgeon as soon as possible and arrange for follow-up.  If it anytime, you feel like the swelling is getting worse, you are having difficulty swallowing or breathing,

## 2020-05-27 ENCOUNTER — Other Ambulatory Visit: Payer: Self-pay

## 2020-05-27 ENCOUNTER — Ambulatory Visit (HOSPITAL_COMMUNITY)
Admission: EM | Admit: 2020-05-27 | Discharge: 2020-05-27 | Disposition: A | Discharge status: Home / Self Care | Payer: Medicaid Other | Attending: Student | Admitting: Student

## 2020-05-27 ENCOUNTER — Encounter (HOSPITAL_COMMUNITY): Payer: Self-pay

## 2020-05-27 DIAGNOSIS — N898 Other specified noninflammatory disorders of vagina: Secondary | ICD-10-CM | POA: Insufficient documentation

## 2020-05-27 LAB — POCT URINALYSIS DIPSTICK, ED / UC
Bilirubin Urine: NEGATIVE
Glucose, UA: NEGATIVE mg/dL
Hgb urine dipstick: NEGATIVE
Ketones, ur: NEGATIVE mg/dL
Leukocytes,Ua: NEGATIVE
Nitrite: NEGATIVE
Protein, ur: NEGATIVE mg/dL
Specific Gravity, Urine: 1.025 (ref 1.005–1.030)
Urobilinogen, UA: 0.2 mg/dL (ref 0.0–1.0)
pH: 7.5 (ref 5.0–8.0)

## 2020-05-27 LAB — HIV ANTIBODY (ROUTINE TESTING W REFLEX): HIV Screen 4th Generation wRfx: NONREACTIVE

## 2020-05-27 LAB — POC URINE PREG, ED: Preg Test, Ur: NEGATIVE

## 2020-05-27 NOTE — ED Triage Notes (Signed)
Pt in requesting Std testing after having new sexual partner. States that she noticed some vaginal irritation while in shower last night.  Denies any vaginal discharge, vaginal burning, abdominal pain

## 2020-05-27 NOTE — ED Provider Notes (Addendum)
[facility]    CSN: [id number] Arrival date & time: 05/27/20  8338      History   Chief Complaint Chief Complaint  Patient presents with  . Exposure to STD    HPI Valerie Newman is a 19 y.o. female presenting with vaginal irritation for 2 days following unprotected intercourse with new partner. She is feeling well otherwise- denies vaginal discharge, dyrusia, hematuria, frequency, urgency, back pain, abd pain, n/v/d, fevers/chills. History of STI earlier this year, as well as BV.   HPI  Past Medical History:  Diagnosis Date  . 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 Allergy to pollen   . Anemia   . Anxiety   . Asthma     Patient Active Problem List   Diagnosis Date Noted  . Nexplanon insertion   . Chlamydia infection 10/15/2017    Past Surgical History:  Procedure Laterality Date  . TOOTH EXTRACTION      OB History    Gravida  1   Para  1   Term  1   Preterm  0   AB  0   Living  1     SAB  0   IAB  0   Ectopic  0   Multiple  0   Live Births  1            Home Medications    Prior to Admission medications   Medication Sig Start Date End Date Taking? Authorizing Provider  HYDROcodone-homatropine (HYCODAN) 5-1.5 MG/5ML syrup Take 5 mLs by mouth every 6 (six) hours as needed for cough. 05/19/20   Maxwell Caul, PA-C  omeprazole (PRILOSEC) 20 MG capsule Take 1 capsule (20 mg total) by mouth daily. 03/13/20   [name], NP    Family History Family History  Problem Relation Age of Onset  . Heart disease Mother   . Stroke Mother   . Diabetes Maternal Grandmother     Social History Social History   Tobacco Use  . Smoking status: Never Smoker  . Smokeless tobacco: Never Used  Vaping Use  . Vaping Use: Never used  Substance Use Topics  . Alcohol use: No  . Drug use: No     Allergies   Bee venom and Wasp venom   Review of Systems Review of Systems  Constitutional: Negative for appetite change, chills  and fever.  Gastrointestinal: Negative for abdominal pain, constipation, diarrhea, nausea and vomiting.  Genitourinary: Positive for vaginal pain (external vaginal irritation ). Negative for dysuria, flank pain, frequency, genital sores, hematuria, pelvic pain, vaginal bleeding and vaginal discharge.  Skin: Negative for rash.     Physical Exam Triage Vital Signs ED Triage Vitals  Enc Vitals Group     BP 05/27/20 0852 125/85     Pulse Rate 05/27/20 0852 80     Resp 05/27/20 0852 18     Temp 05/27/20 0852 98.2 F (36.8 C)     Temp Source 05/27/20 0852 Oral     SpO2 05/27/20 0852 97 %     Weight --      Height --      Head Circumference --      Peak Flow --      Pain Score 05/27/20 0849 0     Pain Loc --      Pain Edu? --      Excl. in GC? --    No data found.  Updated Vital Signs BP  125/85 (BP Location: Left Arm)   Pulse 80   Temp 98.2 F (36.8 C) (Oral)   Resp 18   LMP 05/18/2020 (Approximate)   SpO2 97%   Visual Acuity Right Eye Distance:   Left Eye Distance:   Bilateral Distance:    Right Eye Near:   Left Eye Near:    Bilateral Near:     Physical Exam Vitals reviewed. Exam conducted with a chaperone present.  Constitutional:      General: She is not in acute distress.    Appearance: Normal appearance. She is normal weight. She is not ill-appearing or diaphoretic.  HENT:     Head: Normocephalic and atraumatic.  Cardiovascular:     Rate and Rhythm: Normal rate and regular rhythm.     Heart sounds: Normal heart sounds.  Pulmonary:     Effort: Pulmonary effort is normal.     Breath sounds: Normal breath sounds.  Abdominal:     General: Abdomen is flat. Bowel sounds are normal. There is no distension.     Palpations: Abdomen is soft. There is no mass.     Tenderness: There is abdominal tenderness in the right lower quadrant and left lower quadrant. There is no right CVA tenderness, left CVA tenderness, guarding or rebound.  Genitourinary:    Labia:         Right: No rash, tenderness, lesion or injury.        Left: No rash, tenderness, lesion or injury.      Vagina: No signs of injury. Vaginal discharge present. No erythema, bleeding or lesions.     Cervix: No discharge, friability, lesion, erythema or cervical bleeding.     Uterus: Normal. Not tender.      Adnexa: Right adnexa normal and left adnexa normal.       Right: No mass, tenderness or fullness.         Left: No mass or tenderness.       Comments: Scant thin white discharge. No cervical motion tenderness. Neurological:     General: No focal deficit present.     Mental Status: She is alert.  Psychiatric:        Mood and Affect: Mood normal.        Behavior: Behavior normal.      UC Treatments / Results  Labs (all labs ordered are listed, but only abnormal results are displayed) Labs Reviewed - No data to display  EKG   Radiology No results found.  Procedures Procedures (including critical care time)  Medications Ordered in UC Medications - No data to display  Initial Impression / Assessment and Plan / UC Course  I have reviewed the triage vital signs and the nursing notes.  Pertinent labs & imaging results that were available during my care of the patient were reviewed by me and considered in my medical decision making (see chart for details).     Pt with few days of external vaginal irritation. Fairly benign exam today, negative urine pregnancy test. Labs as below- G/C, vaginitis panel, HIV, syphillis. Given presentation and exam, plan to await lab results to treat. Pt understands she must return to clinic if worsening symptoms- abd pain, n/v/d, fevers/chills, back pain, etc. She verbalizes understandign and agreement.   Final Clinical Impressions(s) / UC Diagnoses   Final diagnoses:  None   Discharge Instructions   None    ED Prescriptions    None     PDMP not reviewed this encounter.   Ignacia Bayley  E, PA-C 05/27/20 1017    Rhys Martini,  PA-C 05/27/20 1409

## 2020-05-27 NOTE — Discharge Instructions (Signed)
We'll call you with your lab results if they are abnormal. Seek medical attention if you start having any new symptoms like fevers/chills, back pain, abdominal pain, etc.

## 2020-05-28 ENCOUNTER — Telehealth (HOSPITAL_COMMUNITY): Payer: Self-pay | Admitting: Emergency Medicine

## 2020-05-28 LAB — CERVICOVAGINAL ANCILLARY ONLY
Bacterial Vaginitis (gardnerella): NEGATIVE
Candida Glabrata: POSITIVE — AB
Candida Vaginitis: POSITIVE — AB
Chlamydia: NEGATIVE
Comment: NEGATIVE
Comment: NEGATIVE
Comment: NEGATIVE
Comment: NEGATIVE
Comment: NEGATIVE
Comment: NORMAL
Neisseria Gonorrhea: NEGATIVE
Trichomonas: NEGATIVE

## 2020-05-28 LAB — RPR: RPR Ser Ql: NONREACTIVE

## 2020-05-28 MED ORDER — FLUCONAZOLE 150 MG PO TABS: 150.0000 mg | ORAL_TABLET | Freq: Once | ORAL | 0 refills | Status: AC

## 2020-07-03 ENCOUNTER — Encounter (INDEPENDENT_AMBULATORY_CARE_PROVIDER_SITE_OTHER): Payer: Self-pay | Admitting: Primary Care

## 2020-07-03 ENCOUNTER — Ambulatory Visit (INDEPENDENT_AMBULATORY_CARE_PROVIDER_SITE_OTHER): Payer: Medicaid Other | Admitting: Primary Care

## 2020-07-03 ENCOUNTER — Other Ambulatory Visit: Payer: Self-pay

## 2020-07-03 VITALS — BP 108/68 | Resp 18 | Ht 64.0 in | Wt 128.0 lb

## 2020-07-03 DIAGNOSIS — Z30015 Encounter for initial prescription of vaginal ring hormonal contraceptive: Secondary | ICD-10-CM

## 2020-07-03 DIAGNOSIS — Z0000 Encounter for general adult medical examination without abnormal findings: Secondary | ICD-10-CM

## 2020-07-03 DIAGNOSIS — D649 Anemia, unspecified: Secondary | ICD-10-CM | POA: Diagnosis not present

## 2020-07-03 DIAGNOSIS — J45909 Unspecified asthma, uncomplicated: Secondary | ICD-10-CM

## 2020-07-03 MED ORDER — FEXOFENADINE-PSEUDOEPHED ER 60-120 MG PO TB12: 1.0000 | ORAL_TABLET | Freq: Two times a day (BID) | ORAL | 1 refills | Status: DC

## 2020-07-03 MED ORDER — ETONOGESTREL-ETHINYL ESTRADIOL 0.12-0.015 MG/24HR VA RING: VAGINAL_RING | VAGINAL | 12 refills | Status: DC

## 2020-07-03 NOTE — Patient Instructions (Signed)
Etonogestrel; Ethinyl Estradiol Vaginal Ring What is this medicine? ETONOGESTREL; ETHINYL ESTRADIOL (et oh noe JES trel; ETH in il es tra DYE ole) vaginal ring is a flexible, vaginal ring used as a contraceptive (birth control method). This product combines two types of female hormones, an estrogen and a progestin. It is used to prevent ovulation and pregnancy. Each ring is effective for 1 month. This medicine may be used for other purposes; ask your health care provider or pharmacist if you have questions. COMMON BRAND NAME(S): EluRyng, NuvaRing What should I tell my health care provider before I take this medicine? They need to know if you have any of these conditions:  abnormal vaginal bleeding  blood vessel disease or blood clots  breast, cervical, endometrial, ovarian, liver, or uterine cancer  diabetes  gallbladder disease  having surgery  heart disease or recent heart attack  high blood pressure  high cholesterol or triglycerides  history of irregular heartbeat or heart valve problems  kidney disease  liver disease  migraine headaches  protein C deficiency  protein S deficiency  recently had a baby, miscarriage, or abortion  stroke  systemic lupus erythematosus (SLE)  tobacco smoker  your age is more than 21 years old  an unusual or allergic reaction to estrogens, progestins, other medicines, foods, dyes, or preservatives  pregnant or trying to get pregnant  breast-feeding How should I use this medicine? Insert the ring into your vagina as directed. Follow the directions on the prescription label. The ring will remain place for 3 weeks and is then removed for a 1-week break. A new ring is inserted 1 week after the last ring was removed, on the same day of the week. Check often to make sure the ring is still in place. If the ring was out of the vagina for an unknown amount of time, you may not be protected from pregnancy. Perform a pregnancy test and call  your doctor. Do not use more often than directed. A patient package insert for the product will be given with each prescription and refill. Read this sheet carefully each time. The sheet may change frequently. Contact your pediatrician regarding the use of this medicine in children. Special care may be needed. Overdosage: If you think you have taken too much of this medicine contact a poison control center or emergency room at once. NOTE: This medicine is only for you. Do not share this medicine with others. What if I miss a dose? You will need to use the ring exactly as directed. It is very important to follow the schedule every cycle. If you do not use the ring as directed, you may not be protected from pregnancy. If the ring should slip out, is lost, or if you leave it in longer or shorter than you should, contact your health care professional for advice. What may interact with this medicine? Do not take this medicine with the following medications:  dasabuvir; ombitasvir; paritaprevir; ritonavir  ombitasvir; paritaprevir; ritonavir  vaginal lubricants or other vaginal products that are oil-based or silicone-based This medicine may also interact with the following medications:  acetaminophen  antibiotics or medicines for infections, especially rifampin, rifabutin, rifapentine, and griseofulvin, and possibly penicillins or tetracyclines  aprepitant or fosaprepitant  armodafinil  ascorbic acid (vitamin C)  barbiturate medicines, such as phenobarbital or primidone  bosentan  certain antiviral medicines for hepatitis, HIV or AIDS  certain medicines for cancer treatment  certain medicines for seizures like carbamazepine, clobazam, felbamate, lamotrigine, oxcarbazepine, phenytoin, rufinamide,   topiramate  certain medicines for treating high cholesterol  cyclosporine  dantrolene  elagolix  flibanserin  grapefruit juice  lesinurad  medicines for diabetes  medicines to  treat fungal infections, such as griseofulvin, miconazole, fluconazole, ketoconazole, itraconazole, posaconazole or voriconazole  mifepristone  mitotane  modafinil  morphine  mycophenolate  St. John's wort  tamoxifen  temazepam  theophylline or aminophylline  thyroid hormones  tizanidine  tranexamic acid  ulipristal  warfarin This list may not describe all possible interactions. Give your health care provider a list of all the medicines, herbs, non-prescription drugs, or dietary supplements you use. Also tell them if you smoke, drink alcohol, or use illegal drugs. Some items may interact with your medicine. What should I watch for while using this medicine? Visit your doctor or health care professional for regular checks on your progress. You will need a regular breast and pelvic exam and Pap smear while on this medicine. Check with your doctor or health care professional to see if you need an additional method of contraception during the first cycle that you use this ring. Female condoms (made with natural rubber latex, polyisoprene, and polyurethane) and spermicides may be used. Do not use a diaphragm, cervical cap, or a female condom, as the ring can interfere with these birth control methods and their proper placement. If you have any reason to think you are pregnant, stop using this medicine right away and contact your doctor or health care professional. If you are using this medicine for hormone related problems, it may take several cycles of use to see improvement in your condition. Smoking increases the risk of getting a blood clot or having a stroke while you are using hormonal birth control, especially if you are more than 21 years old. You are strongly advised not to smoke. Some women are prone to getting dark patches on the skin of the face (cholasma). Your risk of getting chloasma with this medicine is higher if you had chloasma during a pregnancy. Keep out of the sun.  If you cannot avoid being in the sun, wear protective clothing and use sunscreen. Do not use sun lamps or tanning beds/booths. This medicine can make your body retain fluid, making your fingers, hands, or ankles swell. Your blood pressure can go up. Contact your doctor or health care professional if you feel you are retaining fluid. If you are going to have elective surgery, you may need to stop using this medicine before the surgery. Consult your health care professional for advice. This medicine does not protect you against HIV infection (AIDS) or any other sexually transmitted diseases. What side effects may I notice from receiving this medicine? Side effects that you should report to your doctor or health care professional as soon as possible:  allergic reactions such as skin rash or itching, hives, swelling of the lips, mouth, tongue, or throat  depression  high blood pressure  migraines or severe, sudden headaches  signs and symptoms of a blood clot such as breathing problems; changes in vision; chest pain; severe, sudden headache; pain, swelling, warmth in the leg; trouble speaking; sudden numbness or weakness of the face, arm or leg  signs and symptoms of infection like fever or chills with dizziness and a sunburn-like rash, or pain or trouble passing urine  stomach pain  symptoms of vaginal infection like itching, irritation or unusual discharge  yellowing of the eyes or skin Side effects that usually do not require medical attention (report these to your doctor or   health care professional if they continue or are bothersome):  acne  breast pain, tenderness  irregular vaginal bleeding or spotting, particularly during the first month of use  mild headache  nausea  painful periods  vomiting This list may not describe all possible side effects. Call your doctor for medical advice about side effects. You may report side effects to FDA at 1-800-FDA-1088. Where should I keep  my medicine? Keep out of the reach of children. Store unopened medicine for up to 4 months at room temperature at 15 and 30 degrees C (59 and 86 degrees F). Protect from light. Do not store above 30 degrees C (86 degrees F). Throw away any unused medicine 4 months after the dispense date or the expiration date, whichever comes first. A ring may only be used for 1 cycle (1 month). After the 3-week cycle, a used ring is removed and should be placed in the re-closable foil pouch and discarded in the trash out of reach of children and pets. Do NOT flush down the toilet. NOTE: This sheet is a summary. It may not cover all possible information. If you have questions about this medicine, talk to your doctor, pharmacist, or health care provider.  2021 Elsevier/Gold Standard (2019-04-19 20:36:29)  

## 2020-07-03 NOTE — Progress Notes (Signed)
Patient ID: Valerie Newman, female   DOB: [birth year], 19 y.o.   MRN: [id number]  Chief Complaint  Patient presents with  . Annual Exam    HPI Ms.Shamara Soza is a 20 y.o. female who presents for an annual physical. The only problem she is having is sinus congestion. Discussed birth control options.  Past Medical History:  Diagnosis Date  . 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 Allergy to pollen   . Anemia   . Anxiety   . Asthma     Past Surgical History:  Procedure Laterality Date  . TOOTH EXTRACTION      Family History  Problem Relation Age of Onset  . Heart disease Mother   . Stroke Mother   . Diabetes Maternal Grandmother     Social History Social History   Tobacco Use  . Smoking status: Never Smoker  . Smokeless tobacco: Never Used  Vaping Use  . Vaping Use: Never used  Substance Use Topics  . Alcohol use: No  . Drug use: No    Allergies  Allergen Reactions  . Bee Venom Swelling  . Wasp Venom Swelling    Current Outpatient Medications  Medication Sig Dispense Refill  . etonogestrel-ethinyl estradiol (NUVARING) 0.12-0.015 MG/24HR vaginal ring Insert vaginally and leave in place for 3 consecutive weeks, then remove for 1 week. 1 each 12  . fexofenadine-pseudoephedrine (ALLEGRA-D) 60-120 MG 12 hr tablet Take 1 tablet by mouth 2 (two) times daily. 60 tablet 1   No current facility-administered medications for this visit.    Review of Systems Review of Systems  HENT: Positive for congestion.   All other systems reviewed and are negative.   Blood pressure 108/68, resp. rate 18, height 5\' 4"  (1.626 m), weight 128 lb (58.1 kg), last menstrual period 06/15/2020, SpO2 100 %.  Physical Exam General: Vital signs reviewed.  Patient is well-developed and well-nourished(thin frame), in no acute distress and cooperative with exam.  Head: Normocephalic and atraumatic. Eyes: EOMI, conjunctivae normal, no scleral icterus.  Neck: Supple, trachea midline, normal  ROM, no JVD, masses, thyromegaly, or carotid bruit present.  Cardiovascular: RRR, S1 normal, S2 normal, no murmurs, gallops, or rubs. Pulmonary/Chest: Clear to auscultation bilaterally, no wheezes, rales, or rhonchi. Abdominal: Soft, non-tender, non-distended, BS +, no masses, organomegaly, or guarding present.  Musculoskeletal: No joint deformities, erythema, or stiffness, ROM full and nontender. Extremities: No lower extremity edema bilaterally,  pulses symmetric and intact bilaterally. No cyanosis or clubbing. Neurological: A&O x3, Strength is normal and symmetric bilaterally, cranial nerve II-XII are grossly intact, no focal motor deficit, sensory intact to light touch bilaterally.  Skin: Warm, dry and intact. No rashes or erythema. Psychiatric: Normal mood and affect. speech and behavior is normal. Cognition and memory are normal.    Assessment Valerie Newman was seen today for annual exam.  Diagnoses and all orders for this visit:  Annual physical exam Completed   Asthma due to environmental allergies -     fexofenadine-pseudoephedrine (ALLEGRA-D) 60-120 MG 12 hr tablet; Take 1 tablet by mouth 2 (two) times daily.  Encounter for initial prescription of vaginal ring hormonal contraceptive Reviewed all forms of birth control options available including abstinence; over the counter/barrier methods; hormonal contraceptive medication including pill, patch, ring, injection,contraceptive implant; hormonal and nonhormonal IUDs; permanent sterilization options including vasectomy and the various tubal sterilization modalities. Risks and benefits reviewed. Questions were answered. -     etonogestrel-ethinyl estradiol (NUVARING) 0.12-0.015 MG/24HR vaginal ring; Insert vaginally and leave  in place for 3 consecutive weeks, then remove for 1 week.        [name] 07/03/2020, 12:04 PM

## 2020-07-04 LAB — FE+CBC/D/PLT+TIBC+FER+RETIC
Basophils Absolute: 0 10*3/uL (ref 0.0–0.2)
Basos: 0 %
EOS (ABSOLUTE): 0.2 10*3/uL (ref 0.0–0.4)
Eos: 3 %
Ferritin: 5 ng/mL — ABNORMAL LOW (ref 15–150)
Hematocrit: 32.8 % — ABNORMAL LOW (ref 34.0–46.6)
Hemoglobin: 10.2 g/dL — ABNORMAL LOW (ref 11.1–15.9)
Immature Grans (Abs): 0 10*3/uL (ref 0.0–0.1)
Immature Granulocytes: 0 %
Iron Saturation: 10 % — ABNORMAL LOW (ref 15–55)
Iron: 36 ug/dL (ref 27–159)
Lymphocytes Absolute: 1.5 10*3/uL (ref 0.7–3.1)
Lymphs: 32 %
MCH: 24.2 pg — ABNORMAL LOW (ref 26.6–33.0)
MCHC: 31.1 g/dL — ABNORMAL LOW (ref 31.5–35.7)
MCV: 78 fL — ABNORMAL LOW (ref 79–97)
Monocytes Absolute: 0.3 10*3/uL (ref 0.1–0.9)
Monocytes: 7 %
Neutrophils Absolute: 2.6 10*3/uL (ref 1.4–7.0)
Neutrophils: 58 %
Platelets: 213 10*3/uL (ref 150–450)
RBC: 4.21 x10E6/uL (ref 3.77–5.28)
RDW: 14.5 % (ref 11.7–15.4)
Retic Ct Pct: 1.2 % (ref 0.6–2.6)
Total Iron Binding Capacity: 360 ug/dL (ref 250–450)
UIBC: 324 ug/dL (ref 131–425)
WBC: 4.5 10*3/uL (ref 3.4–10.8)

## 2020-07-04 LAB — FOLATE: Folate: 11.8 ng/mL (ref >3.0)

## 2020-07-08 ENCOUNTER — Other Ambulatory Visit (INDEPENDENT_AMBULATORY_CARE_PROVIDER_SITE_OTHER): Payer: Self-pay | Admitting: Primary Care

## 2020-07-08 MED ORDER — FERROUS SULFATE 325 (65 FE) MG PO TABS: 325.0000 mg | ORAL_TABLET | Freq: Every day | ORAL | 3 refills | Status: DC

## 2020-07-10 ENCOUNTER — Ambulatory Visit (HOSPITAL_COMMUNITY)
Admission: EM | Admit: 2020-07-10 | Discharge: 2020-07-10 | Disposition: A | Discharge status: Home / Self Care | Payer: Medicaid Other | Attending: Medical Oncology | Admitting: Medical Oncology

## 2020-07-10 ENCOUNTER — Encounter (HOSPITAL_COMMUNITY): Payer: Self-pay | Admitting: Emergency Medicine

## 2020-07-10 ENCOUNTER — Other Ambulatory Visit: Payer: Self-pay

## 2020-07-10 DIAGNOSIS — R110 Nausea: Secondary | ICD-10-CM | POA: Insufficient documentation

## 2020-07-10 DIAGNOSIS — N898 Other specified noninflammatory disorders of vagina: Secondary | ICD-10-CM | POA: Insufficient documentation

## 2020-07-10 LAB — POCT URINALYSIS DIPSTICK, ED / UC
Bilirubin Urine: NEGATIVE
Glucose, UA: NEGATIVE mg/dL
Hgb urine dipstick: NEGATIVE
Ketones, ur: NEGATIVE mg/dL
Leukocytes,Ua: NEGATIVE
Nitrite: NEGATIVE
Protein, ur: NEGATIVE mg/dL
Specific Gravity, Urine: >=1.03 (ref 1.005–1.030)
Urobilinogen, UA: 0.2 mg/dL (ref 0.0–1.0)
pH: 6 (ref 5.0–8.0)

## 2020-07-10 LAB — POC URINE PREG, ED: Preg Test, Ur: NEGATIVE

## 2020-07-10 MED ORDER — METRONIDAZOLE 0.75 % VA GEL: 1.0000 | Freq: Two times a day (BID) | VAGINAL | 0 refills | Status: DC

## 2020-07-10 MED ORDER — OMEPRAZOLE 20 MG PO CPDR: 20.0000 mg | DELAYED_RELEASE_CAPSULE | Freq: Every day | ORAL | 0 refills | Status: DC

## 2020-07-10 MED ORDER — ONDANSETRON HCL 4 MG PO TABS: 4.0000 mg | ORAL_TABLET | Freq: Four times a day (QID) | ORAL | 0 refills | Status: DC

## 2020-07-10 NOTE — ED Provider Notes (Signed)
[facility]    CSN: [id number] Arrival date & time: 07/10/20  0803      History   Chief Complaint Chief Complaint  Patient presents with  . Back Pain  . Nausea  . Breast Pain  . Vaginal Discharge    HPI Valerie Newman is a 20 y.o. female.   HPI   Pt presents with a 4 day history of low back pain, nausea, breast tenderness and vaginal discharge. Her back pain has resolved. She has not tried for symptoms for relief.  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 She reports that today's symptoms do not feel similar and she is not concerned about STI at this time as she is routinely screened by her PCP. Last screening per patient was 2 months ago and no new partners of concern since. She denies vomiting, abdominal or pelvic pain, fevers, or dysuria.Of note she was diagnosed with BV recently within the past 2 months and was prescribed flagyl which makes her vomit.  [facility]: 06/15/2020. She is not currently using birth control but was previously on nuvaring.   Past Medical History:  Diagnosis Date  . 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 Allergy to pollen   . Anemia   . Anxiety   . Asthma     Patient Active Problem List   Diagnosis Date Noted  . Nexplanon insertion   . Chlamydia infection 10/15/2017    Past Surgical History:  Procedure Laterality Date  . TOOTH EXTRACTION      OB History    Gravida  1   Para  1   Term  1   Preterm  0   AB  0   Living  1     SAB  0   IAB  0   Ectopic  0   Multiple  0   Live Births  1            Home Medications    Prior to Admission medications   Medication Sig Start Date End Date Taking? Authorizing Provider  metroNIDAZOLE (METROGEL) 0.75 % vaginal gel Place 1 Applicatorful vaginally 2 (two) times daily. 07/10/20  Yes Covington, Sarah M, PA-C  omeprazole (PRILOSEC) 20 MG capsule Take 1 capsule (20 mg total) by mouth daily.  07/10/20  Yes Covington, Maralyn Sago M, PA-C  ondansetron (ZOFRAN) 4 MG tablet Take 1 tablet (4 mg total) by mouth every 6 (six) hours. 07/10/20  Yes Rushie Chestnut, PA-C  etonogestrel-ethinyl estradiol (NUVARING) 0.12-0.015 MG/24HR vaginal ring Insert vaginally and leave in place for 3 consecutive weeks, then remove for 1 week. 07/03/20   [name], NP  ferrous sulfate 325 (65 FE) MG tablet Take 1 tablet (325 mg total) by mouth daily with breakfast. 07/08/20   [name], NP  fexofenadine-pseudoephedrine (ALLEGRA-D) 60-120 MG 12 hr tablet Take 1 tablet by mouth 2 (two) times daily. 07/03/20   [name], NP    Family History Family History  Problem Relation Age of Onset  . Heart disease Mother   . Stroke Mother   . Diabetes Maternal Grandmother     Social History Social History   Tobacco Use  . Smoking status: Never Smoker  . Smokeless tobacco: Never Used  Vaping Use  . Vaping Use: Never used  Substance Use Topics  . Alcohol use: No  . Drug use: No  Allergies   Bee venom and Wasp venom   Review of Systems Review of Systems  Constitutional: Negative for fever.  Gastrointestinal: Positive for nausea. Negative for abdominal pain, constipation, diarrhea and vomiting.  Genitourinary: Positive for vaginal discharge. Negative for dysuria, flank pain, frequency, genital sores, pelvic pain, urgency, vaginal bleeding and vaginal pain.     Physical Exam Triage Vital Signs ED Triage Vitals  Enc Vitals Group     BP 07/10/20 0838 117/74     Pulse Rate 07/10/20 0838 87     Resp 07/10/20 0838 18     Temp 07/10/20 0838 98.4 F (36.9 C)     Temp Source 07/10/20 0838 Oral     SpO2 07/10/20 0838 100 %     Weight --      Height --      Head Circumference --      Peak Flow --      Pain Score 07/10/20 0837 0     Pain Loc --      Pain Edu? --      Excl. in GC? --    No data found.  Updated Vital Signs BP 117/74 (BP Location: Left Arm)   Pulse 87    Temp 98.4 F (36.9 C) (Oral)   Resp 18   LMP 06/15/2020 (Exact Date)   SpO2 100%   Physical Exam Vitals and nursing note reviewed.  Constitutional:      General: She is not in acute distress.    Appearance: Normal appearance. She is not ill-appearing or toxic-appearing.  HENT:     Head: Normocephalic.  Cardiovascular:     Rate and Rhythm: Normal rate and regular rhythm.     Heart sounds: Normal heart sounds. No murmur heard. No gallop.   Pulmonary:     Effort: Pulmonary effort is normal.     Breath sounds: Normal breath sounds. No wheezing.  Abdominal:     General: Abdomen is flat. Bowel sounds are normal. There is no distension.     Palpations: Abdomen is soft. There is no mass.     Tenderness: There is no abdominal tenderness. There is no right CVA tenderness, left CVA tenderness, guarding or rebound.     Hernia: No hernia is present.  Neurological:     Mental Status: She is alert.      UC Treatments / Results  Labs (all labs ordered are listed, but only abnormal results are displayed) Labs Reviewed  POCT URINALYSIS DIPSTICK, ED / UC  POC URINE PREG, ED  POC URINE PREG, ED  CERVICOVAGINAL ANCILLARY ONLY   Radiology No results found.  Procedures Procedures (including critical care time)  Medications Ordered in UC Medications - No data to display  Initial Impression / Assessment and Plan / UC Course  I have reviewed the triage vital signs and the nursing notes.  Pertinent labs & imaging results that were available during my care of the patient were reviewed by me and considered in my medical decision making (see chart for details).     Reviewed her labs today which are negative for pregnancy and a nonacute UA.  This likely is worsening bacterial vaginosis infection.  Since she has not had any treatment as she is unable to tolerate oral Flagyl we are going to treat with vaginal metronidazole cream.  I discussed with her how to use this cream.  In addition we  discussed a brat diet and taking omeprazole x14 days to help with her nausea.  Also  sending in Zofran for her to use.  I discussed these medications including how to take, common potential side effects and precautions.  She will follow up with her PCP should symptoms worsen or fail to improve.  We also discussed safe sex practices such as using a condom.   Final Clinical Impressions(s) / UC Diagnoses   Final diagnoses:  Nausea without vomiting  Vaginal discharge   Discharge Instructions   None    ED Prescriptions    Medication Sig Dispense Auth. Provider   omeprazole (PRILOSEC) 20 MG capsule Take 1 capsule (20 mg total) by mouth daily. 14 capsule Covington, Sarah M, PA-C   ondansetron (ZOFRAN) 4 MG tablet Take 1 tablet (4 mg total) by mouth every 6 (six) hours. 12 tablet Covington, Sarah M, PA-C   metroNIDAZOLE (METROGEL) 0.75 % vaginal gel Place 1 Applicatorful vaginally 2 (two) times daily. 70 g Covington, Sarah M, [place]     PDMP not reviewed this encounter.   Rushie Chestnut, [place] 07/10/20 [phone]

## 2020-07-10 NOTE — ED Triage Notes (Signed)
Pt presents with nausea, back pain, vaginal discharge and breast tenderness xs 4 days.

## 2020-07-11 ENCOUNTER — Telehealth (HOSPITAL_COMMUNITY): Payer: Self-pay | Admitting: Emergency Medicine

## 2020-07-11 LAB — CERVICOVAGINAL ANCILLARY ONLY
Bacterial Vaginitis (gardnerella): NEGATIVE
Candida Glabrata: POSITIVE — AB
Candida Vaginitis: POSITIVE — AB
Chlamydia: NEGATIVE
Comment: NEGATIVE
Comment: NEGATIVE
Comment: NEGATIVE
Comment: NEGATIVE
Comment: NEGATIVE
Comment: NORMAL
Neisseria Gonorrhea: NEGATIVE
Trichomonas: NEGATIVE

## 2020-07-11 MED ORDER — FLUCONAZOLE 150 MG PO TABS: 150.0000 mg | ORAL_TABLET | Freq: Once | ORAL | 0 refills | Status: AC

## 2020-07-18 ENCOUNTER — Telehealth (INDEPENDENT_AMBULATORY_CARE_PROVIDER_SITE_OTHER): Payer: Medicaid Other | Admitting: Primary Care

## 2020-07-18 ENCOUNTER — Other Ambulatory Visit: Payer: Self-pay

## 2020-07-18 DIAGNOSIS — N760 Acute vaginitis: Secondary | ICD-10-CM

## 2020-07-18 DIAGNOSIS — B9689 Other specified bacterial agents as the cause of diseases classified elsewhere: Secondary | ICD-10-CM

## 2020-07-18 NOTE — Progress Notes (Signed)
Telephone Note  I connected with Valerie Newman on 07/18/20 at  2:50 PM EST by telephone and verified that I am speaking with the correct person using two identifiers.  Location: Patient: home Provider [name] @ [facility]   I discussed the limitations, risks, security and privacy concerns of performing an evaluation and management service by telephone and the availability of in person appointments. I also discussed with the patient that there may be a patient responsible charge related to this service. The patient expressed understanding and agreed to proceed.   History of Present Illness: [name] is a 20 year old female  having a tele visit due to unable to afford medication stated over $200- return to pharmacy and would call back . Problem patient insurance is no longer effective therefore the medication cost. Advised to call her case worker and asked for guidance to have her Medicaid restarted and not for family planning only. Explained Family planning is for birth control and gyn visit and one annual visit. Patient agrees and understands.  Past Medical History:  Diagnosis Date  . 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 Allergy to pollen   . Anemia   . Anxiety   . Asthma    Current Outpatient Medications on File Prior to Visit  Medication Sig Dispense Refill  . etonogestrel-ethinyl estradiol (NUVARING) 0.12-0.015 MG/24HR vaginal ring Insert vaginally and leave in place for 3 consecutive weeks, then remove for 1 week. 1 each 12  . ferrous sulfate 325 (65 FE) MG tablet Take 1 tablet (325 mg total) by mouth daily with breakfast. 90 tablet 3  . fexofenadine-pseudoephedrine (ALLEGRA-D) 60-120 MG 12 hr tablet Take 1 tablet by mouth 2 (two) times daily. 60 tablet 1  . metroNIDAZOLE (METROGEL) 0.75 % vaginal gel Place 1 Applicatorful vaginally 2 (two) times daily. 70 g 0  . omeprazole (PRILOSEC) 20 MG capsule Take 1 capsule (20 mg total) by mouth daily. 14 capsule 0  .  ondansetron (ZOFRAN) 4 MG tablet Take 1 tablet (4 mg total) by mouth every 6 (six) hours. 12 tablet 0   No current facility-administered medications on file prior to visit.   Observations/Objective: ROS negative except vaginal discharge   Assessment and Plan: Savaya was seen today for medication refill.  Diagnoses and all orders for this visit:  BV (bacterial vaginosis) Unable to get prescriptions filled due to insurance issues. Refer to HPI     Follow Up Instructions:    I discussed the assessment and treatment plan with the patient. The patient was provided an opportunity to ask questions and all were answered. The patient agreed with the plan and demonstrated an understanding of the instructions.   The patient was advised to call back or seek an in-person evaluation if the symptoms worsen or if the condition fails to improve as anticipated.  I provided 20 minutes of non-face-to-face time during this encounter.   [name], NP

## 2020-07-18 NOTE — Progress Notes (Signed)
Diflucan needs to be refilled and sent to [facility] pharmacy

## 2020-07-22 ENCOUNTER — Telehealth (INDEPENDENT_AMBULATORY_CARE_PROVIDER_SITE_OTHER): Payer: Self-pay

## 2020-07-22 NOTE — Telephone Encounter (Signed)
Copied from CRM [id number]. Topic: General - Other >> Jul 22, 2020  3:39 PM Jaquita Rector A wrote: Reason for CRM: Patient called in to inform Efrain Sella that she have not had a bowel movement in more than 3 days. Say that she is taking the medication prescribed but that it is not helping her any.   Would like a call back at Ph# [phone]

## 2020-07-23 ENCOUNTER — Other Ambulatory Visit: Payer: Self-pay

## 2020-07-23 ENCOUNTER — Emergency Department (HOSPITAL_COMMUNITY): Payer: Medicaid Other

## 2020-07-23 ENCOUNTER — Encounter (HOSPITAL_COMMUNITY): Payer: Self-pay | Admitting: Emergency Medicine

## 2020-07-23 ENCOUNTER — Emergency Department (HOSPITAL_COMMUNITY)
Admission: EM | Admit: 2020-07-23 | Discharge: 2020-07-23 | Disposition: A | Discharge status: Home / Self Care | Payer: Medicaid Other | Attending: Emergency Medicine | Admitting: Emergency Medicine

## 2020-07-23 DIAGNOSIS — J45909 Unspecified asthma, uncomplicated: Secondary | ICD-10-CM | POA: Insufficient documentation

## 2020-07-23 DIAGNOSIS — R109 Unspecified abdominal pain: Secondary | ICD-10-CM | POA: Diagnosis present

## 2020-07-23 DIAGNOSIS — R1084 Generalized abdominal pain: Secondary | ICD-10-CM | POA: Insufficient documentation

## 2020-07-23 DIAGNOSIS — K5900 Constipation, unspecified: Secondary | ICD-10-CM

## 2020-07-23 LAB — I-STAT BETA HCG BLOOD, ED (MC, WL, AP ONLY): I-stat hCG, quantitative: <5 m[IU]/mL (ref <5)

## 2020-07-23 LAB — URINALYSIS, ROUTINE W REFLEX MICROSCOPIC
Glucose, UA: NEGATIVE mg/dL
Hgb urine dipstick: NEGATIVE
Ketones, ur: 15 mg/dL — AB
Leukocytes,Ua: NEGATIVE
Nitrite: POSITIVE — AB
Protein, ur: 100 mg/dL — AB
Specific Gravity, Urine: >1.03 — ABNORMAL HIGH (ref 1.005–1.030)
pH: 6 (ref 5.0–8.0)

## 2020-07-23 LAB — LIPASE, BLOOD: Lipase: 23 U/L (ref 11–51)

## 2020-07-23 LAB — CBC
HCT: 32.6 % — ABNORMAL LOW (ref 36.0–46.0)
Hemoglobin: 10.3 g/dL — ABNORMAL LOW (ref 12.0–15.0)
MCH: 24.9 pg — ABNORMAL LOW (ref 26.0–34.0)
MCHC: 31.6 g/dL (ref 30.0–36.0)
MCV: 78.9 fL — ABNORMAL LOW (ref 80.0–100.0)
Platelets: 309 10*3/uL (ref 150–400)
RBC: 4.13 MIL/uL (ref 3.87–5.11)
RDW: 14.8 % (ref 11.5–15.5)
WBC: 12.2 10*3/uL — ABNORMAL HIGH (ref 4.0–10.5)
nRBC: 0 % (ref 0.0–0.2)

## 2020-07-23 LAB — COMPREHENSIVE METABOLIC PANEL
ALT: 15 U/L (ref 0–44)
AST: 23 U/L (ref 15–41)
Albumin: 4.5 g/dL (ref 3.5–5.0)
Alkaline Phosphatase: 62 U/L (ref 38–126)
Anion gap: 11 (ref 5–15)
BUN: 12 mg/dL (ref 6–20)
CO2: 22 mmol/L (ref 22–32)
Calcium: 9.6 mg/dL (ref 8.9–10.3)
Chloride: 105 mmol/L (ref 98–111)
Creatinine, Ser: 0.66 mg/dL (ref 0.44–1.00)
GFR, Estimated: >60 mL/min (ref >60)
Glucose, Bld: 137 mg/dL — ABNORMAL HIGH (ref 70–99)
Potassium: 2.9 mmol/L — ABNORMAL LOW (ref 3.5–5.1)
Sodium: 138 mmol/L (ref 135–145)
Total Bilirubin: 0.7 mg/dL (ref 0.3–1.2)
Total Protein: 7.6 g/dL (ref 6.5–8.1)

## 2020-07-23 MED ORDER — IOHEXOL 300 MG/ML  SOLN
100.0000 mL | Freq: Once | INTRAMUSCULAR | Status: AC | PRN
  Administered 2020-07-23: 100 mL via INTRAVENOUS

## 2020-07-23 MED ORDER — MORPHINE SULFATE (PF) 4 MG/ML IV SOLN
4.0000 mg | Freq: Once | INTRAVENOUS | Status: AC
  Administered 2020-07-23: 4 mg via INTRAVENOUS
  Filled 2020-07-23: qty 1

## 2020-07-23 MED ORDER — ONDANSETRON 4 MG PO TBDP: 4.0000 mg | ORAL_TABLET | Freq: Once | ORAL | Status: DC | PRN

## 2020-07-23 MED ORDER — DOCUSATE SODIUM 100 MG PO CAPS: 100.0000 mg | ORAL_CAPSULE | Freq: Every day | ORAL | 0 refills | Status: DC

## 2020-07-23 MED ORDER — ONDANSETRON HCL 4 MG/2ML IJ SOLN
4.0000 mg | Freq: Once | INTRAMUSCULAR | Status: AC
  Administered 2020-07-23: 4 mg via INTRAVENOUS
  Filled 2020-07-23: qty 2

## 2020-07-23 MED ORDER — POLYETHYLENE GLYCOL 3350 17 GM/SCOOP PO POWD: ORAL | 0 refills | Status: DC

## 2020-07-23 MED ORDER — DICYCLOMINE HCL 10 MG PO CAPS
10.0000 mg | ORAL_CAPSULE | Freq: Once | ORAL | Status: AC
  Administered 2020-07-23: 10 mg via ORAL
  Filled 2020-07-23: qty 1

## 2020-07-23 NOTE — ED Notes (Signed)
Requested urine from patient and gave patient urine cup.

## 2020-07-23 NOTE — ED Provider Notes (Signed)
[facility]EMERGENCY [facility]PT Provider Note   CSN: [id number] Arrival date & time: 07/23/20  0014     History Chief Complaint  Patient presents with  . Abdominal Pain    Valerie Newman is a 20 y.o. female presenting for evaluation of nausea, bloating, abdominal pain.  Patient states she has been having gradually worsening abdominal pain since this afternoon.  It began after eating lunch.  She has associated nausea and vomiting, has vomited x4.  No blood in her emesis.  She states she has had similar pain before, that was relieved after one bowel movement.  She did have a very small, partial bowel movement this afternoon, no improvement of pain.  Before that, it had been several days since she had a bowel movement.  Pain is constant, described as a spasm.  Nothing makes it better or worse.  She denies fevers, chills, cough, chest pain, urinary symptoms.  She denies tobacco, alcohol, or drug use.  She states she is taking her stomach medication daily (?gerd meds).  She does not take any other medications regularly.  Additional history obtained from chart review.  Per chart review, patient has been seen recently for vaginal symptoms, tested positive for BV.  Gonorrhea and Chlamydia were negative.  HPI     Past Medical History:  Diagnosis Date  . 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 Allergy to pollen   . Anemia   . Anxiety   . Asthma     Patient Active Problem List   Diagnosis Date Noted  . Nexplanon insertion   . Chlamydia infection 10/15/2017    Past Surgical History:  Procedure Laterality Date  . TOOTH EXTRACTION       OB History    Gravida  1   Para  1   Term  1   Preterm  0   AB  0   Living  1     SAB  0   IAB  0   Ectopic  0   Multiple  0   Live Births  1           Family History  Problem Relation Age of Onset  . Heart disease Mother   . Stroke Mother   . Diabetes Maternal Grandmother     Social History   Tobacco  Use  . Smoking status: Never Smoker  . Smokeless tobacco: Never Used  Vaping Use  . Vaping Use: Never used  Substance Use Topics  . Alcohol use: No  . Drug use: No    Home Medications Prior to Admission medications   Medication Sig Start Date End Date Taking? Authorizing Provider  ferrous sulfate 325 (65 FE) MG tablet Take 1 tablet (325 mg total) by mouth daily with breakfast. 07/08/20  Yes [name], NP  omeprazole (PRILOSEC) 20 MG capsule Take 1 capsule (20 mg total) by mouth daily. 07/10/20  Yes Covington, Maralyn Sago M, PA-C  ondansetron (ZOFRAN) 4 MG tablet Take 1 tablet (4 mg total) by mouth every 6 (six) hours. 07/10/20  Yes Rushie Chestnut, PA-C  etonogestrel-ethinyl estradiol (NUVARING) 0.12-0.015 MG/24HR vaginal ring Insert vaginally and leave in place for 3 consecutive weeks, then remove for 1 week. Patient not taking: Reported on 07/23/2020 07/03/20   [name], NP  fexofenadine-pseudoephedrine (ALLEGRA-D) 60-120 MG 12 hr tablet Take 1 tablet by mouth 2 (two) times daily. Patient not taking: Reported on 07/23/2020 07/03/20   [name], NP  metroNIDAZOLE (METROGEL) 0.75 % vaginal gel Place 1 Applicatorful vaginally 2 (two) times daily. Patient not taking: Reported on 07/23/2020 07/10/20   Rushie Chestnut, PA-C    Allergies    Bee venom and Wasp venom  Review of Systems   Review of Systems  Gastrointestinal: Positive for abdominal pain, nausea and vomiting.  All other systems reviewed and are negative.   Physical Exam Updated Vital Signs BP 118/90   Pulse 91   Temp (!) 97.4 F (36.3 C) (Oral)   Resp 19   Ht 5\' 3"  (1.6 m)   Wt 60.3 kg   LMP 07/15/2020   SpO2 100%   BMI 23.56 kg/m   Physical Exam Vitals and nursing note reviewed.  Constitutional:      General: She is not in acute distress.    Appearance: She is well-developed and well-nourished.     Comments: Appears nontoxic  HENT:     Head: Normocephalic and atraumatic.  Eyes:      Extraocular Movements: EOM normal.     Conjunctiva/sclera: Conjunctivae normal.     Pupils: Pupils are equal, round, and reactive to light.  Cardiovascular:     Rate and Rhythm: Normal rate and regular rhythm.     Pulses: Normal pulses and intact distal pulses.  Pulmonary:     Effort: Pulmonary effort is normal. No respiratory distress.     Breath sounds: Normal breath sounds. No wheezing.  Abdominal:     General: There is no distension.     Palpations: Abdomen is soft. There is no mass.     Tenderness: There is abdominal tenderness. There is no guarding or rebound.     Comments: Diffuse ttp of the abd. No flank pain. No rigidity or distention  Musculoskeletal:        General: Normal range of motion.     Cervical back: Normal range of motion and neck supple.  Skin:    General: Skin is warm and dry.     Capillary Refill: Capillary refill takes less than 2 seconds.  Neurological:     Mental Status: She is alert and oriented to person, place, and time.  Psychiatric:        Mood and Affect: Mood and affect normal.     ED Results / Procedures / Treatments   Labs (all labs ordered are listed, but only abnormal results are displayed) Labs Reviewed  COMPREHENSIVE METABOLIC PANEL - Abnormal; Notable for the following components:      Result Value   Potassium 2.9 (*)    Glucose, Bld 137 (*)    All other components within normal limits  CBC - Abnormal; Notable for the following components:   WBC 12.2 (*)    Hemoglobin 10.3 (*)    HCT 32.6 (*)    MCV 78.9 (*)    MCH 24.9 (*)    All other components within normal limits  LIPASE, BLOOD  URINALYSIS, ROUTINE W REFLEX MICROSCOPIC  I-STAT BETA HCG BLOOD, ED ([facility], [facility], [facility] ONLY)    EKG None  Radiology No results found.  Procedures Procedures   Medications Ordered in ED Medications  ondansetron (ZOFRAN) injection 4 mg (4 mg Intravenous Given 07/23/20 0135)  dicyclomine (BENTYL) capsule 10 mg (10 mg Oral Given 07/23/20 0137)   morphine 4 MG/ML injection 4 mg (4 mg Intravenous Given 07/23/20 0252)    ED Course  I have reviewed the triage vital signs and the nursing notes.  Pertinent labs & imaging results that were  available during my care of the patient were reviewed by me and considered in my medical decision making (see chart for details).    MDM Rules/Calculators/A&P                          Patient presented for evaluation nausea, vomiting, Donnell pain.  On exam, patient appears nontoxic.  She does have diffuse tenderness palpation of the abdomen, no focal pain.  Without fevers, considering symptoms began today, lower suspicion for intra-abdominal infection.  Consider viral GI illness.  Consider constipation.  Will treat Zofran and give Bentyl for abdominal spasms and reassess.  Obtain abdominal labs.  Labs interpreted by me, shows mild acidosis.  Otherwise reassuring.  Kidney, liver, pancreatic function normal.  Potassium was slightly low at 2.9, consistent with vomiting.  On reevaluation, patient reports nausea improved, but pain is still severe.  She continues to have diffuse tenderness palpation.  As such, will obtain CT abdomen pelvis for further evaluation.  CT abdomen pelvis consistent with constipation.  No other acute findings including infection, obstruction, kidney stone.  Discussed findings with patient.  Discussed treatment for constipation.  On reevaluation after pain control, symptoms are improved.  I discussed that narcotic pain medication is not a good option for her considering her constipation.  I encourage dietary changes including increased hydration and a high-fiber diet.  Follow-up with PCP.  At this time, patient appears safe for discharge.  Return precautions given.  Patient states she understands and agrees to plan.  Final Clinical Impression(s) / ED Diagnoses Final diagnoses:  Constipation, unspecified constipation type  Generalized abdominal pain    Rx / DC Orders ED Discharge  Orders    None       Alveria Apley, PA-C 07/23/20 0544    Gilda Crease, MD 07/23/20 [phone]

## 2020-07-23 NOTE — Telephone Encounter (Signed)
Please advise 

## 2020-07-23 NOTE — Discharge Instructions (Signed)
Take MiraLAX until you are having regular bowel movements.  This will help you go. Use Colace as needed for forward or painful stool. Make sure stay well-hydrated water. Eat a high-fiber diet. Follow-up with your primary care doctor for recheck of your symptoms and further management of your constipation. Use Tylenol or ibuprofen as needed for pain. Return to the emergency room if you develop fevers, persistent vomiting, severe worsening pain, or any new, worsening, or concerning symptoms.

## 2020-07-23 NOTE — ED Notes (Signed)
Pt again asked for a urine sample and said she could not give one at this time.

## 2020-07-23 NOTE — ED Notes (Signed)
Patient to CT.

## 2020-07-23 NOTE — ED Triage Notes (Signed)
Patient is brought in by [facility] from a friends house. Patient is complaining of right upper quadrant pain today. Patient is not taking Gerd medication.

## 2020-07-24 ENCOUNTER — Telehealth: Payer: Self-pay | Admitting: *Deleted

## 2020-07-24 NOTE — Telephone Encounter (Signed)
Call patient increase water and fibers get OTC senokotS bid if no BM give enema.

## 2020-07-24 NOTE — Telephone Encounter (Signed)
Transition Care Management Unsuccessful Follow-up Telephone Call  Date of discharge and from where:  07/23/2020 - [facility]  Attempts:  1st Attempt  Reason for unsuccessful TCM follow-up call:  Left voice message 

## 2020-07-25 NOTE — Telephone Encounter (Signed)
Transition Care Management Follow-up Telephone Call  Date of discharge and from where: 07/23/2020  How have you been since you were released from the hospital? "I am feeling better"  Any questions or concerns? No  Items Reviewed:  Did the pt receive and understand the discharge instructions provided? Yes   Medications obtained and verified? Yes   Other? No   Any new allergies since your discharge? No   Dietary orders reviewed? Yes  Do you have support at home? Yes   Home Care and Equipment/Supplies: Were home health services ordered? not applicable If so, what is the name of the agency? N/A  Has the agency set up a time to come to the patient's home? not applicable Were any new equipment or medical supplies ordered?  No What is the name of the medical supply agency? N/A Were you able to get the supplies/equipment? not applicable Do you have any questions related to the use of the equipment or supplies? No  Functional Questionnaire: (I = Independent and D = Dependent) ADLs: I  Bathing/Dressing- I  Meal Prep- I  Eating- I  Maintaining continence- I  Transferring/Ambulation- I  Managing Meds- I  Follow up appointments reviewed:   PCP Hospital f/u appt confirmed? No  - Patient will call to make appointment if necessary.   Specialist Hospital f/u appt confirmed? N/A   Are transportation arrangements needed? N/A  If their condition worsens, is the pt aware to call PCP or go to the Emergency Dept.? Yes  Was the patient provided with contact information for the PCP's office or ED? Yes  Was to pt encouraged to call back with questions or concerns? Yes

## 2020-07-25 NOTE — Telephone Encounter (Signed)
Transition Care Management Unsuccessful Follow-up Telephone Call  Date of discharge and from where:  07/23/2020 - [facility]  Attempts:  2nd Attempt  Reason for unsuccessful TCM follow-up call:  Left voice message 

## 2020-08-07 ENCOUNTER — Encounter (HOSPITAL_COMMUNITY): Payer: Self-pay | Admitting: Emergency Medicine

## 2020-08-07 ENCOUNTER — Ambulatory Visit (HOSPITAL_COMMUNITY)
Admission: EM | Admit: 2020-08-07 | Discharge: 2020-08-07 | Disposition: A | Discharge status: Home / Self Care | Payer: Medicaid Other | Attending: Urgent Care | Admitting: Urgent Care

## 2020-08-07 ENCOUNTER — Other Ambulatory Visit: Payer: Self-pay

## 2020-08-07 DIAGNOSIS — J069 Acute upper respiratory infection, unspecified: Secondary | ICD-10-CM

## 2020-08-07 DIAGNOSIS — J4530 Mild persistent asthma, uncomplicated: Secondary | ICD-10-CM

## 2020-08-07 MED ORDER — CETIRIZINE HCL 10 MG PO TABS: 10.0000 mg | ORAL_TABLET | Freq: Every day | ORAL | 0 refills | Status: DC

## 2020-08-07 MED ORDER — PROMETHAZINE-DM 6.25-15 MG/5ML PO SYRP: 5.0000 mL | ORAL_SOLUTION | Freq: Every evening | ORAL | 0 refills | Status: DC | PRN

## 2020-08-07 MED ORDER — ALBUTEROL SULFATE HFA 108 (90 BASE) MCG/ACT IN AERS: 1.0000 | INHALATION_SPRAY | Freq: Four times a day (QID) | RESPIRATORY_TRACT | 0 refills | Status: DC | PRN

## 2020-08-07 MED ORDER — PSEUDOEPHEDRINE HCL 60 MG PO TABS: 60.0000 mg | ORAL_TABLET | Freq: Three times a day (TID) | ORAL | 0 refills | Status: DC | PRN

## 2020-08-07 MED ORDER — BENZONATATE 100 MG PO CAPS: 100.0000 mg | ORAL_CAPSULE | Freq: Three times a day (TID) | ORAL | 0 refills | Status: DC | PRN

## 2020-08-07 NOTE — ED Triage Notes (Signed)
Pt presents with body aches, sore throat and headaches xs 2 days.

## 2020-08-07 NOTE — ED Provider Notes (Signed)
[facility]   MRN: [id number] DOB: 2000-08-18  Subjective:   Valerie Newman is a 20 y.o. female presenting for 2-day history of acute onset fever, malaise, fatigue, throat pain, body aches, intermittent shortness of breath.  Patient states that she has been getting winded when she walks upstairs.  She does have a history of asthma and has been using her albuterol inhaler but needs a refill.  Denies smoking cigarettes.  Denies chest pain.  She is COVID vaccinated.  No current facility-administered medications for this encounter.  Current Outpatient Medications:  .  docusate sodium (COLACE) 100 MG capsule, Take 1 capsule (100 mg total) by mouth daily., Disp: 20 capsule, Rfl: 0 .  etonogestrel-ethinyl estradiol (NUVARING) 0.12-0.015 MG/24HR vaginal ring, Insert vaginally and leave in place for 3 consecutive weeks, then remove for 1 week. (Patient not taking: Reported on 07/23/2020), Disp: 1 each, Rfl: 12 .  ferrous sulfate 325 (65 FE) MG tablet, Take 1 tablet (325 mg total) by mouth daily with breakfast., Disp: 90 tablet, Rfl: 3 .  fexofenadine-pseudoephedrine (ALLEGRA-D) 60-120 MG 12 hr tablet, Take 1 tablet by mouth 2 (two) times daily. (Patient not taking: Reported on 07/23/2020), Disp: 60 tablet, Rfl: 1 .  metroNIDAZOLE (METROGEL) 0.75 % vaginal gel, Place 1 Applicatorful vaginally 2 (two) times daily. (Patient not taking: Reported on 07/23/2020), Disp: 70 g, Rfl: 0 .  omeprazole (PRILOSEC) 20 MG capsule, Take 1 capsule (20 mg total) by mouth daily., Disp: 14 capsule, Rfl: 0 .  ondansetron (ZOFRAN) 4 MG tablet, Take 1 tablet (4 mg total) by mouth every 6 (six) hours., Disp: 12 tablet, Rfl: 0 .  polyethylene glycol powder (MIRALAX) 17 GM/SCOOP powder, Use 1-2 capfuls in 8 oz of liquid up to 3 times a day until you are having regular bowel movements, Disp: 255 g, Rfl: 0   Allergies  Allergen Reactions  . Bee Venom Swelling  . Wasp Venom Swelling    Past Medical History:   Diagnosis Date  . 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 Allergy to pollen   . Anemia   . Anxiety   . Asthma      Past Surgical History:  Procedure Laterality Date  . TOOTH EXTRACTION      Family History  Problem Relation Age of Onset  . Heart disease Mother   . Stroke Mother   . Diabetes Maternal Grandmother     Social History   Tobacco Use  . Smoking status: Never Smoker  . Smokeless tobacco: Never Used  Vaping Use  . Vaping Use: Never used  Substance Use Topics  . Alcohol use: No  . Drug use: No    ROS   Objective:   Vitals: BP 104/69 (BP Location: Right Arm)   Pulse 80   Temp 98.5 F (36.9 C) (Oral)   Resp 17   LMP 07/15/2020 Comment: negative beta HCG 07/23/20  SpO2 99%   Physical Exam Constitutional:      General: She is not in acute distress.    Appearance: Normal appearance. She is well-developed. She is not ill-appearing, toxic-appearing or diaphoretic.  HENT:     Head: Normocephalic and atraumatic.     Nose: Nose normal.     Mouth/Throat:     Mouth: Mucous membranes are moist.     Pharynx: Oropharynx is clear. No pharyngeal swelling, oropharyngeal exudate, posterior oropharyngeal erythema or uvula swelling.  Eyes:     Extraocular Movements: Extraocular movements intact.  Pupils: Pupils are equal, round, and reactive to light.  Cardiovascular:     Rate and Rhythm: Normal rate and regular rhythm.     Pulses: Normal pulses.     Heart sounds: Normal heart sounds. No murmur heard. No friction rub. No gallop.   Pulmonary:     Effort: Pulmonary effort is normal. No respiratory distress.     Breath sounds: Normal breath sounds. No stridor. No wheezing, rhonchi or rales.  Skin:    General: Skin is warm and dry.     Findings: No rash.  Neurological:     Mental Status: She is alert and oriented to person, place, and time.  Psychiatric:        Mood and Affect: Mood normal.        Behavior: Behavior normal.        Thought Content: Thought  content normal.    Assessment and Plan :   PDMP not reviewed this encounter.  1. Viral upper respiratory tract infection   2. Mild persistent asthma without complication     Will manage for viral illness such as viral URI, viral syndrome, viral rhinitis, COVID-19. Counseled patient on nature of COVID-19 including modes of transmission, diagnostic testing, management and supportive care.  Offered scripts for symptomatic relief, refilled her albuterol inhaler. COVID 19 testing is pending. Counseled patient on potential for adverse effects with medications prescribed/recommended today, ER and return-to-clinic precautions discussed, patient verbalized understanding.     Wallis Bamberg, [place] 08/07/20 [phone]

## 2020-08-12 ENCOUNTER — Telehealth (HOSPITAL_COMMUNITY): Payer: Self-pay | Admitting: Emergency Medicine

## 2020-08-12 NOTE — Telephone Encounter (Signed)
Patient called looking for COVID results from recent visit.  This RN does not note a test or results in the computer.  Informed patient of need to recollect, patient will need work note covering her for the absence on our account.

## 2020-09-05 ENCOUNTER — Ambulatory Visit (HOSPITAL_COMMUNITY)
Admission: EM | Admit: 2020-09-05 | Discharge: 2020-09-05 | Disposition: A | Discharge status: Home / Self Care | Payer: Medicaid Other | Attending: Emergency Medicine | Admitting: Emergency Medicine

## 2020-09-05 ENCOUNTER — Other Ambulatory Visit: Payer: Self-pay

## 2020-09-05 ENCOUNTER — Encounter (HOSPITAL_COMMUNITY): Payer: Self-pay

## 2020-09-05 DIAGNOSIS — Z113 Encounter for screening for infections with a predominantly sexual mode of transmission: Secondary | ICD-10-CM | POA: Insufficient documentation

## 2020-09-05 DIAGNOSIS — H5789 Other specified disorders of eye and adnexa: Secondary | ICD-10-CM | POA: Diagnosis not present

## 2020-09-05 DIAGNOSIS — J3089 Other allergic rhinitis: Secondary | ICD-10-CM | POA: Diagnosis not present

## 2020-09-05 MED ORDER — FLUTICASONE PROPIONATE 50 MCG/ACT NA SUSP: 2.0000 | Freq: Every day | NASAL | 0 refills | Status: DC

## 2020-09-05 MED ORDER — CETIRIZINE-PSEUDOEPHEDRINE ER 5-120 MG PO TB12: 1.0000 | ORAL_TABLET | Freq: Every day | ORAL | 0 refills | Status: DC

## 2020-09-05 NOTE — ED Triage Notes (Signed)
Pt complains of chronic itchy & watery eyes and nose and slight non productive cough.

## 2020-09-05 NOTE — Discharge Instructions (Addendum)
Switch allergy medications (if you were using Claritin, switch to Zyrtec).   Use Flonase 2 sprays in each nostril daily.   You can also use allergy eye drops.   We will contact you with the results from your lab work and any additional treatment.   Do not have sex while taking undergoing treatment for STI.  Make sure that all of your partners get tested and treated.  Use a condom or other barrier method for all sexual encounters.    Return or go to the Emergency Department if symptoms worsen or do not improve in the next few days.   Drink lots of fluids and rest.   Return or go to the Emergency Department if symptoms worsen or do not improve in the next few days.

## 2020-09-05 NOTE — ED Provider Notes (Signed)
[facility]    CSN: [id number] Arrival date & time: 09/05/20  0844      History   Chief Complaint Chief Complaint  Patient presents with  . Allergies    HPI Valerie Newman is a 20 y.o. female.   Valerie Newman is a 20 y.o. with chief complaint of nasal congestion and itchy/watery eyes.  Reports similar symptoms with seasonal allergies.  Reports taking Claritin in the past with relief.  Denies any specific alleviating or aggravating factors.  Denies any fevers, chest pain, shortness of breath, N/V/D, abdominal pain, or headaches.   Also requesting STD screening.  ROS: As per HPI, all other pertinent ROS negative   The history is provided by the patient.    Past Medical History:  Diagnosis Date  . 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 Allergy to pollen   . Anemia   . Anxiety   . Asthma     Patient Active Problem List   Diagnosis Date Noted  . Nexplanon insertion   . Chlamydia infection 10/15/2017    Past Surgical History:  Procedure Laterality Date  . TOOTH EXTRACTION      OB History    Gravida  1   Para  1   Term  1   Preterm  0   AB  0   Living  1     SAB  0   IAB  0   Ectopic  0   Multiple  0   Live Births  1            Home Medications    Prior to Admission medications   Medication Sig Start Date End Date Taking? Authorizing Provider  cetirizine-pseudoephedrine (ZYRTEC-D) 5-120 MG tablet Take 1 tablet by mouth daily. 09/05/20  Yes Ivette Loyal, NP  fluticasone (FLONASE) 50 MCG/ACT nasal spray Place 2 sprays into both nostrils daily. 09/05/20 10/05/20 Yes Ivette Loyal, NP  albuterol (VENTOLIN HFA) 108 (90 Base) MCG/ACT inhaler Inhale 1-2 puffs into the lungs every 6 (six) hours as needed for wheezing or shortness of breath. 08/07/20   Wallis Bamberg, PA-C  benzonatate (TESSALON) 100 MG capsule Take 1-2 capsules (100-200 mg total) by mouth 3 (three) times daily as needed for cough. 08/07/20   Wallis Bamberg, PA-C  cetirizine  (ZYRTEC ALLERGY) 10 MG tablet Take 1 tablet (10 mg total) by mouth daily. 08/07/20   Wallis Bamberg, PA-C  docusate sodium (COLACE) 100 MG capsule Take 1 capsule (100 mg total) by mouth daily. 07/23/20   Caccavale, Sophia, PA-C  etonogestrel-ethinyl estradiol (NUVARING) 0.12-0.015 MG/24HR vaginal ring Insert vaginally and leave in place for 3 consecutive weeks, then remove for 1 week. Patient not taking: Reported on 07/23/2020 07/03/20   [name], NP  ferrous sulfate 325 (65 FE) MG tablet Take 1 tablet (325 mg total) by mouth daily with breakfast. 07/08/20   [name], NP  fexofenadine-pseudoephedrine (ALLEGRA-D) 60-120 MG 12 hr tablet Take 1 tablet by mouth 2 (two) times daily. Patient not taking: Reported on 07/23/2020 07/03/20   [name], NP  metroNIDAZOLE (METROGEL) 0.75 % vaginal gel Place 1 Applicatorful vaginally 2 (two) times daily. Patient not taking: Reported on 07/23/2020 07/10/20   Rushie Chestnut, PA-C  omeprazole (PRILOSEC) 20 MG capsule Take 1 capsule (20 mg total) by mouth daily. 07/10/20   Rushie Chestnut, PA-C  ondansetron (ZOFRAN) 4 MG tablet Take 1 tablet (4 mg total) by mouth every 6 (  six) hours. 07/10/20   Rushie Chestnut, PA-C  polyethylene glycol powder [facility]) 17 GM/SCOOP powder Use 1-2 capfuls in 8 oz of liquid up to 3 times a day until you are having regular bowel movements 07/23/20   Caccavale, Sophia, PA-C  promethazine-dextromethorphan (PROMETHAZINE-DM) 6.25-15 MG/5ML syrup Take 5 mLs by mouth at bedtime as needed for cough. 08/07/20   Wallis Bamberg, PA-C  pseudoephedrine (SUDAFED) 60 MG tablet Take 1 tablet (60 mg total) by mouth every 8 (eight) hours as needed for congestion. 08/07/20   Wallis Bamberg, PA-C    Family History Family History  Problem Relation Age of Onset  . Heart disease Mother   . Stroke Mother   . Diabetes Maternal Grandmother     Social History Social History   Tobacco Use  . Smoking status: Never Smoker  . Smokeless  tobacco: Never Used  Vaping Use  . Vaping Use: Never used  Substance Use Topics  . Alcohol use: No  . Drug use: No     Allergies   Bee venom and Wasp venom   Review of Systems Review of Systems  Constitutional: Negative for fatigue and fever.  HENT: Positive for rhinorrhea.   Eyes: Positive for itching.     Physical Exam Triage Vital Signs ED Triage Vitals [09/05/20 0932]  Enc Vitals Group     BP 107/75     Pulse Rate 78     Resp 18     Temp 98.3 F (36.8 C)     Temp Source Oral     SpO2 94 %     Weight      Height      Head Circumference      Peak Flow      Pain Score 0     Pain Loc      Pain Edu?      Excl. in GC?    No data found.  Updated Vital Signs BP 107/75 (BP Location: Left Arm)   Pulse 78   Temp 98.3 F (36.8 C) (Oral)   Resp 18   LMP 08/16/2020   SpO2 94%   Visual Acuity Right Eye Distance:   Left Eye Distance:   Bilateral Distance:    Right Eye Near:   Left Eye Near:    Bilateral Near:     Physical Exam Vitals and nursing note reviewed.  Constitutional:      General: She is not in acute distress.    Appearance: Normal appearance. She is not ill-appearing, toxic-appearing or diaphoretic.  HENT:     Head: Normocephalic and atraumatic.     Nose: Nose normal.     Mouth/Throat:     Mouth: Mucous membranes are moist.  Eyes:     Conjunctiva/sclera: Conjunctivae normal.  Cardiovascular:     Rate and Rhythm: Normal rate.     Pulses: Normal pulses.     Heart sounds: Normal heart sounds.  Pulmonary:     Effort: Pulmonary effort is normal.     Breath sounds: Normal breath sounds.  Abdominal:     General: Abdomen is flat.  Musculoskeletal:        General: Normal range of motion.     Cervical back: Normal range of motion.  Skin:    General: Skin is warm and dry.  Neurological:     General: No focal deficit present.     Mental Status: She is alert and oriented to person, place, and time.  Psychiatric:  Mood and Affect: Mood  normal.      UC Treatments / Results  Labs (all labs ordered are listed, but only abnormal results are displayed) Labs Reviewed  CERVICOVAGINAL ANCILLARY ONLY    EKG   Radiology No results found.  Procedures Procedures (including critical care time)  Medications Ordered in UC Medications - No data to display  Initial Impression / Assessment and Plan / UC Course  I have reviewed the triage vital signs and the nursing notes.  Pertinent labs & imaging results that were available during my care of the patient were reviewed by me and considered in my medical decision making (see chart for details).     Seasonal Allergies Eye irritation Assessment negative for red flags or concerns.   Has used Claritin but reports that it is no longer working.  Recommend switching to Zyrtec daily.   Use Flonase nasal spray daily.  You can also use allergy eye drops.  Encourage rest and fluids.  Follow up with PCP  Screen for STD Self swab obtained and will treat based on results.   Denies any symptoms.   Follow up as needed.  Final Clinical Impressions(s) / UC Diagnoses   Final diagnoses:  Seasonal allergic rhinitis due to other allergic trigger  Screen for STD (sexually transmitted disease)  Eye irritation     Discharge Instructions     Switch allergy medications (if you were using Claritin, switch to Zyrtec).   Use Flonase 2 sprays in each nostril daily.   You can also use allergy eye drops.   We will contact you with the results from your lab work and any additional treatment.   Do not have sex while taking undergoing treatment for STI.  Make sure that all of your partners get tested and treated.  Use a condom or other barrier method for all sexual encounters.    Return or go to the Emergency Department if symptoms worsen or do not improve in the next few days.   Drink lots of fluids and rest.   Return or go to the Emergency Department if symptoms worsen or do not  improve in the next few days.        ED Prescriptions    Medication Sig Dispense Auth. Provider   cetirizine-pseudoephedrine (ZYRTEC-D) 5-120 MG tablet Take 1 tablet by mouth daily. 30 tablet Ivette Loyal, NP   fluticasone (FLONASE) 50 MCG/ACT nasal spray Place 2 sprays into both nostrils daily. 18.2 mL Ivette Loyal, NP     PDMP not reviewed this encounter.   Ivette Loyal, NP 09/05/20 1013

## 2020-09-06 ENCOUNTER — Telehealth (HOSPITAL_COMMUNITY): Payer: Self-pay | Admitting: Emergency Medicine

## 2020-09-06 LAB — CERVICOVAGINAL ANCILLARY ONLY
Bacterial Vaginitis (gardnerella): NEGATIVE
Candida Glabrata: POSITIVE — AB
Candida Vaginitis: POSITIVE — AB
Chlamydia: NEGATIVE
Comment: NEGATIVE
Comment: NEGATIVE
Comment: NEGATIVE
Comment: NEGATIVE
Comment: NEGATIVE
Comment: NORMAL
Neisseria Gonorrhea: NEGATIVE
Trichomonas: NEGATIVE

## 2020-09-06 MED ORDER — FLUCONAZOLE 150 MG PO TABS: 150.0000 mg | ORAL_TABLET | Freq: Once | ORAL | 0 refills | Status: AC

## 2020-10-01 ENCOUNTER — Ambulatory Visit (INDEPENDENT_AMBULATORY_CARE_PROVIDER_SITE_OTHER): Payer: Medicaid Other | Admitting: Primary Care

## 2020-10-15 ENCOUNTER — Encounter (HOSPITAL_COMMUNITY): Payer: Self-pay | Admitting: Emergency Medicine

## 2020-10-15 ENCOUNTER — Ambulatory Visit (HOSPITAL_COMMUNITY)
Admission: EM | Admit: 2020-10-15 | Discharge: 2020-10-15 | Disposition: A | Discharge status: Home / Self Care | Payer: Medicaid Other | Attending: Internal Medicine | Admitting: Internal Medicine

## 2020-10-15 ENCOUNTER — Other Ambulatory Visit: Payer: Self-pay

## 2020-10-15 DIAGNOSIS — J45909 Unspecified asthma, uncomplicated: Secondary | ICD-10-CM | POA: Diagnosis present

## 2020-10-15 DIAGNOSIS — B3731 Acute candidiasis of vulva and vagina: Secondary | ICD-10-CM

## 2020-10-15 DIAGNOSIS — B373 Candidiasis of vulva and vagina: Secondary | ICD-10-CM

## 2020-10-15 LAB — POCT URINALYSIS DIPSTICK, ED / UC
Bilirubin Urine: NEGATIVE
Glucose, UA: NEGATIVE mg/dL
Ketones, ur: NEGATIVE mg/dL
Leukocytes,Ua: NEGATIVE
Nitrite: NEGATIVE
Protein, ur: NEGATIVE mg/dL
Specific Gravity, Urine: 1.025 (ref 1.005–1.030)
Urobilinogen, UA: 0.2 mg/dL (ref 0.0–1.0)
pH: 7 (ref 5.0–8.0)

## 2020-10-15 MED ORDER — ALBUTEROL SULFATE HFA 108 (90 BASE) MCG/ACT IN AERS: 1.0000 | INHALATION_SPRAY | Freq: Four times a day (QID) | RESPIRATORY_TRACT | 0 refills | Status: DC | PRN

## 2020-10-15 MED ORDER — CETIRIZINE HCL 10 MG PO TABS: 10.0000 mg | ORAL_TABLET | Freq: Every day | ORAL | 1 refills | Status: DC

## 2020-10-15 MED ORDER — FLUCONAZOLE 150 MG PO TABS: 150.0000 mg | ORAL_TABLET | Freq: Every day | ORAL | 0 refills | Status: AC

## 2020-10-15 NOTE — ED Triage Notes (Signed)
reports vaginal discharge, no odor.  Patient recently used a different soap.  Patient reports urinary urgency.  Patient has upper back pain.    History of asthma.  Patient has noticed increased sob or breathing heavy.  Patient does not have inhaler

## 2020-10-15 NOTE — ED Provider Notes (Addendum)
[facility]    CSN: [id number] Arrival date & time: 10/15/20  0813      History   Chief Complaint Chief Complaint  Patient presents with  . SEXUALLY TRANSMITTED DISEASE    HPI Valerie Newman is a 20 y.o. female.   Patient presents with vaginal urinary irritation and small amount of Milam Allbaugh thick vaginal discharge with no odor after changing of symptoms for 3 days.  Endorses urinary frequency,urgency and Bilateral lower back pain.  Denies itching, new lesion, abdominal pain.  Has 1 new female partner, no condom use.  Presents with shortness of breath with mild exertion for 2 weeks.  States she could be getting dressed and will be breathing heavily to the point that other people notice. Has been sneezing and having intermittent generalized headaches.  Denies wheezing, chest pain, congestion, rhinorrhea, fevers, chills.  History of asthma, ran out of albuterol medication.  Denies history of allergies.   Past Medical History:  Diagnosis Date  . 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 Allergy to pollen   . Anemia   . Anxiety   . Asthma     Patient Active Problem List   Diagnosis Date Noted  . Nexplanon insertion   . Chlamydia infection 10/15/2017    Past Surgical History:  Procedure Laterality Date  . TOOTH EXTRACTION      OB History    Gravida  1   Para  1   Term  1   Preterm  0   AB  0   Living  1     SAB  0   IAB  0   Ectopic  0   Multiple  0   Live Births  1            Home Medications    Prior to Admission medications   Medication Sig Start Date End Date Taking? Authorizing Provider  fluconazole (DIFLUCAN) 150 MG tablet Take 1 tablet (150 mg total) by mouth daily for 2 doses. 10/15/20 10/17/20 Yes Breanne Olvera R, NP  albuterol (VENTOLIN HFA) 108 (90 Base) MCG/ACT inhaler Inhale 1-2 puffs into the lungs every 6 (six) hours as needed for wheezing or shortness of breath. 10/15/20   Dayle Mcnerney, Elita Boone, NP  benzonatate (TESSALON) 100 MG  capsule Take 1-2 capsules (100-200 mg total) by mouth 3 (three) times daily as needed for cough. Patient not taking: Reported on 10/15/2020 08/07/20   Wallis Bamberg, PA-C  cetirizine (ZYRTEC ALLERGY) 10 MG tablet Take 1 tablet (10 mg total) by mouth daily. 10/15/20   Phuong Hillary, Elita Boone, NP  cetirizine-pseudoephedrine (ZYRTEC-D) 5-120 MG tablet Take 1 tablet by mouth daily. Patient not taking: Reported on 10/15/2020 09/05/20   Ivette Loyal, NP  docusate sodium (COLACE) 100 MG capsule Take 1 capsule (100 mg total) by mouth daily. Patient not taking: Reported on 10/15/2020 07/23/20   Alveria Apley, PA-C  etonogestrel-ethinyl estradiol (NUVARING) 0.12-0.015 MG/24HR vaginal ring Insert vaginally and leave in place for 3 consecutive weeks, then remove for 1 week. Patient not taking: Reported on 07/23/2020 07/03/20   [name], NP  ferrous sulfate 325 (65 FE) MG tablet Take 1 tablet (325 mg total) by mouth daily with breakfast. Patient not taking: Reported on 10/15/2020 07/08/20   [name], NP  fexofenadine-pseudoephedrine (ALLEGRA-D) 60-120 MG 12 hr tablet Take 1 tablet by mouth 2 (two) times daily. Patient not taking: Reported on 07/23/2020 07/03/20   [name], NP  fluticasone (  FLONASE) 50 MCG/ACT nasal spray Place 2 sprays into both nostrils daily. Patient not taking: Reported on 10/15/2020 09/05/20 10/05/20  Ivette LoyalSmith, Fowler R, NP  metroNIDAZOLE (METROGEL) 0.75 % vaginal gel Place 1 Applicatorful vaginally 2 (two) times daily. Patient not taking: Reported on 07/23/2020 07/10/20   Rushie Chestnutovington, Sarah M, PA-C  omeprazole (PRILOSEC) 20 MG capsule Take 1 capsule (20 mg total) by mouth daily. Patient not taking: Reported on 10/15/2020 07/10/20   Rushie Chestnutovington, Sarah M, PA-C  ondansetron (ZOFRAN) 4 MG tablet Take 1 tablet (4 mg total) by mouth every 6 (six) hours. Patient not taking: Reported on 10/15/2020 07/10/20   Rushie Chestnutovington, Sarah M, PA-C  polyethylene glycol powder [facility](MIRALAX) 17 GM/SCOOP powder Use 1-2  capfuls in 8 oz of liquid up to 3 times a day until you are having regular bowel movements Patient not taking: Reported on 10/15/2020 07/23/20   Caccavale, Sophia, PA-C  promethazine-dextromethorphan (PROMETHAZINE-DM) 6.25-15 MG/5ML syrup Take 5 mLs by mouth at bedtime as needed for cough. Patient not taking: Reported on 10/15/2020 08/07/20   Wallis BambergMani, Mario, PA-C  pseudoephedrine (SUDAFED) 60 MG tablet Take 1 tablet (60 mg total) by mouth every 8 (eight) hours as needed for congestion. Patient not taking: Reported on 10/15/2020 08/07/20   Wallis BambergMani, Mario, PA-C    Family History Family History  Problem Relation Age of Onset  . Heart disease Mother   . Stroke Mother   . Diabetes Maternal Grandmother     Social History Social History   Tobacco Use  . Smoking status: Never Smoker  . Smokeless tobacco: Never Used  Vaping Use  . Vaping Use: Never used  Substance Use Topics  . Alcohol use: No  . Drug use: No     Allergies   Bee venom and Wasp venom   Review of Systems Review of Systems  Defer to HPI   Physical Exam Triage Vital Signs ED Triage Vitals  Enc Vitals Group     BP 10/15/20 0847 114/79     Pulse Rate 10/15/20 0847 76     Resp 10/15/20 0847 20     Temp 10/15/20 0847 98.6 F (37 C)     Temp Source 10/15/20 0847 Oral     SpO2 10/15/20 0847 100 %     Weight --      Height --      Head Circumference --      Peak Flow --      Pain Score 10/15/20 0843 0     Pain Loc --      Pain Edu? --      Excl. in GC? --    No data found.  Updated Vital Signs BP 114/79 (BP Location: Left Arm) Comment (BP Location): small  Pulse 76   Temp 98.6 F (37 C) (Oral)   Resp 20   LMP 09/30/2020   SpO2 100%   Visual Acuity Right Eye Distance:   Left Eye Distance:   Bilateral Distance:    Right Eye Near:   Left Eye Near:    Bilateral Near:     Physical Exam Constitutional:      Appearance: Normal appearance. She is normal weight.  HENT:     Head: Normocephalic.  Eyes:      Extraocular Movements: Extraocular movements intact.     Conjunctiva/sclera: Conjunctivae normal.     Pupils: Pupils are equal, round, and reactive to light.  Cardiovascular:     Rate and Rhythm: Normal rate and regular rhythm.  Pulses: Normal pulses.     Heart sounds: Normal heart sounds.  Pulmonary:     Effort: Pulmonary effort is normal.     Breath sounds: Normal breath sounds.  Abdominal:     General: Abdomen is flat.     Palpations: Abdomen is soft.     Tenderness: There is no right CVA tenderness or left CVA tenderness.  Musculoskeletal:        General: Normal range of motion.     Cervical back: Normal range of motion.  Skin:    General: Skin is warm and dry.  Neurological:     Mental Status: She is alert and oriented to person, place, and time. Mental status is at baseline.  Psychiatric:        Mood and Affect: Mood normal.        Behavior: Behavior normal.        Thought Content: Thought content normal.        Judgment: Judgment normal.      UC Treatments / Results  Labs (all labs ordered are listed, but only abnormal results are displayed) Labs Reviewed  POCT URINALYSIS DIPSTICK, ED / UC - Abnormal; Notable for the following components:      Result Value   Hgb urine dipstick TRACE (*)    All other components within normal limits  CERVICOVAGINAL ANCILLARY ONLY    EKG   Radiology No results found.  Procedures Procedures (including critical care time)  Medications Ordered in UC Medications - No data to display  Initial Impression / Assessment and Plan / UC Course  I have reviewed the triage vital signs and the nursing notes.  Pertinent labs & imaging results that were available during my care of the patient were reviewed by me and considered in my medical decision making (see chart for details).  Clinical Course as of 10/15/20 0915  Tue Oct 15, 2020  0915 POCT Urinalysis Dipstick (ED/UC)(!) [AW]    Clinical Course User Index [AW] Valinda Hoar, NP    Asthma due to seasonal allergies Vaginal candida  1.  STI screening.-Will treat per protocol, advised abstinence until labs result and/or treatment is complete 2.  Diflucan 150 mg today 3.  Albuterol HFA inhaler 1 to 2 puffs every 6 hours as needed 4.  Zyrtec 10 mg daily 5.  Follow-up with PCP for persistent respiratory symptoms in 2 weeks 6.  Urinalysis negative  Final Clinical Impressions(s) / UC Diagnoses   Final diagnoses:  Vaginal candida  Asthma due to seasonal allergies     Discharge Instructions     Labs pending 2 to 3 days, you will be called if positive for treatment  Please do not have sex until lab results, if positive please do not have sex until treatment complete, if positive please notify partner so that they may be treated as well.  For possible yeast infection take 1 Diflucan pill today and in 3 days if still having symptoms you may take the second pill  Can use albuterol inhaler 1 to 2 puffs every 6 hours as needed when you feel you are feeling short of breath  Take Zyrtec daily at bedtime to help decrease allergy symptoms and ideally help with shortness of breath.  After 2 weeks if still feeling shortness of breath after consistent medication use please follow-up with primary care provider for reevaluation of asthma     ED Prescriptions    Medication Sig Dispense Auth. Provider   albuterol (VENTOLIN HFA) 108 (  90 Base) MCG/ACT inhaler Inhale 1-2 puffs into the lungs every 6 (six) hours as needed for wheezing or shortness of breath. 18 g Leah Thornberry R, NP   fluconazole (DIFLUCAN) 150 MG tablet Take 1 tablet (150 mg total) by mouth daily for 2 doses. 2 tablet Salli Quarry R, NP   cetirizine (ZYRTEC ALLERGY) 10 MG tablet Take 1 tablet (10 mg total) by mouth daily. 30 tablet Valinda Hoar, NP     PDMP not reviewed this encounter.   Valinda Hoar, NP 10/15/20 0918    Valinda Hoar, NP 10/15/20 [phone]

## 2020-10-15 NOTE — Discharge Instructions (Signed)
Labs pending 2 to 3 days, you will be called if positive for treatment  Please do not have sex until lab results, if positive please do not have sex until treatment complete, if positive please notify partner so that they may be treated as well.  For possible yeast infection take 1 Diflucan pill today and in 3 days if still having symptoms you may take the second pill  Can use albuterol inhaler 1 to 2 puffs every 6 hours as needed when you feel you are feeling short of breath  Take Zyrtec daily at bedtime to help decrease allergy symptoms and ideally help with shortness of breath.  After 2 weeks if still feeling shortness of breath after consistent medication use please follow-up with primary care provider for reevaluation of asthma

## 2020-10-16 LAB — CERVICOVAGINAL ANCILLARY ONLY
Bacterial Vaginitis (gardnerella): NEGATIVE
Candida Glabrata: POSITIVE — AB
Candida Vaginitis: POSITIVE — AB
Chlamydia: NEGATIVE
Comment: NEGATIVE
Comment: NEGATIVE
Comment: NEGATIVE
Comment: NEGATIVE
Comment: NEGATIVE
Comment: NORMAL
Neisseria Gonorrhea: NEGATIVE
Trichomonas: NEGATIVE

## 2020-11-12 ENCOUNTER — Ambulatory Visit (INDEPENDENT_AMBULATORY_CARE_PROVIDER_SITE_OTHER): Payer: Self-pay | Admitting: *Deleted

## 2020-11-12 NOTE — Telephone Encounter (Signed)
Called patient and LVM advising I was calling from [facility] In regards to scheduling a nurse visit. Advised patient to call [phone] to schedule.

## 2020-11-12 NOTE — Telephone Encounter (Signed)
Have her to come in as now and swab to rule out

## 2020-11-12 NOTE — Telephone Encounter (Signed)
FYI

## 2020-11-12 NOTE — Telephone Encounter (Signed)
Patient is reporting vaginal/vulvar symptoms- irritation and discharge. She is requesting STI check along with wet prep.  Reason for Disposition . [1] Symptoms of a "yeast infection" (i.e., itchy, white discharge, not bad smelling) AND [2] not improved > 3 days following CARE ADVICE  Answer Assessment - Initial Assessment Questions 1. SYMPTOM: "What's the main symptom you're concerned about?" (e.g., rash, itching, swelling, dryness)     Irritation-white discharge 2. LOCATION: "Where is the  irritation located?" (e.g., inside/outside, left/right)     vulva 3. ONSET: "When did the  irritation  start?"     3 days 4. PAIN: "Is there any pain?" If Yes, ask: "How bad is it?" (Scale: 1-10; mild, moderate, severe)   -  MILD (1-3): doesn't interfere with normal activities    -  MODERATE (4-7): interferes with normal activities (e.g., work or school) or awakens from sleep     -  SEVERE (8-10): excruciating pain, unable to do any normal activities     No pain 5. CAUSE: "What do you think is causing the symptoms?"     Question- change in detergent vs infection 6. OTHER SYMPTOMS: "Do you have any other symptoms?" (e.g., fever, vaginal bleeding, pain with urination)     White discharge- 3 days 7. PREGNANCY: "Is there any chance you are pregnant?" "When was your last menstrual period?"     No- LMP- last month  Protocols used: VULVAR [facility]

## 2020-11-14 ENCOUNTER — Ambulatory Visit (HOSPITAL_COMMUNITY)
Admission: EM | Admit: 2020-11-14 | Discharge: 2020-11-14 | Disposition: A | Discharge status: Home / Self Care | Payer: Medicaid Other | Attending: Emergency Medicine | Admitting: Emergency Medicine

## 2020-11-14 ENCOUNTER — Other Ambulatory Visit: Payer: Self-pay

## 2020-11-14 ENCOUNTER — Encounter (HOSPITAL_COMMUNITY): Payer: Self-pay

## 2020-11-14 DIAGNOSIS — Z113 Encounter for screening for infections with a predominantly sexual mode of transmission: Secondary | ICD-10-CM | POA: Diagnosis present

## 2020-11-14 DIAGNOSIS — B373 Candidiasis of vulva and vagina: Secondary | ICD-10-CM | POA: Insufficient documentation

## 2020-11-14 DIAGNOSIS — B3731 Acute candidiasis of vulva and vagina: Secondary | ICD-10-CM

## 2020-11-14 LAB — POC URINE PREG, ED: Preg Test, Ur: NEGATIVE

## 2020-11-14 LAB — POCT URINALYSIS DIPSTICK, ED / UC
Bilirubin Urine: NEGATIVE
Glucose, UA: NEGATIVE mg/dL
Hgb urine dipstick: NEGATIVE
Ketones, ur: NEGATIVE mg/dL
Nitrite: NEGATIVE
Protein, ur: NEGATIVE mg/dL
Specific Gravity, Urine: 1.02 (ref 1.005–1.030)
Urobilinogen, UA: 0.2 mg/dL (ref 0.0–1.0)
pH: 7 (ref 5.0–8.0)

## 2020-11-14 MED ORDER — FLUCONAZOLE 150 MG PO TABS: 150.0000 mg | ORAL_TABLET | Freq: Every day | ORAL | 0 refills | Status: DC

## 2020-11-14 NOTE — Discharge Instructions (Addendum)
Take the Diflucan one pill today.  Take the second pill in 3 days if you are still having symptoms.    Stop using the soap/body wash.  Make sure that your soap is good for sensitive skin, unscented, and without artificial dyes.    We will contact you with the results from your lab work and any additional treatment.    Do not have sex while taking undergoing treatment for STI.  Make sure that all of your partners get tested and treated.   Use a condom or other barrier method for all sexual encounters.    Return or go to the Emergency Department if symptoms worsen or do not improve in the next few days.

## 2020-11-14 NOTE — ED Provider Notes (Signed)
[facility]    CSN: [id number] Arrival date & time: 11/14/20  1731      History   Chief Complaint Chief Complaint  Patient presents with  . Vaginal Discharge    HPI Valerie Newman is a 20 y.o. female.   Patient here for evaluation of vaginal itching and discharge that has been ongoing for the past 3 days.  Patient reports recently starting to use a new soap.  Reports irritation has gotten increasingly worse.  Reports having thick white discharge.  Denies any odor.  Reports some burning with urination but denies any urgency or frequency.  Denies any trauma, injury, or other precipitating event.  Denies any specific alleviating or aggravating factors.  Denies any fevers, chest pain, shortness of breath, N/V/D, numbness, tingling, weakness, abdominal pain, or headaches.    The history is provided by the patient.    Past Medical History:  Diagnosis Date  . 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 Allergy to pollen   . Anemia   . Anxiety   . Asthma     Patient Active Problem List   Diagnosis Date Noted  . Nexplanon insertion   . Chlamydia infection 10/15/2017    Past Surgical History:  Procedure Laterality Date  . TOOTH EXTRACTION      OB History    Gravida  1   Para  1   Term  1   Preterm  0   AB  0   Living  1     SAB  0   IAB  0   Ectopic  0   Multiple  0   Live Births  1            Home Medications    Prior to Admission medications   Medication Sig Start Date End Date Taking? Authorizing Provider  fluconazole (DIFLUCAN) 150 MG tablet Take 1 tablet (150 mg total) by mouth daily. Take one tablet now and one in 3 days if you are still having symptoms 11/14/20  Yes Ivette Loyal, NP  albuterol (VENTOLIN HFA) 108 (90 Base) MCG/ACT inhaler Inhale 1-2 puffs into the lungs every 6 (six) hours as needed for wheezing or shortness of breath. 10/15/20   White, Elita Boone, NP  benzonatate (TESSALON) 100 MG capsule Take 1-2 capsules (100-200 mg  total) by mouth 3 (three) times daily as needed for cough. Patient not taking: Reported on 10/15/2020 08/07/20   Wallis Bamberg, PA-C  cetirizine (ZYRTEC ALLERGY) 10 MG tablet Take 1 tablet (10 mg total) by mouth daily. 10/15/20   White, Elita Boone, NP  cetirizine-pseudoephedrine (ZYRTEC-D) 5-120 MG tablet Take 1 tablet by mouth daily. Patient not taking: Reported on 10/15/2020 09/05/20   Ivette Loyal, NP  docusate sodium (COLACE) 100 MG capsule Take 1 capsule (100 mg total) by mouth daily. Patient not taking: Reported on 10/15/2020 07/23/20   Alveria Apley, PA-C  etonogestrel-ethinyl estradiol (NUVARING) 0.12-0.015 MG/24HR vaginal ring Insert vaginally and leave in place for 3 consecutive weeks, then remove for 1 week. Patient not taking: Reported on 07/23/2020 07/03/20   [name], NP  ferrous sulfate 325 (65 FE) MG tablet Take 1 tablet (325 mg total) by mouth daily with breakfast. Patient not taking: Reported on 10/15/2020 07/08/20   [name], NP  fexofenadine-pseudoephedrine (ALLEGRA-D) 60-120 MG 12 hr tablet Take 1 tablet by mouth 2 (two) times daily. Patient not taking: Reported on 07/23/2020 07/03/20   [name], NP  fluticasone (FLONASE) 50 MCG/ACT nasal spray Place 2 sprays into both nostrils daily. Patient not taking: Reported on 10/15/2020 09/05/20 10/05/20  Ivette Loyal, NP  metroNIDAZOLE (METROGEL) 0.75 % vaginal gel Place 1 Applicatorful vaginally 2 (two) times daily. Patient not taking: Reported on 07/23/2020 07/10/20   Rushie Chestnut, PA-C  omeprazole (PRILOSEC) 20 MG capsule Take 1 capsule (20 mg total) by mouth daily. Patient not taking: Reported on 10/15/2020 07/10/20   Rushie Chestnut, PA-C  ondansetron (ZOFRAN) 4 MG tablet Take 1 tablet (4 mg total) by mouth every 6 (six) hours. Patient not taking: Reported on 10/15/2020 07/10/20   Rushie Chestnut, PA-C  polyethylene glycol powder [facility]) 17 GM/SCOOP powder Use 1-2 capfuls in 8 oz of liquid up to 3 times a  day until you are having regular bowel movements Patient not taking: Reported on 10/15/2020 07/23/20   Caccavale, Sophia, PA-C  promethazine-dextromethorphan (PROMETHAZINE-DM) 6.25-15 MG/5ML syrup Take 5 mLs by mouth at bedtime as needed for cough. Patient not taking: Reported on 10/15/2020 08/07/20   Wallis Bamberg, PA-C  pseudoephedrine (SUDAFED) 60 MG tablet Take 1 tablet (60 mg total) by mouth every 8 (eight) hours as needed for congestion. Patient not taking: Reported on 10/15/2020 08/07/20   Wallis Bamberg, PA-C    Family History Family History  Problem Relation Age of Onset  . Heart disease Mother   . Stroke Mother   . Diabetes Maternal Grandmother     Social History Social History   Tobacco Use  . Smoking status: Never Smoker  . Smokeless tobacco: Never Used  Vaping Use  . Vaping Use: Never used  Substance Use Topics  . Alcohol use: No  . Drug use: No     Allergies   Bee venom and Wasp venom   Review of Systems Review of Systems  Genitourinary: Positive for dysuria and vaginal discharge.  All other systems reviewed and are negative.    Physical Exam Triage Vital Signs ED Triage Vitals  Enc Vitals Group     BP 11/14/20 1749 105/66     Pulse Rate 11/14/20 1749 72     Resp 11/14/20 1749 16     Temp 11/14/20 1749 97.6 F (36.4 C)     Temp Source 11/14/20 1749 Oral     SpO2 11/14/20 1749 97 %     Weight --      Height --      Head Circumference --      Peak Flow --      Pain Score 11/14/20 1748 0     Pain Loc --      Pain Edu? --      Excl. in GC? --    No data found.  Updated Vital Signs BP 105/66 (BP Location: Right Arm)   Pulse 72   Temp 97.6 F (36.4 C) (Oral)   Resp 16   LMP 10/28/2020 (Exact Date)   SpO2 97%   Visual Acuity Right Eye Distance:   Left Eye Distance:   Bilateral Distance:    Right Eye Near:   Left Eye Near:    Bilateral Near:     Physical Exam Vitals and nursing note reviewed.  Constitutional:      General: She is not in acute  distress.    Appearance: Normal appearance. She is not ill-appearing, toxic-appearing or diaphoretic.  HENT:     Head: Normocephalic and atraumatic.  Eyes:     Conjunctiva/sclera: Conjunctivae normal.  Cardiovascular:  Rate and Rhythm: Normal rate.     Pulses: Normal pulses.  Pulmonary:     Effort: Pulmonary effort is normal.  Abdominal:     General: Abdomen is flat.     Tenderness: There is no right CVA tenderness or left CVA tenderness.  Musculoskeletal:        General: Normal range of motion.     Cervical back: Normal range of motion.  Skin:    General: Skin is warm and dry.  Neurological:     General: No focal deficit present.     Mental Status: She is alert and oriented to person, place, and time.  Psychiatric:        Mood and Affect: Mood normal.      UC Treatments / Results  Labs (all labs ordered are listed, but only abnormal results are displayed) Labs Reviewed  POCT URINALYSIS DIPSTICK, ED / UC - Abnormal; Notable for the following components:      Result Value   Leukocytes,Ua SMALL (*)    All other components within normal limits  URINE CULTURE  POC URINE PREG, ED  CERVICOVAGINAL ANCILLARY ONLY    EKG   Radiology No results found.  Procedures Procedures (including critical care time)  Medications Ordered in UC Medications - No data to display  Initial Impression / Assessment and Plan / UC Course  I have reviewed the triage vital signs and the nursing notes.  Pertinent labs & imaging results that were available during my care of the patient were reviewed by me and considered in my medical decision making (see chart for details).     Assessment negative for red flags or concerns.  Likely yeast infection.  We will treat with diflucan once today and repeat in 3 days if still having symptoms.  Discussed stopping soap that is causing irritation.  Only use soap that is good for sensitive skin, unscented, and without any artificial dyes.  Self swab  obtained and will treat based on results.  Discussed safe sex practices including condom or other barrier method use.  Follow-up with primary care as needed.  Final Clinical Impressions(s) / UC Diagnoses   Final diagnoses:  Yeast vaginitis  Screen for STD (sexually transmitted disease)     Discharge Instructions     Take the Diflucan one pill today.  Take the second pill in 3 days if you are still having symptoms.    Stop using the soap/body wash.  Make sure that your soap is good for sensitive skin, unscented, and without artificial dyes.    We will contact you with the results from your lab work and any additional treatment.    Do not have sex while taking undergoing treatment for STI.  Make sure that all of your partners get tested and treated.   Use a condom or other barrier method for all sexual encounters.    Return or go to the Emergency Department if symptoms worsen or do not improve in the next few days.     ED Prescriptions    Medication Sig Dispense Auth. Provider   fluconazole (DIFLUCAN) 150 MG tablet Take 1 tablet (150 mg total) by mouth daily. Take one tablet now and one in 3 days if you are still having symptoms 2 tablet Ivette Loyal, NP     PDMP not reviewed this encounter.   Ivette Loyal, NP 11/14/20 [phone]

## 2020-11-14 NOTE — ED Triage Notes (Addendum)
Pt in with c/o white vaginal discharge x 3 days  Pt also c/o slight discomfort and itching. Pt states she feels like she has been urinating more frequently   States she used a new scented soap

## 2020-11-15 ENCOUNTER — Telehealth (HOSPITAL_COMMUNITY): Payer: Self-pay | Admitting: Emergency Medicine

## 2020-11-15 LAB — CERVICOVAGINAL ANCILLARY ONLY
Bacterial Vaginitis (gardnerella): POSITIVE — AB
Candida Glabrata: POSITIVE — AB
Candida Vaginitis: POSITIVE — AB
Chlamydia: NEGATIVE
Comment: NEGATIVE
Comment: NEGATIVE
Comment: NEGATIVE
Comment: NEGATIVE
Comment: NEGATIVE
Comment: NORMAL
Neisseria Gonorrhea: NEGATIVE
Trichomonas: NEGATIVE

## 2020-11-15 MED ORDER — METRONIDAZOLE 500 MG PO TABS: 500.0000 mg | ORAL_TABLET | Freq: Two times a day (BID) | ORAL | 0 refills | Status: DC

## 2020-11-16 LAB — URINE CULTURE

## 2020-11-22 ENCOUNTER — Telehealth (HOSPITAL_COMMUNITY): Payer: Self-pay | Admitting: Emergency Medicine

## 2020-11-22 MED ORDER — METRONIDAZOLE 0.75 % VA GEL: 1.0000 | Freq: Every day | VAGINAL | 0 refills | Status: AC

## 2020-11-22 NOTE — Telephone Encounter (Signed)
Patient called and states she cannot swallow the Metronidazole pills and would like the gel.

## 2020-12-11 ENCOUNTER — Encounter (HOSPITAL_COMMUNITY): Payer: Self-pay

## 2020-12-11 ENCOUNTER — Ambulatory Visit (HOSPITAL_COMMUNITY)
Admission: EM | Admit: 2020-12-11 | Discharge: 2020-12-11 | Disposition: A | Discharge status: Home / Self Care | Payer: Medicaid Other | Attending: Medical Oncology | Admitting: Medical Oncology

## 2020-12-11 ENCOUNTER — Other Ambulatory Visit: Payer: Self-pay

## 2020-12-11 DIAGNOSIS — J069 Acute upper respiratory infection, unspecified: Secondary | ICD-10-CM | POA: Insufficient documentation

## 2020-12-11 DIAGNOSIS — R42 Dizziness and giddiness: Secondary | ICD-10-CM | POA: Diagnosis not present

## 2020-12-11 DIAGNOSIS — Z20822 Contact with and (suspected) exposure to covid-19: Secondary | ICD-10-CM | POA: Insufficient documentation

## 2020-12-11 DIAGNOSIS — Z881 Allergy status to other antibiotic agents status: Secondary | ICD-10-CM | POA: Diagnosis not present

## 2020-12-11 DIAGNOSIS — R531 Weakness: Secondary | ICD-10-CM | POA: Insufficient documentation

## 2020-12-11 DIAGNOSIS — J020 Streptococcal pharyngitis: Secondary | ICD-10-CM

## 2020-12-11 DIAGNOSIS — M79606 Pain in leg, unspecified: Secondary | ICD-10-CM | POA: Insufficient documentation

## 2020-12-11 DIAGNOSIS — B349 Viral infection, unspecified: Secondary | ICD-10-CM | POA: Diagnosis not present

## 2020-12-11 LAB — SARS CORONAVIRUS 2 (TAT 6-24 HRS): SARS Coronavirus 2: NEGATIVE

## 2020-12-11 LAB — POCT RAPID STREP A, ED / UC: Streptococcus, Group A Screen (Direct): POSITIVE — AB

## 2020-12-11 MED ORDER — ALBUTEROL SULFATE HFA 108 (90 BASE) MCG/ACT IN AERS: 1.0000 | INHALATION_SPRAY | Freq: Four times a day (QID) | RESPIRATORY_TRACT | 0 refills | Status: DC | PRN

## 2020-12-11 MED ORDER — FLUTICASONE PROPIONATE 50 MCG/ACT NA SUSP: 2.0000 | Freq: Every day | NASAL | 0 refills | Status: DC

## 2020-12-11 MED ORDER — AMOXICILLIN 500 MG PO CAPS: 500.0000 mg | ORAL_CAPSULE | Freq: Two times a day (BID) | ORAL | 0 refills | Status: AC

## 2020-12-11 NOTE — ED Provider Notes (Addendum)
[facility]    CSN: [id number] Arrival date & time: 12/11/20  3546      History   Chief Complaint Chief Complaint  Patient presents with   Sore Throat   Weakness   Otalgia   Dizziness   Leg Pain    HPI Valerie Newman is a 20 y.o. female.   HPI  Cold Symptoms: Pt reports that since yesterday she has had fatigue, weakness, ear pain and a sore throat. She also noted overall body aches and mild dizziness that occurred yesterday and has fully resolved. She has not tried anything for symptoms. No chest pain, SOB, known fevers.   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 Authorizing Provider  cetirizine (ZYRTEC ALLERGY) 10 MG tablet Take 1 tablet (10 mg total) by mouth daily. 10/15/20  Yes White, Adrienne R, NP  fluticasone (FLONASE) 50 MCG/ACT nasal spray Place 2 sprays into both nostrils daily. 12/11/20  Yes Johnte Portnoy M, PA-C  albuterol (VENTOLIN HFA) 108 (90 Base) MCG/ACT inhaler Inhale 1-2 puffs into the lungs every 6 (six) hours as needed for wheezing or shortness of breath. 12/11/20   Rushie Chestnut, PA-C  benzonatate (TESSALON) 100 MG capsule Take 1-2 capsules (100-200 mg total) by mouth 3 (three) times daily as needed for cough. Patient not taking: Reported on 10/15/2020 08/07/20   Wallis Bamberg, PA-C  cetirizine-pseudoephedrine (ZYRTEC-D) 5-120 MG tablet Take 1 tablet by mouth daily. Patient not  taking: Reported on 10/15/2020 09/05/20   Ivette Loyal, NP  docusate sodium (COLACE) 100 MG capsule Take 1 capsule (100 mg total) by mouth daily. Patient not taking: Reported on 10/15/2020 07/23/20   Alveria Apley, PA-C  etonogestrel-ethinyl estradiol (NUVARING) 0.12-0.015 MG/24HR vaginal ring Insert vaginally and leave in place for 3 consecutive weeks, then remove for 1 week. Patient not taking: Reported on 07/23/2020 07/03/20   [name], NP  ferrous sulfate 325 (65 FE) MG tablet Take 1 tablet (325 mg total) by mouth daily with breakfast. Patient not taking: Reported on 10/15/2020 07/08/20   [name], NP  fexofenadine-pseudoephedrine (ALLEGRA-D) 60-120 MG 12 hr tablet Take 1 tablet by mouth 2 (two) times daily. Patient not taking: Reported on 07/23/2020 07/03/20   [name], NP  omeprazole (PRILOSEC) 20 MG capsule Take 1 capsule (20 mg total) by mouth daily. Patient not taking: Reported on 10/15/2020 07/10/20   Rushie Chestnut, PA-C  ondansetron (ZOFRAN) 4 MG tablet Take 1 tablet (4 mg total) by mouth every 6 (six) hours. Patient not taking: Reported on 10/15/2020 07/10/20  Clent Jacks M, PA-C  polyethylene glycol powder [facility]) 17 GM/SCOOP powder Use 1-2 capfuls in 8 oz of liquid up to 3 times a day until you are having regular bowel movements Patient not taking: Reported on 10/15/2020 07/23/20   Caccavale, Sophia, PA-C  promethazine-dextromethorphan (PROMETHAZINE-DM) 6.25-15 MG/5ML syrup Take 5 mLs by mouth at bedtime as needed for cough. Patient not taking: Reported on 10/15/2020 08/07/20   Wallis Bamberg, PA-C  pseudoephedrine (SUDAFED) 60 MG tablet Take 1 tablet (60 mg total) by mouth every 8 (eight) hours as needed for congestion. Patient not taking: Reported on 10/15/2020 08/07/20   Wallis Bamberg, PA-C    Family History Family History  Problem Relation Age of Onset   Heart disease Mother    Stroke Mother    Hypertension Maternal Grandmother    Diabetes Maternal  Grandmother    Diabetes Maternal Grandfather    Diabetes Maternal Uncle    Diabetes Maternal Aunt     Social History Social History   Tobacco Use   Smoking status: Never   Smokeless tobacco: Never  Vaping Use   Vaping Use: Never used  Substance Use Topics   Alcohol use: No   Drug use: No     Allergies   Bee venom, Metronidazole, and Wasp venom   Review of Systems Review of Systems  As stated above in HPI Physical Exam Triage Vital Signs ED Triage Vitals  Enc Vitals Group     BP 12/11/20 0946 111/73     Pulse Rate 12/11/20 0946 (!) 103     Resp 12/11/20 0946 18     Temp 12/11/20 0946 98.7 F (37.1 C)     Temp src --      SpO2 12/11/20 0946 94 %     Weight --      Height --      Head Circumference --      Peak Flow --      Pain Score 12/11/20 0943 8     Pain Loc --      Pain Edu? --      Excl. in GC? --    No data found.  Updated Vital Signs BP 111/73   Pulse (!) 103   Temp 98.7 F (37.1 C)   Resp 18   LMP 11/13/2020 (Approximate)   SpO2 94%   Physical Exam Vitals and nursing note reviewed.  Constitutional:      General: She is not in acute distress.    Appearance: She is well-developed. She is ill-appearing. She is not toxic-appearing or diaphoretic.  HENT:     Head: Normocephalic and atraumatic.     Right Ear: Tympanic membrane normal. No tenderness. No middle ear effusion. Tympanic membrane is not erythematous.     Left Ear: Tympanic membrane normal. No tenderness.  No middle ear effusion. Tympanic membrane is not erythematous.     Nose: Rhinorrhea present.     Mouth/Throat:     Mouth: Mucous membranes are moist.     Pharynx: Posterior oropharyngeal erythema present. No pharyngeal swelling or uvula swelling.     Tonsils: No tonsillar exudate or tonsillar abscesses.  Eyes:     Conjunctiva/sclera: Conjunctivae normal.     Pupils: Pupils are equal, round, and reactive to light.  Cardiovascular:     Rate and Rhythm: Normal rate and regular  rhythm.     Heart sounds: Normal heart sounds.  Pulmonary:     Effort: Pulmonary effort is normal.     Breath sounds:  Normal breath sounds.  Musculoskeletal:     Cervical back: Neck supple.  Lymphadenopathy:     Cervical: No cervical adenopathy.  Skin:    General: Skin is warm.  Neurological:     Mental Status: She is alert.     UC Treatments / Results  Labs (all labs ordered are listed, but only abnormal results are displayed) Labs Reviewed  CULTURE, GROUP A STREP ([facility])  SARS CORONAVIRUS 2 (TAT 6-24 HRS)  POCT RAPID STREP A, ED / UC    EKG   Radiology No results found.  Procedures Procedures (including critical care time)  Medications Ordered in UC Medications - No data to display  Initial Impression / Assessment and Plan / UC Course  I have reviewed the triage vital signs and the nursing notes.  Pertinent labs & imaging results that were available during my care of the patient were reviewed by me and considered in my medical decision making (see chart for details).     New.  Likely viral process which I discussed with patient.  We will test for strep pharyngitis as well as COVID-19.  For now rest, hydration with water and supportive care.  Discussed red flag signs symptoms.  UPDATE: Strep is positive. Treating with Amoxil to prevent systemic complication. Discussed with patient via phone.  Final Clinical Impressions(s) / UC Diagnoses   Final diagnoses:  Viral URI   Discharge Instructions   None    ED Prescriptions     Medication Sig Dispense Auth. Provider   albuterol (VENTOLIN HFA) 108 (90 Base) MCG/ACT inhaler Inhale 1-2 puffs into the lungs every 6 (six) hours as needed for wheezing or shortness of breath. 18 g Aravind Chrismer M, PA-C   fluticasone [facility]) 50 MCG/ACT nasal spray Place 2 sprays into both nostrils daily. 16 mL Rushie Chestnut, [place]      PDMP not reviewed this encounter.   Rushie Chestnut, PA-C 12/11/20 1022     [address] PA-C 12/11/20 1102    Rushie Chestnut, [place] 12/11/20 1103

## 2020-12-11 NOTE — ED Triage Notes (Addendum)
Pt c/o weakness, throat and bilateral ear pain, lightheadedness (yesterday, no longer), thigh pain when walking. Symptoms started day before yesterday per pt.  Noted pt fell asleep while sitting upright in chair several seconds after speaking.

## 2020-12-17 ENCOUNTER — Other Ambulatory Visit: Payer: Self-pay

## 2020-12-17 ENCOUNTER — Ambulatory Visit (HOSPITAL_COMMUNITY)
Admission: EM | Admit: 2020-12-17 | Discharge: 2020-12-17 | Disposition: A | Discharge status: Home / Self Care | Payer: Medicaid Other | Attending: Student | Admitting: Student

## 2020-12-17 ENCOUNTER — Encounter (HOSPITAL_COMMUNITY): Payer: Self-pay | Admitting: Emergency Medicine

## 2020-12-17 DIAGNOSIS — J020 Streptococcal pharyngitis: Secondary | ICD-10-CM | POA: Insufficient documentation

## 2020-12-17 DIAGNOSIS — Z0189 Encounter for other specified special examinations: Secondary | ICD-10-CM | POA: Insufficient documentation

## 2020-12-17 DIAGNOSIS — R55 Syncope and collapse: Secondary | ICD-10-CM | POA: Diagnosis present

## 2020-12-17 DIAGNOSIS — G44209 Tension-type headache, unspecified, not intractable: Secondary | ICD-10-CM

## 2020-12-17 LAB — COMPREHENSIVE METABOLIC PANEL
ALT: 10 U/L (ref 0–44)
AST: 16 U/L (ref 15–41)
Albumin: 3.5 g/dL (ref 3.5–5.0)
Alkaline Phosphatase: 46 U/L (ref 38–126)
Anion gap: 6 (ref 5–15)
BUN: 9 mg/dL (ref 6–20)
CO2: 28 mmol/L (ref 22–32)
Calcium: 9.2 mg/dL (ref 8.9–10.3)
Chloride: 104 mmol/L (ref 98–111)
Creatinine, Ser: 0.64 mg/dL (ref 0.44–1.00)
GFR, Estimated: >60 mL/min (ref >60)
Glucose, Bld: 82 mg/dL (ref 70–99)
Potassium: 4 mmol/L (ref 3.5–5.1)
Sodium: 138 mmol/L (ref 135–145)
Total Bilirubin: 0.4 mg/dL (ref 0.3–1.2)
Total Protein: 7 g/dL (ref 6.5–8.1)

## 2020-12-17 LAB — CBC
HCT: 34 % — ABNORMAL LOW (ref 36.0–46.0)
Hemoglobin: 10.4 g/dL — ABNORMAL LOW (ref 12.0–15.0)
MCH: 24.8 pg — ABNORMAL LOW (ref 26.0–34.0)
MCHC: 30.6 g/dL (ref 30.0–36.0)
MCV: 81.1 fL (ref 80.0–100.0)
Platelets: 257 10*3/uL (ref 150–400)
RBC: 4.19 MIL/uL (ref 3.87–5.11)
RDW: 14.7 % (ref 11.5–15.5)
WBC: 7.9 10*3/uL (ref 4.0–10.5)
nRBC: 0 % (ref 0.0–0.2)

## 2020-12-17 LAB — POCT URINALYSIS DIPSTICK, ED / UC
Bilirubin Urine: NEGATIVE
Glucose, UA: NEGATIVE mg/dL
Hgb urine dipstick: NEGATIVE
Ketones, ur: NEGATIVE mg/dL
Leukocytes,Ua: NEGATIVE
Nitrite: NEGATIVE
Protein, ur: 30 mg/dL — AB
Specific Gravity, Urine: 1.02 (ref 1.005–1.030)
Urobilinogen, UA: 1 mg/dL (ref 0.0–1.0)
pH: 8.5 — ABNORMAL HIGH (ref 5.0–8.0)

## 2020-12-17 LAB — CBG MONITORING, ED: Glucose-Capillary: 113 mg/dL — ABNORMAL HIGH (ref 70–99)

## 2020-12-17 LAB — POC URINE PREG, ED: Preg Test, Ur: NEGATIVE

## 2020-12-17 LAB — POCT RAPID STREP A, ED / UC: Streptococcus, Group A Screen (Direct): POSITIVE — AB

## 2020-12-17 NOTE — ED Provider Notes (Signed)
[facility]    CSN: [id number] Arrival date & time: 12/17/20  1048      History   Chief Complaint Chief Complaint  Patient presents with   Weakness    HPI Adylynn Hertenstein is a 20 y.o. female presenting with continued weakness and headaches following diagnosis of strep- improving. She has been taking the amoxicillin as directed and is on day 6/7. Also with medical history of anemia, anxiety, asthma, allergies. States she she has had continued fatigue and episodes of lightheadedness since her strep diagnosis. States she sore throat is completely gone and is eating and drinking normally. The episodes are improving, but she is frustrated because they are limiting her ability to work. Lightheadedness only occurs after prolonged standing and working. 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 States sitting down and breathing deeply resolved the issue and she did not actually pass out. She has had episodes of throbbing headache behind forehead but denies worst headache of life, vision changes, weakness in arms or legs. Denies current headache. Denies shortness of breath, chest pain, vision changes. Denies abd pain or urinary symptoms .As I am leaving the room, patient also mentions that she has been taking "energy pills" that her friend gave her. She does not know what is in these energy pills. She hasn't taken them in 1 week.   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 Authorizing Provider  amoxicillin (AMOXIL) 500 MG capsule Take 1 capsule (500 mg total) by mouth 2 (two) times daily for 10 days. 12/11/20 12/21/20 Yes Covington, Brand Males, PA-C  albuterol (VENTOLIN HFA) 108 (90 Base) MCG/ACT inhaler Inhale 1-2 puffs into the lungs every 6 (six) hours as needed for wheezing or shortness of breath. 12/11/20   Rushie Chestnut, PA-C  benzonatate (TESSALON) 100 MG capsule Take 1-2 capsules (100-200 mg total) by mouth 3 (three) times daily as needed for cough. Patient not taking: Reported on 10/15/2020 08/07/20   Wallis Bamberg, PA-C  cetirizine (ZYRTEC ALLERGY) 10 MG tablet Take 1 tablet (10 mg total) by mouth daily. Patient not taking: Reported on 12/17/2020 10/15/20   Valinda Hoar, NP  cetirizine-pseudoephedrine (ZYRTEC-D) 5-120 MG tablet Take 1 tablet by mouth daily. Patient not taking: Reported on 10/15/2020 09/05/20   Ivette Loyal, NP  docusate sodium (COLACE) 100 MG capsule Take 1 capsule (100 mg total) by mouth daily. Patient not taking: Reported on 10/15/2020 07/23/20   Alveria Apley, PA-C  etonogestrel-ethinyl estradiol (  NUVARING) 0.12-0.015 MG/24HR vaginal ring Insert vaginally and leave in place for 3 consecutive weeks, then remove for 1 week. Patient not taking: Reported on 07/23/2020 07/03/20   [name], NP  ferrous sulfate 325 (65 FE) MG tablet Take 1 tablet (325 mg total) by mouth daily with breakfast. Patient not taking: Reported on 10/15/2020 07/08/20   [name], NP  fexofenadine-pseudoephedrine (ALLEGRA-D) 60-120 MG 12 hr tablet Take 1 tablet by mouth 2 (two) times daily. Patient not taking: Reported on 07/23/2020 07/03/20   [name], NP  fluticasone [facility]) 50 MCG/ACT nasal spray Place 2 sprays into both nostrils daily. 12/11/20   Rushie Chestnut, PA-C  omeprazole (PRILOSEC) 20 MG capsule Take 1 capsule  (20 mg total) by mouth daily. Patient not taking: Reported on 10/15/2020 07/10/20   Rushie Chestnut, PA-C  ondansetron (ZOFRAN) 4 MG tablet Take 1 tablet (4 mg total) by mouth every 6 (six) hours. Patient not taking: Reported on 10/15/2020 07/10/20   Rushie Chestnut, PA-C  polyethylene glycol powder [facility]) 17 GM/SCOOP powder Use 1-2 capfuls in 8 oz of liquid up to 3 times a day until you are having regular bowel movements Patient not taking: Reported on 10/15/2020 07/23/20   Caccavale, Sophia, PA-C  promethazine-dextromethorphan (PROMETHAZINE-DM) 6.25-15 MG/5ML syrup Take 5 mLs by mouth at bedtime as needed for cough. Patient not taking: Reported on 10/15/2020 08/07/20   Wallis Bamberg, PA-C  pseudoephedrine (SUDAFED) 60 MG tablet Take 1 tablet (60 mg total) by mouth every 8 (eight) hours as needed for congestion. Patient not taking: Reported on 10/15/2020 08/07/20   Wallis Bamberg, PA-C    Family History Family History  Problem Relation Age of Onset   Heart disease Mother    Stroke Mother    Hypertension Maternal Grandmother    Diabetes Maternal Grandmother    Diabetes Maternal Grandfather    Diabetes Maternal Uncle    Diabetes Maternal Aunt     Social History Social History   Tobacco Use   Smoking status: Never   Smokeless tobacco: Never  Vaping Use   Vaping Use: Never used  Substance Use Topics   Alcohol use: No   Drug use: No     Allergies   Bee venom, Metronidazole, and Wasp venom   Review of Systems Review of Systems  Constitutional:  Negative for appetite change, chills and fever.  HENT:  Negative for congestion, ear pain, rhinorrhea, sinus pressure, sinus pain and sore throat.   Eyes:  Negative for redness and visual disturbance.  Respiratory:  Negative for cough, chest tightness, shortness of breath and wheezing.   Cardiovascular:  Negative for chest pain and palpitations.  Gastrointestinal:  Negative for abdominal pain, constipation, diarrhea, nausea and vomiting.   Genitourinary:  Negative for dysuria, frequency and urgency.  Musculoskeletal:  Negative for myalgias.  Neurological:  Positive for light-headedness and headaches. Negative for dizziness, syncope and weakness.  Psychiatric/Behavioral:  Negative for confusion.   All other systems reviewed and are negative.   Physical Exam Triage Vital Signs ED Triage Vitals  Enc Vitals Group     BP 12/17/20 1104 114/70     Pulse Rate 12/17/20 1104 87     Resp 12/17/20 1104 18     Temp 12/17/20 1104 99.1 F (37.3 C)     Temp Source 12/17/20 1104 Oral     SpO2 12/17/20 1104 100 %     Weight --      Height --  Head Circumference --      Peak Flow --      Pain Score 12/17/20 1101 0     Pain Loc --      Pain Edu? --      Excl. in GC? --    No data found.  Updated Vital Signs BP 114/70 (BP Location: Right Arm)   Pulse 87   Temp 99.1 F (37.3 C) (Oral)   Resp 18   LMP 11/24/2020   SpO2 100%   Visual Acuity Right Eye Distance:   Left Eye Distance:   Bilateral Distance:    Right Eye Near:   Left Eye Near:    Bilateral Near:     Physical Exam Vitals reviewed.  Constitutional:      General: She is not in acute distress.    Appearance: Normal appearance. She is not ill-appearing.  HENT:     Head: Normocephalic and atraumatic.     Right Ear: Hearing, tympanic membrane, ear canal and external ear normal. No swelling or tenderness. There is no impacted cerumen. No mastoid tenderness. Tympanic membrane is not perforated, erythematous, retracted or bulging.     Left Ear: Hearing, tympanic membrane, ear canal and external ear normal. No swelling or tenderness. There is no impacted cerumen. No mastoid tenderness. Tympanic membrane is not perforated, erythematous, retracted or bulging.     Nose:     Right Sinus: No maxillary sinus tenderness or frontal sinus tenderness.     Left Sinus: No maxillary sinus tenderness or frontal sinus tenderness.     Mouth/Throat:     Mouth: Mucous membranes  are moist.     Pharynx: Uvula midline. No oropharyngeal exudate or posterior oropharyngeal erythema.     Tonsils: No tonsillar exudate.  Eyes:     Extraocular Movements: Extraocular movements intact.     Pupils: Pupils are equal, round, and reactive to light.  Cardiovascular:     Rate and Rhythm: Normal rate and regular rhythm.     Heart sounds: Normal heart sounds.  Pulmonary:     Effort: Pulmonary effort is normal.     Breath sounds: Normal breath sounds and air entry. No wheezing, rhonchi or rales.  Chest:     Chest wall: No tenderness.  Abdominal:     General: Abdomen is flat. Bowel sounds are normal.     Tenderness: There is no abdominal tenderness. There is no guarding or rebound.  Musculoskeletal:     Cervical back: Normal range of motion and neck supple. No rigidity.  Lymphadenopathy:     Cervical: No cervical adenopathy.  Skin:    Capillary Refill: Capillary refill takes less than 2 seconds.  Neurological:     General: No focal deficit present.     Mental Status: She is alert and oriented to person, place, and time. Mental status is at baseline.     Cranial Nerves: Cranial nerves are intact. No cranial nerve deficit or facial asymmetry.     Sensory: Sensation is intact. No sensory deficit.     Motor: Motor function is intact. No weakness.     Coordination: Coordination is intact. Coordination normal.     Gait: Gait is intact. Gait normal.     Comments: CN 2-12 intact. No weakness or numbness in UEs or LEs.  Psychiatric:        Attention and Perception: Attention and perception normal.        Mood and Affect: Affect normal. Mood is anxious.  Behavior: Behavior normal. Behavior is cooperative.        Thought Content: Thought content normal.        Judgment: Judgment normal.     UC Treatments / Results  Labs (all labs ordered are listed, but only abnormal results are displayed) Labs Reviewed  CBC - Abnormal; Notable for the following components:      Result  Value   Hemoglobin 10.4 (*)    HCT 34.0 (*)    MCH 24.8 (*)    All other components within normal limits  CBG MONITORING, ED - Abnormal; Notable for the following components:   Glucose-Capillary 113 (*)    All other components within normal limits  POCT RAPID STREP A, ED / UC - Abnormal; Notable for the following components:   Streptococcus, Group A Screen (Direct) POSITIVE (*)    All other components within normal limits  POCT URINALYSIS DIPSTICK, ED / UC - Abnormal; Notable for the following components:   pH 8.5 (*)    Protein, ur 30 (*)    All other components within normal limits  COMPREHENSIVE METABOLIC PANEL  POC URINE PREG, ED    EKG   Radiology No results found.  Procedures Procedures (including critical care time)  Medications Ordered in UC Medications - No data to display  Initial Impression / Assessment and Plan / UC Course  I have reviewed the triage vital signs and the nursing notes.  Pertinent labs & imaging results that were available during my care of the patient were reviewed by me and considered in my medical decision making (see chart for details).     This patient is a very pleasant 20 y.o. year old female presenting with resolving strep pharyngitis. She has also had episodes of lightheadedness following exertion over the last week that are improving on their own, but no syncope. This could be related to pharyngitis, or "energy pills" that she has been taking. She does not know what is in the energy pills, but states she hasn't taken them in 1 week and will cease taking them completely. Benign neuro exam today. Denies current headache. This patient did test positive for strep pharyngitis 6/29 and is currently on day 6 of course of amoxicillin.  EKG today NSR, no prior EKG for comparison. Nonfasting CBG wnl Will check a CBC and CMP UA wnl, urine pregnancy negative. Rapid strep is still positive. Complete course of amoxicillin.  Rec good hydration,  limit excessive prolonged standing and working while symptoms persist. STRICT ED return precautions discussed. Patient verbalizes understanding and agreement.   Coding this visit a Level 4 for review of past labs and notes (6/29), ordering and interpretation of labs today, prescription drug management. Pt with acute illness with systemic symptoms.   Final Clinical Impressions(s) / UC Diagnoses   Final diagnoses:  Strep pharyngitis  Near syncope  Routine lab draw  Tension headache     Discharge Instructions      -Finish course of amoxicillin as directed for your strep throat. -At the onset of a headache, Take Tylenol 1000 mg or ibuprofen 800 mg. You can take both up to 3 times daily with food.  You can take these together, or alternate every 3-4 hours. -Make sure to drink plenty of fluids or a beverage with electrolytes like Gatorade while you are working. -We have tested your urine, heart, and blood for electrolytes.  We will call you if anything comes back as abnormal. -Seek additional immediate medical attention if symptoms worsen  instead of improve, like shortness of breath, weakness, dizziness, loss of consciousness, worst headache of life. -Stop taking the energy pills.     ED Prescriptions   None    PDMP not reviewed this encounter.   Rhys Martini, PA-C 12/17/20 1301

## 2020-12-17 NOTE — ED Notes (Signed)
Patient is alert and oriented x 4 patient is bright-eyed, texting on phone in lobby.  Patient reports job sent her home yesterday and is requesting evaluation to return to work.  Patient denies injury from "passing out"

## 2020-12-17 NOTE — Discharge Instructions (Addendum)
-  Finish course of amoxicillin as directed for your strep throat. -At the onset of a headache, Take Tylenol 1000 mg or ibuprofen 800 mg. You can take both up to 3 times daily with food.  You can take these together, or alternate every 3-4 hours. -Make sure to drink plenty of fluids or a beverage with electrolytes like Gatorade while you are working. -We have tested your urine, heart, and blood for electrolytes.  We will call you if anything comes back as abnormal. -Seek additional immediate medical attention if symptoms worsen instead of improve, like shortness of breath, weakness, dizziness, loss of consciousness, worst headache of life. -Stop taking the energy pills.

## 2020-12-17 NOTE — ED Triage Notes (Signed)
Currently being treated for strep throat.  Patient reports she still feels weak.  Patient reports she passed out yesterday at work.  Yesterday had a headache at work.

## 2021-01-18 ENCOUNTER — Inpatient Hospital Stay (HOSPITAL_COMMUNITY)
Admission: AD | Admit: 2021-01-18 | Discharge: 2021-01-18 | Disposition: A | Discharge status: Home / Self Care | Payer: Medicaid Other | Attending: Obstetrics & Gynecology | Admitting: Obstetrics & Gynecology

## 2021-01-18 ENCOUNTER — Other Ambulatory Visit: Payer: Self-pay

## 2021-01-18 DIAGNOSIS — Z3200 Encounter for pregnancy test, result unknown: Secondary | ICD-10-CM | POA: Diagnosis not present

## 2021-01-18 NOTE — MAU Provider Note (Signed)
Event Date/Time   First Provider Initiated Contact with Patient 01/18/21 2020      S [name] is a 20 y.o. G1P1001 patient who presents to MAU today with complaint of possible pregnancy. She denies any pain or bleeding. She reports a positive pregnancy test at home   O BP 128/78 (BP Location: Right Arm)   Pulse 90   Temp 98.8 F (37.1 C) (Oral)   Resp 16   Ht 5\' 3"  (1.6 m)   Wt 58.2 kg   SpO2 100%   BMI 22.75 kg/m  Physical Exam Vitals and nursing note reviewed.  Constitutional:      General: She is not in acute distress.    Appearance: She is well-developed.  HENT:     Head: Normocephalic.  Eyes:     Pupils: Pupils are equal, round, and reactive to light.  Cardiovascular:     Rate and Rhythm: Normal rate and regular rhythm.     Heart sounds: Normal heart sounds.  Pulmonary:     Effort: Pulmonary effort is normal. No respiratory distress.     Breath sounds: Normal breath sounds.  Abdominal:     General: Bowel sounds are normal. There is no distension.     Palpations: Abdomen is soft.     Tenderness: There is no abdominal tenderness.  Skin:    General: Skin is warm and dry.  Neurological:     Mental Status: She is alert and oriented to person, place, and time.  Psychiatric:        Mood and Affect: Mood normal.        Behavior: Behavior normal.        Thought Content: Thought content normal.        Judgment: Judgment normal.    A Medical screening exam complete 1. Possible pregnancy     P Discharge from MAU in stable condition Patient given the option of transfer to [facility] for further evaluation or seek care in outpatient facility of choice  List of options for follow-up given  Warning signs for worsening condition that would warrant emergency follow-up discussed Patient may return to MAU as needed   [facility], Rolm Bookbinder 01/18/2021 8:20 PM

## 2021-01-18 NOTE — MAU Note (Signed)
Took an at home pregnancy test and it was faintly positive and she missed her last period. Denies vaginal bleeding or abdominal pain.  LMP: July 8th Pain score: 0/10

## 2021-01-19 ENCOUNTER — Ambulatory Visit (HOSPITAL_COMMUNITY)
Admission: EM | Admit: 2021-01-19 | Discharge: 2021-01-19 | Disposition: A | Discharge status: Home / Self Care | Payer: Medicaid Other | Attending: Medical Oncology | Admitting: Medical Oncology

## 2021-01-19 ENCOUNTER — Other Ambulatory Visit: Payer: Self-pay

## 2021-01-19 ENCOUNTER — Encounter (HOSPITAL_COMMUNITY): Payer: Self-pay

## 2021-01-19 DIAGNOSIS — Z3A01 Less than 8 weeks gestation of pregnancy: Secondary | ICD-10-CM

## 2021-01-19 DIAGNOSIS — O210 Mild hyperemesis gravidarum: Secondary | ICD-10-CM

## 2021-01-19 DIAGNOSIS — R112 Nausea with vomiting, unspecified: Secondary | ICD-10-CM | POA: Diagnosis not present

## 2021-01-19 DIAGNOSIS — Z3201 Encounter for pregnancy test, result positive: Secondary | ICD-10-CM

## 2021-01-19 LAB — POCT URINALYSIS DIPSTICK, ED / UC
Bilirubin Urine: NEGATIVE
Glucose, UA: NEGATIVE mg/dL
Hgb urine dipstick: NEGATIVE
Ketones, ur: NEGATIVE mg/dL
Leukocytes,Ua: NEGATIVE
Nitrite: NEGATIVE
Protein, ur: NEGATIVE mg/dL
Specific Gravity, Urine: 1.02 (ref 1.005–1.030)
Urobilinogen, UA: 0.2 mg/dL (ref 0.0–1.0)
pH: 6.5 (ref 5.0–8.0)

## 2021-01-19 LAB — POC URINE PREG, ED: Preg Test, Ur: POSITIVE — AB

## 2021-01-19 NOTE — ED Triage Notes (Signed)
Pt presents with vomiting X 2 days; Pt states period is 3 days late.

## 2021-01-19 NOTE — ED Provider Notes (Signed)
[facility]    CSN: [id number] Arrival date & time: 01/19/21  1003      History   Chief Complaint Chief Complaint  Patient presents with   Emesis   Possible Pregnancy    HPI Nivia Gervase is a 20 y.o. female.   HPI  Possible Pregnancy: Pt reports that she is 3 days late to her menstrual cycle. [facility] was at the beginning of July. She has also had some nausea and 1 episodes of vomiting. No pelvic pain, abdominal pain, vaginal bleeding. She has had one prior pregnancy. She is not currently taking a prenatal vitamin but will start.   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 Authorizing Provider  albuterol (VENTOLIN HFA) 108 (90 Base) MCG/ACT inhaler Inhale 1-2 puffs into the lungs every 6 (six) hours as needed for wheezing or shortness of breath. 12/11/20   Jayleana Colberg, Brand Males, PA-C  benzonatate (TESSALON) 100 MG capsule Take 1-2 capsules (100-200 mg total) by mouth 3 (three) times daily as needed for cough. 08/07/20   Wallis Bamberg, PA-C  cetirizine (ZYRTEC ALLERGY) 10 MG tablet Take 1 tablet (10 mg total) by mouth daily. 10/15/20   White, Elita Boone, NP  docusate sodium (COLACE) 100 MG capsule Take 1 capsule (100 mg total) by mouth daily. 07/23/20   Caccavale, Sophia, PA-C  ferrous sulfate 325 (65 FE) MG tablet Take 1 tablet (325 mg total) by mouth daily with breakfast. 07/08/20   [name], NP  fluticasone  (FLONASE) 50 MCG/ACT nasal spray Place 2 sprays into both nostrils daily. 12/11/20   Rushie Chestnut, PA-C  omeprazole (PRILOSEC) 20 MG capsule Take 1 capsule (20 mg total) by mouth daily. 07/10/20   Rushie Chestnut, PA-C  ondansetron (ZOFRAN) 4 MG tablet Take 1 tablet (4 mg total) by mouth every 6 (six) hours. 07/10/20   Rushie Chestnut, PA-C  polyethylene glycol powder [facility]) 17 GM/SCOOP powder Use 1-2 capfuls in 8 oz of liquid up to 3 times a day until you are having regular bowel movements 07/23/20   Caccavale, Sophia, PA-C  promethazine-dextromethorphan (PROMETHAZINE-DM) 6.25-15 MG/5ML syrup Take 5 mLs by mouth at bedtime as needed for cough. 08/07/20   Wallis Bamberg, PA-C    Family History Family History  Problem Relation Age of Onset   Heart disease Mother    Stroke Mother    Hypertension Maternal Grandmother    Diabetes Maternal Grandmother    Diabetes Maternal Grandfather    Diabetes Maternal Uncle    Diabetes Maternal Aunt     Social History  Social History   Tobacco Use   Smoking status: Never   Smokeless tobacco: Never  Vaping Use   Vaping Use: Never used  Substance Use Topics   Alcohol use: No   Drug use: No     Allergies   Bee venom, Metronidazole, and Wasp venom   Review of Systems Review of Systems  As stated above in HPI Physical Exam Triage Vital Signs ED Triage Vitals  Enc Vitals Group     BP 01/19/21 1034 126/81     Pulse Rate 01/19/21 1034 76     Resp --      Temp 01/19/21 1034 98.8 F (37.1 C)     Temp Source 01/19/21 1034 Oral     SpO2 01/19/21 1034 100 %     Weight --      Height --      Head Circumference --      Peak Flow --      Pain Score 01/19/21 1042 0     Pain Loc --      Pain Edu? --      Excl. in GC? --    No data found.  Updated Vital Signs BP 126/81 (BP Location: Right Arm)   Pulse 76   Temp 98.8 F (37.1 C) (Oral)   LMP 12/20/2020   SpO2 100%   Physical Exam Vitals and nursing note reviewed.   Constitutional:      General: She is not in acute distress.    Appearance: Normal appearance. She is not ill-appearing, toxic-appearing or diaphoretic.  Neurological:     Mental Status: She is alert.  Psychiatric:     Comments: Pt seemed to be in shock about her positive pregnancy results     UC Treatments / Results  Labs (all labs ordered are listed, but only abnormal results are displayed) Labs Reviewed  POCT URINALYSIS DIPSTICK, ED / UC  POC URINE PREG, ED    EKG   Radiology No results found.  Procedures Procedures (including critical care time)  Medications Ordered in UC Medications - No data to display  Initial Impression / Assessment and Plan / UC Course  I have reviewed the triage vital signs and the nursing notes.  Pertinent labs & imaging results that were available during my care of the patient were reviewed by me and considered in my medical decision making (see chart for details).     New.  Discussed her positive hCG results with patient.  She defers a first trimester pregnancy handout as this is not her first pregnancy but we did review the items.  I have encouraged her to start a prenatal vitamin.  We discussed natural ways to help with nausea such as B6 supplementation and ginger.  We discussed red flag signs and symptoms.  I have recommended that she set up an appointment with OB/GYN, [facility], or [facility] if desired as she expresses that she wants to find out exactly how far along she is as soon as possible.    Final Clinical Impressions(s) / UC Diagnoses   Final diagnoses:  None   Discharge Instructions   None    ED Prescriptions   None    PDMP not reviewed this encounter.   Rushie Chestnut, [place] 01/19/21 1103

## 2021-01-23 ENCOUNTER — Encounter (INDEPENDENT_AMBULATORY_CARE_PROVIDER_SITE_OTHER): Payer: Self-pay | Admitting: Family

## 2021-01-23 ENCOUNTER — Other Ambulatory Visit: Payer: Self-pay

## 2021-01-23 ENCOUNTER — Ambulatory Visit (INDEPENDENT_AMBULATORY_CARE_PROVIDER_SITE_OTHER): Payer: Medicaid Other | Admitting: Family

## 2021-01-23 ENCOUNTER — Telehealth: Payer: Self-pay | Admitting: Obstetrics and Gynecology

## 2021-01-23 ENCOUNTER — Other Ambulatory Visit (HOSPITAL_COMMUNITY)
Admission: RE | Admit: 2021-01-23 | Discharge: 2021-01-23 | Disposition: A | Discharge status: Home / Self Care | Payer: Medicaid Other | Source: Ambulatory Visit | Attending: Primary Care | Admitting: Primary Care

## 2021-01-23 VITALS — BP 101/68 | HR 88 | Temp 97.5°F | Ht 63.0 in | Wt 130.8 lb

## 2021-01-23 DIAGNOSIS — Z3A01 Less than 8 weeks gestation of pregnancy: Secondary | ICD-10-CM | POA: Diagnosis not present

## 2021-01-23 DIAGNOSIS — Z113 Encounter for screening for infections with a predominantly sexual mode of transmission: Secondary | ICD-10-CM | POA: Insufficient documentation

## 2021-01-23 NOTE — Telephone Encounter (Signed)
Attempted to reach patient to schedule for prenatal care. Left a detailed voice mail message for her to call the office.

## 2021-01-23 NOTE — Progress Notes (Signed)
Valerie Newman, is a 20 y.o. female  UJW:[id number]  FAO:[id number]  DOB - 01-12-2001  Subjective:  Chief Complaint and HPI: Valerie Newman is a 20 y.o. female here today for STD screening.  Patient says that she has been noticing white discharge from her vagina x2 days, denies dysuria, back pain, frequencies, polyuria, polydipsia, or polyphagia.  Patient says that on Sunday she was seen at the local urgent care and tested positive for pregnancy.  Denies any other symptoms.   ED/Hospital notes reviewed.    ROS:   Constitutional:  No f/c, No night sweats, No unexplained weight loss. Respiratory: No cough, No SOB Cardiac: No CP, no palpitations GI:  No abd pain, No N/V/D. GU: Discharge with whitish color for 2 days Neuro: No headache, no dizziness, no motor weakness.  Endocrine:  No polydipsia. No polyuria.  Psych: Denies SI/HI  No problems updated.  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 Authorizing Provider  albuterol (VENTOLIN HFA) 108 (90 Base) MCG/ACT inhaler Inhale 1-2 puffs into the lungs every 6 (six) hours as needed for wheezing or shortness of breath. Patient not taking: Reported on 01/23/2021 12/11/20   Rushie Chestnut, PA-C  ferrous sulfate 325 (65 FE) MG tablet Take 1 tablet (325 mg total) by mouth daily with breakfast. Patient not taking: Reported on 01/23/2021 07/08/20   [name], NP  fluticasone [facility]) 50 MCG/ACT nasal spray Place 2 sprays into both nostrils daily. Patient not taking: Reported on 01/23/2021 12/11/20   Rushie Chestnut, PA-C     Objective:  EXAM:   Vitals:   01/23/21 1017  BP: 101/68   Pulse: 88  Temp: (!) 97.5 F (36.4 C)  TempSrc: Temporal  SpO2: 100%  Weight: 130 lb 12.8 oz (59.3 kg)  Height: 5\' 3"  (1.6 m)    General appearance : A&OX3. NAD. Non-toxic-appearing Chest/Lungs:  Breathing-non-labored, Good air entry bilaterally, breath sounds normal without rales, rhonchi, or wheezing  CVS: S1 S2 regular, no murmurs, gallops, rubs  Abdomen: Bowel sounds present, Non tender and not distended with no gaurding, rigidity or rebound. Extremities: Bilateral Lower Ext shows no edema, both legs are warm to touch with = pulse throughout Neurology:  CN II-XII grossly intact, Non focal.   Psych:  TP linear. J/I WNL. Normal speech. Appropriate eye contact and affect.    Data Review No results found for: HGBA1C   Assessment & Plan   1. Screening for STDs (sexually transmitted diseases) -Practice safe sex, use protection - Cervicovaginal ancillary only  2. Less than [redacted] weeks gestation of pregnancy -Schedule an appointment with your OB/GYN doctor - HIV antibody (with reflex) - Hepatitis C Antibody    Patient have been counseled extensively about nutrition and exercise  Return in about 4 weeks (around 02/20/2021) for OB/GYN for pregnancy care within the next 4 weeks.  The patient was given clear instructions to go to ER or return to medical center if symptoms don't improve, worsen or new problems develop. The patient verbalized understanding. The patient was told to call to get lab results  if they haven't heard anything in the next week.     Eleonore Chiquito, APRN, FNP-C [facility] and [facility] [place], [place] [phone]   01/23/2021, 10:39 AM

## 2021-01-23 NOTE — Patient Instructions (Signed)
Follow-up with your OB/GYN doctor Stay hydrated Practice sex safe sex Reports new or worsening symptoms to the clinic or local ER

## 2021-01-24 ENCOUNTER — Inpatient Hospital Stay (HOSPITAL_COMMUNITY): Payer: Medicaid Other

## 2021-01-24 ENCOUNTER — Inpatient Hospital Stay (HOSPITAL_COMMUNITY)
Admission: AD | Admit: 2021-01-24 | Discharge: 2021-01-24 | Disposition: A | Discharge status: Home / Self Care | Payer: Medicaid Other | Attending: Obstetrics & Gynecology | Admitting: Obstetrics & Gynecology

## 2021-01-24 ENCOUNTER — Other Ambulatory Visit: Payer: Self-pay

## 2021-01-24 ENCOUNTER — Encounter (HOSPITAL_COMMUNITY): Payer: Self-pay | Admitting: Obstetrics & Gynecology

## 2021-01-24 DIAGNOSIS — Z3A01 Less than 8 weeks gestation of pregnancy: Secondary | ICD-10-CM | POA: Diagnosis not present

## 2021-01-24 DIAGNOSIS — O3680X0 Pregnancy with inconclusive fetal viability, not applicable or unspecified: Secondary | ICD-10-CM

## 2021-01-24 DIAGNOSIS — O26899 Other specified pregnancy related conditions, unspecified trimester: Secondary | ICD-10-CM

## 2021-01-24 DIAGNOSIS — Z91030 Bee allergy status: Secondary | ICD-10-CM | POA: Insufficient documentation

## 2021-01-24 DIAGNOSIS — Z881 Allergy status to other antibiotic agents status: Secondary | ICD-10-CM | POA: Diagnosis not present

## 2021-01-24 DIAGNOSIS — R109 Unspecified abdominal pain: Secondary | ICD-10-CM

## 2021-01-24 DIAGNOSIS — O26891 Other specified pregnancy related conditions, first trimester: Secondary | ICD-10-CM | POA: Diagnosis present

## 2021-01-24 LAB — COMPREHENSIVE METABOLIC PANEL
ALT: 11 U/L (ref 0–44)
AST: 21 U/L (ref 15–41)
Albumin: 3.8 g/dL (ref 3.5–5.0)
Alkaline Phosphatase: 52 U/L (ref 38–126)
Anion gap: 6 (ref 5–15)
BUN: 9 mg/dL (ref 6–20)
CO2: 24 mmol/L (ref 22–32)
Calcium: 9 mg/dL (ref 8.9–10.3)
Chloride: 102 mmol/L (ref 98–111)
Creatinine, Ser: 0.74 mg/dL (ref 0.44–1.00)
GFR, Estimated: >60 mL/min (ref >60)
Glucose, Bld: 99 mg/dL (ref 70–99)
Potassium: 3.1 mmol/L — ABNORMAL LOW (ref 3.5–5.1)
Sodium: 132 mmol/L — ABNORMAL LOW (ref 135–145)
Total Bilirubin: 0.6 mg/dL (ref 0.3–1.2)
Total Protein: 7.2 g/dL (ref 6.5–8.1)

## 2021-01-24 LAB — CBC WITH DIFFERENTIAL/PLATELET
Abs Immature Granulocytes: 0.02 10*3/uL (ref 0.00–0.07)
Basophils Absolute: 0 10*3/uL (ref 0.0–0.1)
Basophils Relative: 0 %
Eosinophils Absolute: 0.3 10*3/uL (ref 0.0–0.5)
Eosinophils Relative: 4 %
HCT: 33 % — ABNORMAL LOW (ref 36.0–46.0)
Hemoglobin: 10.5 g/dL — ABNORMAL LOW (ref 12.0–15.0)
Immature Granulocytes: 0 %
Lymphocytes Relative: 28 %
Lymphs Abs: 2.2 10*3/uL (ref 0.7–4.0)
MCH: 25.7 pg — ABNORMAL LOW (ref 26.0–34.0)
MCHC: 31.8 g/dL (ref 30.0–36.0)
MCV: 80.7 fL (ref 80.0–100.0)
Monocytes Absolute: 0.4 10*3/uL (ref 0.1–1.0)
Monocytes Relative: 5 %
Neutro Abs: 4.8 10*3/uL (ref 1.7–7.7)
Neutrophils Relative %: 63 %
Platelets: 275 10*3/uL (ref 150–400)
RBC: 4.09 MIL/uL (ref 3.87–5.11)
RDW: 15.3 % (ref 11.5–15.5)
WBC: 7.7 10*3/uL (ref 4.0–10.5)
nRBC: 0 % (ref 0.0–0.2)

## 2021-01-24 LAB — CERVICOVAGINAL ANCILLARY ONLY
Bacterial Vaginitis (gardnerella): NEGATIVE
Candida Glabrata: POSITIVE — AB
Candida Vaginitis: POSITIVE — AB
Chlamydia: NEGATIVE
Comment: NEGATIVE
Comment: NEGATIVE
Comment: NEGATIVE
Comment: NEGATIVE
Comment: NEGATIVE
Comment: NORMAL
Neisseria Gonorrhea: NEGATIVE
Trichomonas: NEGATIVE

## 2021-01-24 LAB — URINALYSIS, ROUTINE W REFLEX MICROSCOPIC
Bilirubin Urine: NEGATIVE
Glucose, UA: NEGATIVE mg/dL
Hgb urine dipstick: NEGATIVE
Ketones, ur: 5 mg/dL — AB
Leukocytes,Ua: NEGATIVE
Nitrite: NEGATIVE
Protein, ur: NEGATIVE mg/dL
Specific Gravity, Urine: 1.021 (ref 1.005–1.030)
pH: 5 (ref 5.0–8.0)

## 2021-01-24 LAB — HCG, QUANTITATIVE, PREGNANCY: hCG, Beta Chain, Quant, S: 3367 m[IU]/mL — ABNORMAL HIGH (ref <5)

## 2021-01-24 LAB — WET PREP, GENITAL
Clue Cells Wet Prep HPF POC: NONE SEEN
Sperm: NONE SEEN
Trich, Wet Prep: NONE SEEN
Yeast Wet Prep HPF POC: NONE SEEN

## 2021-01-24 LAB — HEPATITIS C ANTIBODY: Hep C Virus Ab: <0.1 s/co ratio (ref 0.0–0.9)

## 2021-01-24 LAB — HIV ANTIBODY (ROUTINE TESTING W REFLEX): HIV Screen 4th Generation wRfx: NONREACTIVE

## 2021-01-24 NOTE — MAU Note (Signed)
Pt reports lower abd pain that comes and goes for the last few days. Denies bleeding. Positive preg test at urgent care last weeks.

## 2021-01-24 NOTE — MAU Provider Note (Signed)
History     CSN: [id number]  Arrival date and time: 01/24/21 2059   None     Chief Complaint  Patient presents with   Abdominal Pain   Valerie Newman is a 20 y.o. G2P1001 at 5 weeks by Definite LMP.  She presents today for Abdominal Pain.  She states that the pain "comes and goes" and has been present for "a few weeks."  Patient describes the pain as cramping and reports it lasts "a couple minutes before going away."  Patient also reports the pain radiates to her back and causes additional cramping.  Patient states she has not tried any medication for the pain.      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   Allergy to pollen    Anemia    Anxiety    Asthma     Past Surgical History:  Procedure Laterality Date   TOOTH EXTRACTION      Family History  Problem Relation Age of Onset   Heart disease Mother    Stroke Mother    Hypertension Maternal Grandmother    Diabetes Maternal Grandmother    Diabetes Maternal Grandfather    Diabetes Maternal Uncle    Diabetes Maternal Aunt     Social History   Tobacco Use   Smoking status: Never   Smokeless tobacco: Never  Vaping Use   Vaping Use: Never used  Substance Use Topics   Alcohol use: No   Drug use: No    Allergies:  Allergies  Allergen Reactions   Bee Venom Swelling   Metronidazole Nausea And Vomiting   Wasp Venom Swelling    Medications Prior to Admission  Medication Sig Dispense Refill Last Dose   albuterol (VENTOLIN HFA) 108 (90 Base) MCG/ACT inhaler Inhale 1-2 puffs into the lungs every 6 (six) hours as needed for wheezing or shortness of breath. (Patient not taking: Reported on 01/23/2021) 18 g 0    ferrous sulfate 325 (65 FE) MG tablet Take 1 tablet (325 mg total) by mouth daily with  breakfast. (Patient not taking: Reported on 01/23/2021) 90 tablet 3    fluticasone (FLONASE) 50 MCG/ACT nasal spray Place 2 sprays into both nostrils daily. (Patient not taking: Reported on 01/23/2021) 16 mL 0     Review of Systems  Gastrointestinal:  Positive for abdominal pain (Cramps). Negative for constipation and diarrhea.  Genitourinary:  Negative for difficulty urinating, dysuria, vaginal bleeding and vaginal discharge.  Physical Exam   Blood pressure 117/76, pulse 80, temperature 98 F (36.7 C), resp. rate 15, height 5\' 3"  (1.6 m), weight 60.8 kg, last menstrual period 12/20/2020, SpO2 100 %.  Physical Exam Vitals reviewed.  Constitutional:      Appearance: She is well-developed.  HENT:     Head: Normocephalic and atraumatic.  Eyes:     Conjunctiva/sclera: Conjunctivae normal.  Cardiovascular:     Rate and Rhythm: Normal rate.  Pulmonary:     Effort: Pulmonary effort is normal. No respiratory distress.  Musculoskeletal:  General: Normal range of motion.     Cervical back: Normal range of motion.  Skin:    General: Skin is warm and dry.  Neurological:     Mental Status: She is alert.  Psychiatric:        Mood and Affect: Mood normal.        Behavior: Behavior normal.        Thought Content: Thought content normal.    MAU Course  Procedures Results for orders placed or performed during the hospital encounter of 01/24/21 (from the past 48 hour(s))  CBC with Differential/Platelet     Status: Abnormal   Collection Time: 01/24/21  9:43 PM  Result Value Ref Range   WBC 7.7 4.0 - 10.5 K/uL   RBC 4.09 3.87 - 5.11 MIL/uL   Hemoglobin 10.5 (L) 12.0 - 15.0 g/dL   HCT 36.1 (L) 44.3 - 15.4 %   MCV 80.7 80.0 - 100.0 fL   MCH 25.7 (L) 26.0 - 34.0 pg   MCHC 31.8 30.0 - 36.0 g/dL   RDW 00.8 67.6 - 19.5 %   Platelets 275 150 - 400 K/uL   nRBC 0.0 0.0 - 0.2 %   Neutrophils Relative % 63 %   Neutro Abs 4.8 1.7 - 7.7 K/uL   Lymphocytes Relative 28 %   Lymphs Abs 2.2 0.7  - 4.0 K/uL   Monocytes Relative 5 %   Monocytes Absolute 0.4 0.1 - 1.0 K/uL   Eosinophils Relative 4 %   Eosinophils Absolute 0.3 0.0 - 0.5 K/uL   Basophils Relative 0 %   Basophils Absolute 0.0 0.0 - 0.1 K/uL   Immature Granulocytes 0 %   Abs Immature Granulocytes 0.02 0.00 - 0.07 K/uL    Comment: Performed at [facility], [address], [place], [place] [address]  Comprehensive metabolic panel     Status: Abnormal   Collection Time: 01/24/21  9:43 PM  Result Value Ref Range   Sodium 132 (L) 135 - 145 mmol/L   Potassium 3.1 (L) 3.5 - 5.1 mmol/L   Chloride 102 98 - 111 mmol/L   CO2 24 22 - 32 mmol/L   Glucose, Bld 99 70 - 99 mg/dL    Comment: Glucose reference range applies only to samples taken after fasting for at least 8 hours.   BUN 9 6 - 20 mg/dL   Creatinine, Ser 7.12 0.44 - 1.00 mg/dL   Calcium 9.0 8.9 - 45.8 mg/dL   Total Protein 7.2 6.5 - 8.1 g/dL   Albumin 3.8 3.5 - 5.0 g/dL   AST 21 15 - 41 U/L   ALT 11 0 - 44 U/L   Alkaline Phosphatase 52 38 - 126 U/L   Total Bilirubin 0.6 0.3 - 1.2 mg/dL   GFR, Estimated >09 >98 mL/min    Comment: (NOTE) Calculated using the CKD-EPI Creatinine Equation (2021)    Anion gap 6 5 - 15    Comment: Performed at [facility], [address] [place], [place] [address]  hCG, quantitative, pregnancy     Status: Abnormal   Collection Time: 01/24/21  9:43 PM  Result Value Ref Range   hCG, Beta Chain, Quant, S 3,367 (H) <5 mIU/mL    Comment:          GEST. AGE      CONC.  (mIU/mL)   <=1 WEEK        5 - 50     2 WEEKS  50 - 500     3 WEEKS       100 - 10,000     4 WEEKS     1,000 - 30,000     5 WEEKS     3,500 - 115,000   6-8 WEEKS     12,000 - 270,000    12 WEEKS     15,000 - 220,000        FEMALE AND NON-PREGNANT FEMALE:     LESS THAN 5 mIU/mL Performed at [facility], [address], [place], [place] [address]   Wet prep, genital     Status: Abnormal   Collection Time: 01/24/21 10:00 PM   Specimen:  Vaginal  Result Value Ref Range   Yeast Wet Prep HPF POC NONE SEEN NONE SEEN   Trich, Wet Prep NONE SEEN NONE SEEN   Clue Cells Wet Prep HPF POC NONE SEEN NONE SEEN   WBC, Wet Prep HPF POC MANY (A) NONE SEEN   Sperm NONE SEEN     Comment: Performed at [facility], [address] [place], [place] [address]  Urinalysis, Routine w reflex microscopic Urine, Clean Catch     Status: Abnormal   Collection Time: 01/24/21 10:00 PM  Result Value Ref Range   Color, Urine YELLOW YELLOW   APPearance HAZY (A) CLEAR   Specific Gravity, Urine 1.021 1.005 - 1.030   pH 5.0 5.0 - 8.0   Glucose, UA NEGATIVE NEGATIVE mg/dL   Hgb urine dipstick NEGATIVE NEGATIVE   Bilirubin Urine NEGATIVE NEGATIVE   Ketones, ur 5 (A) NEGATIVE mg/dL   Protein, ur NEGATIVE NEGATIVE mg/dL   Nitrite NEGATIVE NEGATIVE   Leukocytes,Ua NEGATIVE NEGATIVE    Comment: Performed at [facility], [address] [place], [place] [address]   US OB LESS THAN 14 WEEKS WITH OB TRANSVAGINAL  Result Date: 01/24/2021 CLINICAL DATA:  Pelvic pain common known positive pregnancy test EXAM: OBSTETRIC <14 WK [place] AND TRANSVAGINAL OB US TECHNIQUE: Both transabdominal and transvaginal ultrasound examinations were performed for complete evaluation of the gestation as well as the maternal uterus, adnexal regions, and pelvic cul-de-sac. Transvaginal technique was performed to assess early pregnancy. COMPARISON:  None. FINDINGS: Intrauterine gestational sac: Tiny gestational sac is noted corresponding to a 4 week 6 day gestation. MSD: 2.7 mm   4 w   6 d Subchorionic hemorrhage:  None visualized. Maternal uterus/adnexae: Uterus is within normal limits. Ovaries appear unremarkable. IMPRESSION: Tiny intrauterine gestational sac at 4 weeks 6 days. Follow-up can be performed as clinically indicated. Electronically Signed   By: Alcide Clever M.D.   On: 01/24/2021 22:20     MDM Wet Prep and GC/CT Labs: UA, UPT, CBC/D, hCG Ultrasound Assessment and  Plan  20 year old G2P1001 at 5 weeks Abdominal Cramping  -POC reviewed and implemented while patient in triage. -Results return as above and provider to bedside to discuss. -Informed of negative wet prep and GC/CT Pending.  -Discussed IUGS, but no identified yolk sac or embyro. -Informed of need for follow up in 48 hours. -Plan to report to [facility] at 0830 on Monday Aug 15 for repeat hCG.  -Encouraged to drink more water throughout the day. -Bleeding Precautions Given. -Encouraged to call or return to MAU if symptoms worsen or with the onset of new symptoms. -Discharged to home in stable condition.  Cherre Robins, MSN, CNM 01/24/2021, 11:32 PM

## 2021-01-27 ENCOUNTER — Other Ambulatory Visit: Payer: Self-pay

## 2021-01-27 ENCOUNTER — Other Ambulatory Visit (INDEPENDENT_AMBULATORY_CARE_PROVIDER_SITE_OTHER): Payer: Medicaid Other | Admitting: *Deleted

## 2021-01-27 VITALS — BP 108/72 | HR 78 | Temp 98.8°F | Ht 63.0 in | Wt 131.0 lb

## 2021-01-27 DIAGNOSIS — O3680X0 Pregnancy with inconclusive fetal viability, not applicable or unspecified: Secondary | ICD-10-CM

## 2021-01-27 LAB — GC/CHLAMYDIA PROBE AMP (~~LOC~~) NOT AT ARMC
Chlamydia: NEGATIVE
Comment: NEGATIVE
Comment: NORMAL
Neisseria Gonorrhea: NEGATIVE

## 2021-01-27 LAB — BETA HCG QUANT (REF LAB): hCG Quant: 6722 m[IU]/mL

## 2021-01-27 NOTE — Addendum Note (Signed)
Addended by: Cherre Robins on: 01/27/2021 06:57 PM   Modules accepted: Orders

## 2021-01-27 NOTE — Progress Notes (Signed)
   Valerie Newman presents to [facility] for follow-up quant hCG blood draw today. She was seen in MAU for  abdominal pain on 01/24/2021. Patient denies abdominal pain or bleeding today. Discussed with patient, we are following hCG levels today. Results will be back in approximately 2 hours. Valid contact number for patient confirmed. I will call the patient with results.   Beta Hcg on 01/24/2021: 3,367.   Clovis Pu 01/27/2021 10:07 AM

## 2021-01-28 ENCOUNTER — Other Ambulatory Visit: Payer: Self-pay | Admitting: Family

## 2021-01-28 DIAGNOSIS — B373 Candidiasis of vulva and vagina: Secondary | ICD-10-CM

## 2021-01-28 DIAGNOSIS — B3731 Acute candidiasis of vulva and vagina: Secondary | ICD-10-CM

## 2021-01-28 MED ORDER — FLUCONAZOLE 150 MG PO TABS: 150.0000 mg | ORAL_TABLET | Freq: Once | ORAL | 0 refills | Status: AC

## 2021-01-30 ENCOUNTER — Other Ambulatory Visit: Payer: Self-pay

## 2021-01-30 ENCOUNTER — Ambulatory Visit
Admission: RE | Admit: 2021-01-30 | Discharge: 2021-01-30 | Disposition: A | Discharge status: Home / Self Care | Payer: Medicaid Other | Source: Ambulatory Visit

## 2021-01-30 ENCOUNTER — Ambulatory Visit (INDEPENDENT_AMBULATORY_CARE_PROVIDER_SITE_OTHER): Payer: Medicaid Other

## 2021-01-30 DIAGNOSIS — O3680X0 Pregnancy with inconclusive fetal viability, not applicable or unspecified: Secondary | ICD-10-CM | POA: Insufficient documentation

## 2021-01-30 NOTE — Progress Notes (Signed)
Pt here today for follow up US per ER visit on 01/24/21. Pt denies any pain, vaginal bleeding or discharge.  Reviewed results with Dr Donavan Foil. Pt advised the results are for early pregnancy and needs repeat scan in 10-14 days for viability. Pt verbalized understanding.   [place] scheduled on 08/29@ 8am. Pt aware and agreeable to plan of care.   Judeth Cornfield, RN

## 2021-01-31 NOTE — Progress Notes (Signed)
Patient was assessed and managed by nursing staff during this encounter. I have reviewed the chart and agree with the documentation and plan. I have also made any necessary editorial changes.  Warden Fillers, MD 01/31/2021 3:22 PM

## 2021-02-09 ENCOUNTER — Other Ambulatory Visit: Payer: Self-pay

## 2021-02-09 ENCOUNTER — Inpatient Hospital Stay (HOSPITAL_COMMUNITY)
Admission: AD | Admit: 2021-02-09 | Discharge: 2021-02-09 | Disposition: A | Discharge status: Home / Self Care | Payer: Medicaid Other | Attending: Obstetrics & Gynecology | Admitting: Obstetrics & Gynecology

## 2021-02-09 DIAGNOSIS — O219 Vomiting of pregnancy, unspecified: Secondary | ICD-10-CM | POA: Diagnosis not present

## 2021-02-09 DIAGNOSIS — Z881 Allergy status to other antibiotic agents status: Secondary | ICD-10-CM | POA: Insufficient documentation

## 2021-02-09 DIAGNOSIS — Z3A01 Less than 8 weeks gestation of pregnancy: Secondary | ICD-10-CM | POA: Diagnosis not present

## 2021-02-09 MED ORDER — PROMETHAZINE HCL 12.5 MG PO TABS: 12.5000 mg | ORAL_TABLET | Freq: Every evening | ORAL | 0 refills | Status: DC | PRN

## 2021-02-09 MED ORDER — METOCLOPRAMIDE HCL 10 MG PO TABS: 10.0000 mg | ORAL_TABLET | Freq: Three times a day (TID) | ORAL | 2 refills | Status: DC | PRN

## 2021-02-09 NOTE — MAU Note (Signed)
..  Valerie Newman is a 20 y.o. at [redacted]w[redacted]d here in MAU reporting: Everything she eats makes her feel sick but she has not vomited. She went to urgent care today and was given nausea medicine there but patient has not taken it.  Pain score: 0/10 Vitals:   02/09/21 1940  BP: 126/66  Pulse: 69  Resp: 16  Temp: 98 F (36.7 C)  SpO2: 100%

## 2021-02-09 NOTE — MAU Provider Note (Signed)
Chief Complaint: Nausea   None     SUBJECTIVE HPI: Valerie Newman is a 20 y.o. G2P1001 at [redacted]w[redacted]d by LMP who presents to maternity admissions reporting nausea all day long x 3-4 days without vomiting. She went to an urgent care today and was prescribed a nausea medication but was not sure if she should take it so she came to MAU.  She denies any pain or bleeding. She has a viability [place] scheduled tomorrow, 02/10/21, at [facility].   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   Allergy to pollen    Anemia    Anxiety    Asthma    Past Surgical History:  Procedure Laterality Date   TOOTH EXTRACTION     Social History   Socioeconomic History   Marital status: Single    Spouse name: Not on file   Number of children: Not on file   Years of education: Not on file   Highest education level: Not on file  Occupational History   Not on file  Tobacco Use   Smoking status: Never   Smokeless tobacco: Never  Vaping Use   Vaping Use: Never used  Substance and Sexual Activity   Alcohol use: No   Drug use: No   Sexual activity: Not Currently    Birth control/protection: Condom  Other Topics Concern   Not on file  Social History Narrative   Not on file   Social Determinants of Health   Financial Resource Strain: Not on file  Food Insecurity: Not on file  Transportation Needs: Not on file  Physical Activity: Not on file  Stress: Not on file  Social Connections: Not on file  Intimate Partner Violence: Not on file   No current facility-administered medications on file prior to encounter.   No current outpatient medications on file prior to encounter.   Allergies  Allergen Reactions   Bee Venom Swelling   Metronidazole Nausea And Vomiting   Wasp Venom Swelling    ROS:  Review of Systems   I have reviewed patient's Past Medical Hx, Surgical Hx, Family Hx, Social Hx, medications and allergies.   Physical Exam  Patient Vitals for the  past 24 hrs:  BP Temp Temp src Pulse Resp SpO2 Height Weight  02/09/21 1940 126/66 98 F (36.7 C) Oral 69 16 100 % 5\' 3"  (1.6 m) 60.7 kg   Constitutional: Well-developed, well-nourished female in no acute distress.  Cardiovascular: normal rate Respiratory: normal effort GI: Abd soft, non-tender. Pos BS x 4 MS: Extremities nontender, no edema, normal ROM Neurologic: Alert and oriented x 4.  GU: Neg CVAT.  PELVIC EXAM: Deferred    LAB RESULTS No results found for this or any previous visit (from the past 24 hour(s)).     IMAGING OB Transvaginal  Result Date: 01/30/2021 CLINICAL DATA:  Pregnancy.  Assess viability. EXAM: TRANSVAGINAL OB ULTRASOUND TECHNIQUE: Transvaginal ultrasound was performed for complete evaluation of the gestation as well as the maternal uterus, adnexal regions, and pelvic cul-de-sac. COMPARISON:  01/24/2021 FINDINGS: Intrauterine gestational sac: Single Yolk sac:  Visualized. Embryo:  Visualized. Cardiac Activity: Not Visualized. CRL:   2.3 mm   5 w 5 d                  03/26/2021 EDC: 09/27/2021 Subchorionic hemorrhage:  None visualized. Maternal uterus/adnexae: Left ovarian corpus luteal cyst. Ovaries otherwise unremarkable. No free fluid within the pelvis.  IMPRESSION: Intrauterine pregnancy containing embryo measuring 2.3 mm by crown-rump length. No cardiac activity is visualized at this time. Findings are suspicious but not yet definitive for failed pregnancy. Recommend follow-up US in 10-14 days for definitive diagnosis. This recommendation follows SRU consensus guidelines: Diagnostic Criteria for Nonviable Pregnancy Early in the First Trimester. Malva Limes Med 2013; 967:8938-10. Electronically Signed   By: Duanne Guess D.O.   On: 01/30/2021 11:00   US OB LESS THAN 14 WEEKS WITH OB TRANSVAGINAL  Result Date: 01/24/2021 CLINICAL DATA:  Pelvic pain common known positive pregnancy test EXAM: OBSTETRIC <14 WK [place] AND TRANSVAGINAL OB US TECHNIQUE: Both transabdominal and  transvaginal ultrasound examinations were performed for complete evaluation of the gestation as well as the maternal uterus, adnexal regions, and pelvic cul-de-sac. Transvaginal technique was performed to assess early pregnancy. COMPARISON:  None. FINDINGS: Intrauterine gestational sac: Tiny gestational sac is noted corresponding to a 4 week 6 day gestation. MSD: 2.7 mm   4 w   6 d Subchorionic hemorrhage:  None visualized. Maternal uterus/adnexae: Uterus is within normal limits. Ovaries appear unremarkable. IMPRESSION: Tiny intrauterine gestational sac at 4 weeks 6 days. Follow-up can be performed as clinically indicated. Electronically Signed   By: Alcide Clever M.D.   On: 01/24/2021 22:20    MAU Management/MDM: Orders Placed This Encounter  Procedures   Discharge patient    Meds ordered this encounter  Medications   DISCONTD: metoCLOPramide (REGLAN) 10 MG tablet    Sig: Take 1 tablet (10 mg total) by mouth 3 (three) times daily with meals as needed for nausea.    Dispense:  30 tablet    Refill:  2    Order Specific Question:   Supervising Provider    Answer:   Myna Hidalgo [[id number]]   DISCONTD: promethazine (PHENERGAN) 12.5 MG tablet    Sig: Take 1-2 tablets (12.5-25 mg total) by mouth at bedtime as needed for nausea or vomiting.    Dispense:  30 tablet    Refill:  0    Order Specific Question:   Supervising Provider    Answer:   Myna Hidalgo [[id number]]   metoCLOPramide (REGLAN) 10 MG tablet    Sig: Take 1 tablet (10 mg total) by mouth 3 (three) times daily with meals as needed for nausea.    Dispense:  30 tablet    Refill:  2    Order Specific Question:   Supervising Provider    Answer:   Myna Hidalgo [[id number]]   promethazine (PHENERGAN) 12.5 MG tablet    Sig: Take 1-2 tablets (12.5-25 mg total) by mouth at bedtime as needed for nausea or vomiting.    Dispense:  30 tablet    Refill:  0    Order Specific Question:   Supervising Provider    Answer:   Myna Hidalgo [[id number]]     Pt stable, with no vomiting and pt tolerating PO fluids/food, no evidence of dehydration.  Rx for Reglan for daytime use PRN and Phenergan for PRN use QHS.  Keep viability [place] tomorrow morning.  Pt discharged with strict return precautions.  ASSESSMENT 1. Nausea and vomiting during pregnancy prior to [redacted] weeks gestation   2. [redacted] weeks gestation of pregnancy     PLAN Discharge home Allergies as of 02/09/2021       Reactions   Bee Venom Swelling   Metronidazole Nausea And Vomiting   Wasp Venom Swelling        Medication List     TAKE  these medications    metoCLOPramide 10 MG tablet Commonly known as: Reglan Take 1 tablet (10 mg total) by mouth 3 (three) times daily with meals as needed for nausea.   promethazine 12.5 MG tablet Commonly known as: PHENERGAN Take 1-2 tablets (12.5-25 mg total) by mouth at bedtime as needed for nausea or vomiting.        Follow-up Information     [facility] Follow up.   Specialty: Obstetrics and Gynecology Why: As scheduled 02/10/21 at 8:00 am for your ultrasound. Contact information: [address] [place] [address] [phone]        [facility] Follow up.   Specialty: Obstetrics and Gynecology Why: As needed for emergencies Contact information: [address] [id number] Wilhemina Bonito [place] [address] [phone]                Sharen Counter Certified Nurse-Midwife 02/09/2021  8:54 PM

## 2021-02-10 ENCOUNTER — Telehealth: Payer: Self-pay

## 2021-02-10 ENCOUNTER — Ambulatory Visit
Admission: RE | Admit: 2021-02-10 | Discharge: 2021-02-10 | Disposition: A | Discharge status: Home / Self Care | Payer: Medicaid Other | Source: Ambulatory Visit | Attending: Obstetrics and Gynecology | Admitting: Obstetrics and Gynecology

## 2021-02-10 DIAGNOSIS — O3680X0 Pregnancy with inconclusive fetal viability, not applicable or unspecified: Secondary | ICD-10-CM | POA: Diagnosis present

## 2021-02-10 MED ORDER — ONDANSETRON 8 MG PO TBDP: 8.0000 mg | ORAL_TABLET | Freq: Three times a day (TID) | ORAL | 2 refills | Status: DC | PRN

## 2021-02-10 NOTE — Telephone Encounter (Signed)
Called patient to review ultrasound results. Patient verbalized understanding of results and requesting a different nausea medication. RX sent to pharmacy.   Patient is requesting note for work stating she can have frequent water breaks. Note placed in chart and patient will come pick up at office.   Rolm Bookbinder, CNM 02/10/21 3:00 PM

## 2021-02-10 NOTE — Addendum Note (Signed)
Addended by: Rolm Bookbinder on: 02/10/2021 03:04 PM   Modules accepted: Orders

## 2021-02-20 ENCOUNTER — Inpatient Hospital Stay (HOSPITAL_COMMUNITY)
Admission: AD | Admit: 2021-02-20 | Discharge: 2021-02-21 | Disposition: A | Discharge status: Home / Self Care | Payer: Medicaid Other | Attending: Obstetrics and Gynecology | Admitting: Obstetrics and Gynecology

## 2021-02-20 DIAGNOSIS — O26891 Other specified pregnancy related conditions, first trimester: Secondary | ICD-10-CM | POA: Diagnosis present

## 2021-02-20 DIAGNOSIS — Z3A09 9 weeks gestation of pregnancy: Secondary | ICD-10-CM | POA: Diagnosis not present

## 2021-02-20 DIAGNOSIS — R102 Pelvic and perineal pain: Secondary | ICD-10-CM | POA: Diagnosis not present

## 2021-02-20 DIAGNOSIS — O26899 Other specified pregnancy related conditions, unspecified trimester: Secondary | ICD-10-CM

## 2021-02-20 NOTE — MAU Note (Signed)
I have been cramping for days. Took Tylenol yesterday and then went to sleep so don't know if it helped or not. Feels like menstrual cramps. Denies VB or d/c.

## 2021-02-21 DIAGNOSIS — O26891 Other specified pregnancy related conditions, first trimester: Secondary | ICD-10-CM

## 2021-02-21 DIAGNOSIS — Z3A09 9 weeks gestation of pregnancy: Secondary | ICD-10-CM

## 2021-02-21 DIAGNOSIS — R102 Pelvic and perineal pain: Secondary | ICD-10-CM

## 2021-02-21 LAB — URINALYSIS, ROUTINE W REFLEX MICROSCOPIC
Bilirubin Urine: NEGATIVE
Glucose, UA: NEGATIVE mg/dL
Hgb urine dipstick: NEGATIVE
Ketones, ur: NEGATIVE mg/dL
Leukocytes,Ua: NEGATIVE
Nitrite: NEGATIVE
Protein, ur: NEGATIVE mg/dL
Specific Gravity, Urine: >1.03 — ABNORMAL HIGH (ref 1.005–1.030)
pH: 6 (ref 5.0–8.0)

## 2021-02-21 NOTE — MAU Provider Note (Signed)
Chief Complaint: Abdominal Pain   Event Date/Time   First Provider Initiated Contact with Patient 02/21/21 0013        SUBJECTIVE HPI: Valerie Newman is a 20 y.o. G2P1001 at [redacted]w[redacted]d by LMP who presents to maternity admissions reporting pelvic cramping for 3 days   Took Tylenol and went to sleep  . She denies vaginal bleeding, vaginal itching/burning, urinary symptoms, h/a, dizziness, n/v, or fever/chills.    Abdominal Pain This is a new problem. The current episode started in the past 7 days. The problem occurs intermittently. The problem is unchanged. The quality of the pain is described as cramping. The pain does not radiate. Pertinent negatives include no constipation, diarrhea, dysuria, fever, frequency, headaches or myalgias. Nothing relieves the symptoms. Past treatments include acetaminophen. The treatment provided mild relief.  RN Note: I have been cramping for days. Took Tylenol yesterday and then went to sleep so don't know if it helped or not. Feels like menstrual cramps. Denies VB or d/c.  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   Allergy to pollen    Anemia    Anxiety    Asthma    Past Surgical History:  Procedure Laterality Date   TOOTH EXTRACTION     Social History   Socioeconomic History   Marital status: Single    Spouse name: Not on file   Number of children: Not on file   Years of education: Not on file   Highest education level: Not on file  Occupational History   Not on file  Tobacco Use   Smoking status: Never   Smokeless tobacco: Never  Vaping Use   Vaping Use: Never used  Substance and Sexual Activity   Alcohol use: No   Drug use: No   Sexual activity: Not Currently    Birth control/protection: Condom  Other Topics Concern   Not on file  Social History Narrative   Not on file   Social Determinants of Health   Financial Resource Strain: Not on file  Food Insecurity: Not on file  Transportation Needs: Not  on file  Physical Activity: Not on file  Stress: Not on file  Social Connections: Not on file  Intimate Partner Violence: Not on file   No current facility-administered medications on file prior to encounter.   Current Outpatient Medications on File Prior to Encounter  Medication Sig Dispense Refill   metoCLOPramide (REGLAN) 10 MG tablet Take 1 tablet (10 mg total) by mouth 3 (three) times daily with meals as needed for nausea. 30 tablet 2   ondansetron (ZOFRAN ODT) 8 MG disintegrating tablet Take 1 tablet (8 mg total) by mouth every 8 (eight) hours as needed for nausea or vomiting. 30 tablet 2   promethazine (PHENERGAN) 12.5 MG tablet Take 1-2 tablets (12.5-25 mg total) by mouth at bedtime as needed for nausea or vomiting. 30 tablet 0   Allergies  Allergen Reactions   Bee Venom Swelling   Metronidazole Nausea And Vomiting   Wasp Venom Swelling    I have reviewed patient's Past Medical Hx, Surgical Hx, Family Hx, Social Hx, medications and allergies.   ROS:  Review of Systems  Constitutional:  Negative for fever.  Gastrointestinal:  Positive for abdominal pain. Negative for constipation and diarrhea.  Genitourinary:  Negative for dysuria and frequency.  Musculoskeletal:  Negative for myalgias.  Neurological:  Negative for headaches.  Review of Systems  Other systems negative   Physical  Exam  Physical Exam Patient Vitals for the past 24 hrs:  BP Temp Pulse Resp SpO2 Height Weight  02/20/21 2232 115/67 98 F (36.7 C) 79 16 100 % 5\' 3"  (1.6 m) 60.8 kg   Constitutional: Well-developed, well-nourished female in no acute distress.  Cardiovascular: normal rate Respiratory: normal effort GI: Abd soft, non-tender. Pos BS x 4 MS: Extremities nontender, no edema, normal ROM Neurologic: Alert and oriented x 4.  GU: Neg CVAT.  PELVIC EXAM: Cervix pink, visually closed, without lesion, scant white creamy discharge, vaginal walls and external genitalia normal  LAB RESULTS Results  for orders placed or performed during the hospital encounter of 02/20/21 (from the past 24 hour(s))  Urinalysis, Routine w reflex microscopic Urine, Clean Catch     Status: Abnormal   Collection Time: 02/20/21 10:27 PM  Result Value Ref Range   Color, Urine YELLOW YELLOW   APPearance CLEAR CLEAR   Specific Gravity, Urine >1.030 (H) 1.005 - 1.030   pH 6.0 5.0 - 8.0   Glucose, UA NEGATIVE NEGATIVE mg/dL   Hgb urine dipstick NEGATIVE NEGATIVE   Bilirubin Urine NEGATIVE NEGATIVE   Ketones, ur NEGATIVE NEGATIVE mg/dL   Protein, ur NEGATIVE NEGATIVE mg/dL   Nitrite NEGATIVE NEGATIVE   Leukocytes,Ua NEGATIVE NEGATIVE     IMAGING Pt informed that the ultrasound is considered a limited OB ultrasound and is not intended to be a complete ultrasound exam.  Patient also informed that the ultrasound is not being completed with the intent of assessing for fetal or placental anomalies or any pelvic abnormalities.  Explained that the purpose of today's ultrasound is to assess for presentation, BPP and amniotic fluid volume.  Patient acknowledges the purpose of the exam and the limitations of the study.    SIngle IUP  visible Normal GS Active fetus with HR 150s  MAU Management/MDM: Ordered UA which was clear Cervix closed 04/22/21 showed active fetus.   Treatments in MAU included Bedside US.   ASSESSMENT Single IUP at [redacted]w[redacted]d Pelvic cramping  PLAN Discharge home Reassured baby is growing well  and cramping is not changing cervix Has New OB appt at [facility] Pt stable at time of discharge. Encouraged to return here if she develops worsening of symptoms, increase in pain, fever, or other concerning symptoms.    [redacted]w[redacted]d CNM, MSN Certified Nurse-Midwife 02/21/2021  12:13 AM

## 2021-02-21 NOTE — MAU Note (Signed)
Bedside U/S performed by M.Williams,CNM. Cardiac activity confirmed . SVE closed/Thick/Long.awaiting UA results

## 2021-03-11 ENCOUNTER — Ambulatory Visit (INDEPENDENT_AMBULATORY_CARE_PROVIDER_SITE_OTHER): Payer: Medicaid Other | Admitting: *Deleted

## 2021-03-11 ENCOUNTER — Other Ambulatory Visit: Payer: Self-pay

## 2021-03-11 VITALS — BP 104/69 | HR 87 | Temp 97.5°F | Ht 63.0 in | Wt 135.8 lb

## 2021-03-11 DIAGNOSIS — R300 Dysuria: Secondary | ICD-10-CM

## 2021-03-11 DIAGNOSIS — R350 Frequency of micturition: Secondary | ICD-10-CM | POA: Diagnosis not present

## 2021-03-11 DIAGNOSIS — O26899 Other specified pregnancy related conditions, unspecified trimester: Secondary | ICD-10-CM | POA: Diagnosis not present

## 2021-03-11 DIAGNOSIS — Z3480 Encounter for supervision of other normal pregnancy, unspecified trimester: Secondary | ICD-10-CM | POA: Insufficient documentation

## 2021-03-11 LAB — POCT URINALYSIS DIPSTICK OB
Bilirubin, UA: NEGATIVE
Blood, UA: NEGATIVE
Glucose, UA: NEGATIVE
Ketones, UA: NEGATIVE
Leukocytes, UA: NEGATIVE
Nitrite, UA: NEGATIVE
POC,PROTEIN,UA: NEGATIVE
Spec Grav, UA: >=1.03 — AB (ref 1.010–1.025)
Urobilinogen, UA: 0.2 E.U./dL
pH, UA: 7.5 (ref 5.0–8.0)

## 2021-03-11 MED ORDER — GOJJI WEIGHT SCALE MISC: 1.0000 | Freq: Every day | 0 refills | Status: DC | PRN

## 2021-03-11 MED ORDER — BLOOD PRESSURE MONITOR AUTOMAT DEVI: 1.0000 | Freq: Every day | 0 refills | Status: DC

## 2021-03-11 MED ORDER — PRENATAL PLUS VITAMIN/MINERAL 27-1 MG PO TABS: 1.0000 | ORAL_TABLET | Freq: Every day | ORAL | 12 refills | Status: DC

## 2021-03-11 NOTE — Progress Notes (Signed)
   Location: [facility] Patient: clinic Provider: clinic  PRENATAL INTAKE SUMMARY  Ms. Moger presents today New OB Nurse Interview.  OB History     Gravida  2   Para  1   Term  1   Preterm  0   AB  0   Living  1      SAB  0   IAB  0   Ectopic  0   Multiple  0   Live Births  1          I have reviewed the patient's medical, obstetrical, social, and family histories, medications, and available lab results.  SUBJECTIVE She complains of dysuria and and urinary frequency for about 2 weeks; without flank pain, fever, chills, or abnormal vaginal discharge or bleeding.   Patient also reported decrease appetite to meat.   OBJECTIVE Initial Nurse interview for history/labs (New OB)  Urine dipstick shows all components normal other than SG: >=1.030.    EDD: 09/26/2021 by LMP GA: [redacted]w[redacted]d G2P1001 FHT: 168  GENERAL APPEARANCE: alert, well appearing, in no apparent distress, oriented to person, place and time   ASSESSMENT Normal pregnancy Dysuria Urinary Frequency  PLAN Prenatal care:  [facility] Pnl/HIV/Hep C OB Urine Culture/Dip (Treatment per orders) GC/CT/PAP at next visit with Raelyn Mora, CNM HgbEval/SMA/CF ([facility]) [facility]ama A1C Rx [facility] for BP monitor and weight scale Sign up for Babyscripts Rx PNV Ultrasound >14 week complete ordered for anatomy scan Advised to supplement with High Protein shakes due to decrease in meat composition.    Follow Up Instructions:   I discussed the assessment and treatment plan with the patient. The patient was provided an opportunity to ask questions and all were answered. The patient agreed with the plan and demonstrated an understanding of the instructions.   The patient was advised to call back or seek an in-person evaluation if the symptoms worsen or if the condition fails to improve as anticipated.  I provided 35 minutes of  face-to-face time during this encounter.    Clovis Pu, RN

## 2021-03-11 NOTE — Patient Instructions (Signed)
   Genetic Screening Results Information: You are having genetic testing called [facility] today.  It will take approximately 2 weeks before the results are available.  To get your results, you need Internet access to a web browser to search [facility]/MyChart (the direct app on your phone will not give you these results).  Then select Lab Scanned and click on the blue hyper link that says View Image to see your Panorama results.  You can also use the directions on the purple card given to look up your results directly on the [facility] website.  

## 2021-03-12 ENCOUNTER — Ambulatory Visit (HOSPITAL_COMMUNITY)
Admission: EM | Admit: 2021-03-12 | Discharge: 2021-03-12 | Disposition: A | Discharge status: Home / Self Care | Payer: Medicaid Other | Attending: Family Medicine | Admitting: Family Medicine

## 2021-03-12 ENCOUNTER — Other Ambulatory Visit: Payer: Self-pay | Admitting: Family Medicine

## 2021-03-12 ENCOUNTER — Encounter (HOSPITAL_COMMUNITY): Payer: Self-pay | Admitting: Emergency Medicine

## 2021-03-12 ENCOUNTER — Other Ambulatory Visit: Payer: Self-pay

## 2021-03-12 DIAGNOSIS — J302 Other seasonal allergic rhinitis: Secondary | ICD-10-CM | POA: Diagnosis not present

## 2021-03-12 DIAGNOSIS — R059 Cough, unspecified: Secondary | ICD-10-CM | POA: Insufficient documentation

## 2021-03-12 DIAGNOSIS — Z79899 Other long term (current) drug therapy: Secondary | ICD-10-CM | POA: Diagnosis not present

## 2021-03-12 DIAGNOSIS — J069 Acute upper respiratory infection, unspecified: Secondary | ICD-10-CM | POA: Diagnosis present

## 2021-03-12 DIAGNOSIS — Z3A12 12 weeks gestation of pregnancy: Secondary | ICD-10-CM | POA: Insufficient documentation

## 2021-03-12 DIAGNOSIS — Z20822 Contact with and (suspected) exposure to covid-19: Secondary | ICD-10-CM | POA: Insufficient documentation

## 2021-03-12 DIAGNOSIS — Z881 Allergy status to other antibiotic agents status: Secondary | ICD-10-CM | POA: Insufficient documentation

## 2021-03-12 DIAGNOSIS — Z9109 Other allergy status, other than to drugs and biological substances: Secondary | ICD-10-CM | POA: Diagnosis not present

## 2021-03-12 DIAGNOSIS — O99511 Diseases of the respiratory system complicating pregnancy, first trimester: Secondary | ICD-10-CM | POA: Diagnosis not present

## 2021-03-12 LAB — OBSTETRIC PANEL, INCLUDING HIV
Antibody Screen: NEGATIVE
Basophils Absolute: 0 10*3/uL (ref 0.0–0.2)
Basos: 0 %
EOS (ABSOLUTE): 0.4 10*3/uL (ref 0.0–0.4)
Eos: 6 %
HIV Screen 4th Generation wRfx: NONREACTIVE
Hematocrit: 32 % — ABNORMAL LOW (ref 34.0–46.6)
Hemoglobin: 10.2 g/dL — ABNORMAL LOW (ref 11.1–15.9)
Hepatitis B Surface Ag: NEGATIVE
Immature Grans (Abs): 0 10*3/uL (ref 0.0–0.1)
Immature Granulocytes: 0 %
Lymphocytes Absolute: 1.2 10*3/uL (ref 0.7–3.1)
Lymphs: 19 %
MCH: 25.2 pg — ABNORMAL LOW (ref 26.6–33.0)
MCHC: 31.9 g/dL (ref 31.5–35.7)
MCV: 79 fL (ref 79–97)
Monocytes Absolute: 0.4 10*3/uL (ref 0.1–0.9)
Monocytes: 6 %
Neutrophils Absolute: 4.4 10*3/uL (ref 1.4–7.0)
Neutrophils: 69 %
Platelets: 222 10*3/uL (ref 150–450)
RBC: 4.04 x10E6/uL (ref 3.77–5.28)
RDW: 13.5 % (ref 11.7–15.4)
RPR Ser Ql: NONREACTIVE
Rh Factor: POSITIVE
Rubella Antibodies, IGG: 9.22 index (ref >0.99)
WBC: 6.4 10*3/uL (ref 3.4–10.8)

## 2021-03-12 LAB — HEMOGLOBIN A1C
Est. average glucose Bld gHb Est-mCnc: 103 mg/dL
Hgb A1c MFr Bld: 5.2 % (ref 4.8–5.6)

## 2021-03-12 LAB — HEPATITIS C ANTIBODY: Hep C Virus Ab: <0.1 s/co ratio (ref 0.0–0.9)

## 2021-03-12 LAB — SARS CORONAVIRUS 2 (TAT 6-24 HRS): SARS Coronavirus 2: NEGATIVE

## 2021-03-12 MED ORDER — PSEUDOEPHEDRINE HCL 30 MG PO TABS: 30.0000 mg | ORAL_TABLET | Freq: Three times a day (TID) | ORAL | 0 refills | Status: DC | PRN

## 2021-03-12 MED ORDER — FLUTICASONE PROPIONATE 50 MCG/ACT NA SUSP: 1.0000 | Freq: Two times a day (BID) | NASAL | 0 refills | Status: DC

## 2021-03-12 NOTE — ED Triage Notes (Signed)
Pt c/o cough and runny nose x 1 week. 

## 2021-03-12 NOTE — ED Provider Notes (Signed)
[facility]    CSN: [id number] Arrival date & time: 03/12/21  0815      History   Chief Complaint Chief Complaint  Patient presents with   Cough   Nasal Congestion    HPI Valerie Newman is a 20 y.o. female.   Patient presenting today with several day history of cough, sore throat, runny nose.  Denies chest pain, shortness of breath, fevers, chills, body aches, abdominal pain, nausea vomiting or diarrhea.  So far trying hot tea and cough drops with temporary relief.  Daughter recently sick with similar symptoms but unsure with what.  She is currently about 2 and half months pregnant.  History of seasonal allergies and asthma not currently requiring any inhalers.   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 Authorizing Provider  fluticasone (FLONASE) 50 MCG/ACT nasal spray Place 1 spray into both nostrils 2 (two) times daily. 03/12/21  Yes Particia Nearing, PA-C  metoCLOPramide (REGLAN) 10 MG tablet Take 1 tablet (10 mg total) by mouth 3 (three) times daily with meals as needed for nausea. 02/09/21  Yes Leftwich-Kirby, Wilmer Floor, CNM  Prenatal Vit-Fe Fumarate-FA (PRENATAL PLUS VITAMIN/MINERAL) 27-1 MG TABS Take 1 tablet by mouth daily at 6 (six) AM. 03/11/21  Yes Raelyn Mora, CNM  pseudoephedrine (SUDAFED) 30 MG tablet Take  1 tablet (30 mg total) by mouth every 8 (eight) hours as needed for congestion. 03/12/21  Yes Particia Nearing, PA-C  Blood Pressure Monitoring (BLOOD PRESSURE MONITOR AUTOMAT) DEVI 1 Device by Does not apply route daily. Automatic blood pressure cuff regular size. To monitor blood pressure regularly at home. ICD-10 code:Z34.90 03/11/21   Raelyn Mora, CNM  diphenhydrAMINE (BENADRYL) 25 MG tablet Take 25 mg by mouth every 6 (six) hours as needed.    [provider]  Misc. Devices (GOJJI WEIGHT SCALE) MISC 1 Device by Does not apply route daily as needed. To weight self daily as needed at home. ICD-10 code: Z34.90 03/11/21   Raelyn Mora, CNM    Family History Family History  Problem Relation Age of Onset   Heart disease Mother    Stroke Mother    Hypertension Maternal Grandmother    Diabetes Maternal Grandmother    Diabetes Maternal Grandfather    Diabetes Maternal Uncle    Diabetes Maternal Aunt     Social History Social History   Tobacco Use   Smoking status: Never    Passive exposure: Never   Smokeless tobacco:  Never  Vaping Use   Vaping Use: Never used  Substance Use Topics   Alcohol use: No   Drug use: No     Allergies   Bee venom, Metronidazole, and Wasp venom   Review of Systems Review of Systems Per HPI  Physical Exam Triage Vital Signs ED Triage Vitals  Enc Vitals Group     BP 03/12/21 0845 107/68     Pulse Rate 03/12/21 0845 88     Resp --      Temp 03/12/21 0845 98.3 F (36.8 C)     Temp Source 03/12/21 0845 Oral     SpO2 03/12/21 0845 97 %     Weight --      Height --      Head Circumference --      Peak Flow --      Pain Score 03/12/21 0847 0     Pain Loc --      Pain Edu? --      Excl. in GC? --    No data found.  Updated Vital Signs BP 107/68 (BP Location: Right Arm)   Pulse 88   Temp 98.3 F (36.8 C) (Oral)   LMP 12/20/2020   SpO2 97%   Visual Acuity Right Eye Distance:   Left Eye Distance:   Bilateral  Distance:    Right Eye Near:   Left Eye Near:    Bilateral Near:     Physical Exam Vitals and nursing note reviewed.  Constitutional:      Appearance: Normal appearance. She is not ill-appearing.  HENT:     Head: Atraumatic.     Nose: Rhinorrhea present.     Comments: Significant edema and discharge present in bilateral nares Eyes:     Extraocular Movements: Extraocular movements intact.     Conjunctiva/sclera: Conjunctivae normal.  Cardiovascular:     Rate and Rhythm: Normal rate and regular rhythm.     Heart sounds: Normal heart sounds.  Pulmonary:     Effort: Pulmonary effort is normal.     Breath sounds: Normal breath sounds. No wheezing or rales.  Musculoskeletal:        General: Normal range of motion.     Cervical back: Normal range of motion and neck supple.  Skin:    General: Skin is warm and dry.  Neurological:     Mental Status: She is alert and oriented to person, place, and time.     Motor: No weakness.     Gait: Gait normal.  Psychiatric:        Mood and Affect: Mood normal.        Thought Content: Thought content normal.        Judgment: Judgment normal.   UC Treatments / Results  Labs (all labs ordered are listed, but only abnormal results are displayed) Labs Reviewed  SARS CORONAVIRUS 2 (TAT 6-24 HRS)   EKG  Radiology No results found.  Procedures Procedures (including critical care time)  Medications Ordered in UC Medications - No data to display  Initial Impression / Assessment and Plan / UC Course  I have reviewed the triage vital signs and the nursing notes.  Pertinent labs & imaging results that were available during my care of the patient were reviewed by me and considered in my medical decision making (see chart for details).     Vitals and exam findings overall reassuring today, suspect viral illness causing symptoms.  COVID PCR pending.  Quarantine protocol reviewed.  Return  precautions given at length.  Flonase, Sudafed sent for  symptomatic benefit in the meantime.  Final Clinical Impressions(s) / UC Diagnoses   Final diagnoses:  Viral URI with cough   Discharge Instructions   None    ED Prescriptions     Medication Sig Dispense Auth. Provider   fluticasone (FLONASE) 50 MCG/ACT nasal spray Place 1 spray into both nostrils 2 (two) times daily. 16 g Particia Nearing, [place]   pseudoephedrine (SUDAFED) 30 MG tablet Take 1 tablet (30 mg total) by mouth every 8 (eight) hours as needed for congestion. 10 tablet Particia Nearing, [place]      PDMP not reviewed this encounter.   Roosvelt Maser [place], [place] 03/12/21 [phone]

## 2021-03-13 ENCOUNTER — Encounter: Payer: Self-pay | Admitting: Obstetrics and Gynecology

## 2021-03-13 ENCOUNTER — Encounter: Payer: Medicaid Other | Admitting: Obstetrics and Gynecology

## 2021-03-13 DIAGNOSIS — Z3480 Encounter for supervision of other normal pregnancy, unspecified trimester: Secondary | ICD-10-CM

## 2021-03-14 LAB — URINE CULTURE, OB REFLEX

## 2021-03-14 LAB — CULTURE, OB URINE

## 2021-03-20 ENCOUNTER — Encounter (HOSPITAL_COMMUNITY): Payer: Self-pay | Admitting: Family Medicine

## 2021-03-20 ENCOUNTER — Inpatient Hospital Stay (HOSPITAL_COMMUNITY): Payer: Medicaid Other

## 2021-03-20 ENCOUNTER — Other Ambulatory Visit: Payer: Self-pay | Admitting: Obstetrics and Gynecology

## 2021-03-20 ENCOUNTER — Inpatient Hospital Stay (HOSPITAL_COMMUNITY)
Admission: AD | Admit: 2021-03-20 | Discharge: 2021-03-20 | Disposition: A | Discharge status: Home / Self Care | Payer: Medicaid Other | Attending: Family Medicine | Admitting: Family Medicine

## 2021-03-20 DIAGNOSIS — Z3A12 12 weeks gestation of pregnancy: Secondary | ICD-10-CM | POA: Diagnosis not present

## 2021-03-20 DIAGNOSIS — R109 Unspecified abdominal pain: Secondary | ICD-10-CM | POA: Diagnosis not present

## 2021-03-20 DIAGNOSIS — O99341 Other mental disorders complicating pregnancy, first trimester: Secondary | ICD-10-CM

## 2021-03-20 DIAGNOSIS — R102 Pelvic and perineal pain: Secondary | ICD-10-CM | POA: Insufficient documentation

## 2021-03-20 DIAGNOSIS — O26891 Other specified pregnancy related conditions, first trimester: Secondary | ICD-10-CM | POA: Diagnosis not present

## 2021-03-20 DIAGNOSIS — O26899 Other specified pregnancy related conditions, unspecified trimester: Secondary | ICD-10-CM

## 2021-03-20 DIAGNOSIS — F419 Anxiety disorder, unspecified: Secondary | ICD-10-CM | POA: Diagnosis not present

## 2021-03-20 DIAGNOSIS — D649 Anemia, unspecified: Secondary | ICD-10-CM

## 2021-03-20 LAB — URINALYSIS, ROUTINE W REFLEX MICROSCOPIC
Bilirubin Urine: NEGATIVE
Glucose, UA: NEGATIVE mg/dL
Hgb urine dipstick: NEGATIVE
Ketones, ur: NEGATIVE mg/dL
Leukocytes,Ua: NEGATIVE
Nitrite: NEGATIVE
Protein, ur: NEGATIVE mg/dL
Specific Gravity, Urine: 1.02 (ref 1.005–1.030)
pH: 7 (ref 5.0–8.0)

## 2021-03-20 LAB — COMPREHENSIVE METABOLIC PANEL
ALT: 10 U/L (ref 0–44)
AST: 17 U/L (ref 15–41)
Albumin: 3.1 g/dL — ABNORMAL LOW (ref 3.5–5.0)
Alkaline Phosphatase: 43 U/L (ref 38–126)
Anion gap: 6 (ref 5–15)
BUN: 7 mg/dL (ref 6–20)
CO2: 24 mmol/L (ref 22–32)
Calcium: 9.2 mg/dL (ref 8.9–10.3)
Chloride: 105 mmol/L (ref 98–111)
Creatinine, Ser: 0.51 mg/dL (ref 0.44–1.00)
GFR, Estimated: >60 mL/min (ref >60)
Glucose, Bld: 90 mg/dL (ref 70–99)
Potassium: 3.7 mmol/L (ref 3.5–5.1)
Sodium: 135 mmol/L (ref 135–145)
Total Bilirubin: 0.4 mg/dL (ref 0.3–1.2)
Total Protein: 6.7 g/dL (ref 6.5–8.1)

## 2021-03-20 LAB — WET PREP, GENITAL
Clue Cells Wet Prep HPF POC: NONE SEEN
Sperm: NONE SEEN
Trich, Wet Prep: NONE SEEN
Yeast Wet Prep HPF POC: NONE SEEN

## 2021-03-20 LAB — CBC WITH DIFFERENTIAL/PLATELET
Abs Immature Granulocytes: 0.03 10*3/uL (ref 0.00–0.07)
Basophils Absolute: 0 10*3/uL (ref 0.0–0.1)
Basophils Relative: 0 %
Eosinophils Absolute: 0.4 10*3/uL (ref 0.0–0.5)
Eosinophils Relative: 6 %
HCT: 28.6 % — ABNORMAL LOW (ref 36.0–46.0)
Hemoglobin: 9.3 g/dL — ABNORMAL LOW (ref 12.0–15.0)
Immature Granulocytes: 0 %
Lymphocytes Relative: 19 %
Lymphs Abs: 1.4 10*3/uL (ref 0.7–4.0)
MCH: 26.1 pg (ref 26.0–34.0)
MCHC: 32.5 g/dL (ref 30.0–36.0)
MCV: 80.3 fL (ref 80.0–100.0)
Monocytes Absolute: 0.6 10*3/uL (ref 0.1–1.0)
Monocytes Relative: 7 %
Neutro Abs: 5.1 10*3/uL (ref 1.7–7.7)
Neutrophils Relative %: 68 %
Platelets: 227 10*3/uL (ref 150–400)
RBC: 3.56 MIL/uL — ABNORMAL LOW (ref 3.87–5.11)
RDW: 14.4 % (ref 11.5–15.5)
WBC: 7.5 10*3/uL (ref 4.0–10.5)
nRBC: 0 % (ref 0.0–0.2)

## 2021-03-20 MED ORDER — IBUPROFEN 600 MG PO TABS
600.0000 mg | ORAL_TABLET | Freq: Once | ORAL | Status: AC
  Administered 2021-03-20: 600 mg via ORAL
  Filled 2021-03-20: qty 1

## 2021-03-20 MED ORDER — IRON 325 (65 FE) MG PO TABS: 1.0000 | ORAL_TABLET | ORAL | 0 refills | Status: DC

## 2021-03-20 MED ORDER — DOCUSATE SODIUM 100 MG PO CAPS: 100.0000 mg | ORAL_CAPSULE | Freq: Every day | ORAL | 2 refills | Status: DC | PRN

## 2021-03-20 MED ORDER — HYDROXYZINE PAMOATE 25 MG PO CAPS: 25.0000 mg | ORAL_CAPSULE | Freq: Three times a day (TID) | ORAL | 0 refills | Status: DC | PRN

## 2021-03-20 NOTE — Social Work (Signed)
EDCSW was called to consult. CSW met with Pt at bedside. Pt reports that she has one daughter and states that she has a positive relationship with daughter's father. Pt reports stressed relationship with family of origin and stressors with this pregnancy and the FOB not wishing to be involved with this pregnancy, as well as current unemployment. Pt has many strengths such as completing high school diploma after giving birth to first child and also completing a year long life skills program.  Pt states that she would be amenable to receiving services from [facility].

## 2021-03-20 NOTE — MAU Provider Note (Signed)
History     CSN: [id number]  Arrival date and time: 03/20/21 1347   Event Date/Time   First Provider Initiated Contact with Patient 03/20/21 1434      Chief Complaint  Patient presents with   Pelvic Pain   HPI  Ms.Valerie Newman is a 20 y.o. female G2P1001 @ [redacted]w[redacted]d  here in MAU with complaints of pelvic pain. The pain is located all across her lower abdomen. She last Felt this pain on Sept 26th. The pain is sharp and it comes and goes.The pain feels like sharp stomach cramps. She has a normal BM every other day.  The pain started again today and she feels it is worse this time. She has not tried anything for the pain. She recalls having pain similar to this after her last baby. She has no bleeding. She has been to MAU with this pain in the past.    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   Allergy to pollen    Anemia    Anxiety    Asthma     Past Surgical History:  Procedure Laterality Date   NO PAST SURGERIES     TOOTH EXTRACTION      Family History  Problem Relation Age of Onset   Heart disease Mother    Stroke Mother    Hypertension Maternal Grandmother    Diabetes Maternal Grandmother    Diabetes Maternal Grandfather    Diabetes Maternal Uncle    Diabetes Maternal Aunt     Social History   Tobacco Use   Smoking status: Never    Passive exposure: Never   Smokeless tobacco: Never  Vaping Use   Vaping Use: Never used  Substance Use Topics   Alcohol use: No   Drug use: No    Allergies:  Allergies  Allergen Reactions   Bee Venom Swelling   Metronidazole Nausea And Vomiting   Wasp Venom Swelling    Medications Prior to Admission  Medication Sig Dispense Refill Last Dose   Blood Pressure Monitoring (BLOOD PRESSURE MONITOR AUTOMAT) DEVI  1 Device by Does not apply route daily. Automatic blood pressure cuff regular size. To monitor blood pressure regularly at home. ICD-10 code:Z34.90 1 each 0 Unknown   diphenhydrAMINE (BENADRYL) 25 MG tablet Take 25 mg by mouth every 6 (six) hours as needed.   Unknown   fluticasone (FLONASE) 50 MCG/ACT nasal spray Place 1 spray into both nostrils 2 (two) times daily. 16 g 0 Unknown   metoCLOPramide (REGLAN) 10 MG tablet Take 1 tablet (10 mg total) by mouth 3 (three) times daily with meals as needed for nausea. 30 tablet 2 Unknown   Misc. Devices (GOJJI WEIGHT SCALE) MISC 1 Device by Does not apply route daily as needed. To weight self daily as needed at home. ICD-10 code: Z34.90 1 each 0 Unknown   Prenatal Vit-Fe Fumarate-FA (PRENATAL PLUS VITAMIN/MINERAL) 27-1 MG TABS Take 1 tablet by mouth daily at 6 (six) AM. 30 tablet 12 Unknown   pseudoephedrine (SUDAFED) 30  MG tablet Take 1 tablet (30 mg total) by mouth every 8 (eight) hours as needed for congestion. 10 tablet 0 Unknown   Results for orders placed or performed during the hospital encounter of 03/20/21 (from the past 48 hour(s))  Urinalysis, Routine w reflex microscopic Urine, Clean Catch     Status: Abnormal   Collection Time: 03/20/21  2:38 PM  Result Value Ref Range   Color, Urine YELLOW YELLOW   APPearance HAZY (A) CLEAR   Specific Gravity, Urine 1.020 1.005 - 1.030   pH 7.0 5.0 - 8.0   Glucose, UA NEGATIVE NEGATIVE mg/dL   Hgb urine dipstick NEGATIVE NEGATIVE   Bilirubin Urine NEGATIVE NEGATIVE   Ketones, ur NEGATIVE NEGATIVE mg/dL   Protein, ur NEGATIVE NEGATIVE mg/dL   Nitrite NEGATIVE NEGATIVE   Leukocytes,Ua NEGATIVE NEGATIVE    Comment: Performed at [facility], [address], [place], [place] [address]  Wet prep, genital     Status: Abnormal   Collection Time: 03/20/21  2:48 PM  Result Value Ref Range   Yeast Wet Prep HPF POC NONE SEEN NONE SEEN   Trich, Wet Prep NONE SEEN NONE SEEN   Clue Cells Wet Prep HPF POC  NONE SEEN NONE SEEN   WBC, Wet Prep HPF POC FEW (A) NONE SEEN   Sperm NONE SEEN     Comment: Performed at [facility], [address] [place], [place] [address]  CBC with Differential/Platelet     Status: Abnormal   Collection Time: 03/20/21  3:11 PM  Result Value Ref Range   WBC 7.5 4.0 - 10.5 K/uL   RBC 3.56 (L) 3.87 - 5.11 MIL/uL   Hemoglobin 9.3 (L) 12.0 - 15.0 g/dL   HCT 90.3 (L) 00.9 - 23.3 %   MCV 80.3 80.0 - 100.0 fL   MCH 26.1 26.0 - 34.0 pg   MCHC 32.5 30.0 - 36.0 g/dL   RDW 00.7 62.2 - 63.3 %   Platelets 227 150 - 400 K/uL   nRBC 0.0 0.0 - 0.2 %   Neutrophils Relative % 68 %   Neutro Abs 5.1 1.7 - 7.7 K/uL   Lymphocytes Relative 19 %   Lymphs Abs 1.4 0.7 - 4.0 K/uL   Monocytes Relative 7 %   Monocytes Absolute 0.6 0.1 - 1.0 K/uL   Eosinophils Relative 6 %   Eosinophils Absolute 0.4 0.0 - 0.5 K/uL   Basophils Relative 0 %   Basophils Absolute 0.0 0.0 - 0.1 K/uL   Immature Granulocytes 0 %   Abs Immature Granulocytes 0.03 0.00 - 0.07 K/uL    Comment: Performed at [facility], [address] [place], [place] [address]  Comprehensive metabolic panel     Status: Abnormal   Collection Time: 03/20/21  3:11 PM  Result Value Ref Range   Sodium 135 135 - 145 mmol/L   Potassium 3.7 3.5 - 5.1 mmol/L   Chloride 105 98 - 111 mmol/L   CO2 24 22 - 32 mmol/L   Glucose, Bld 90 70 - 99 mg/dL    Comment: Glucose reference range applies only to samples taken after fasting for at least 8 hours.   BUN 7 6 - 20 mg/dL   Creatinine, Ser 2.56 0.44 - 1.00 mg/dL   Calcium 9.2 8.9 - 38.9 mg/dL   Total Protein 6.7 6.5 - 8.1 g/dL   Albumin 3.1 (L) 3.5 - 5.0 g/dL   AST 17 15 - 41 U/L   ALT  10 0 - 44 U/L   Alkaline Phosphatase 43 38 - 126 U/L   Total Bilirubin 0.4 0.3 - 1.2 mg/dL   GFR, Estimated >43 >88 mL/min    Comment: (NOTE) Calculated using the CKD-EPI Creatinine Equation (2021)    Anion gap 6 5 - 15    Comment: Performed at [facility], [address], [place], [place] [address]    [place] Comp Less 14 Wks  Result Date: 03/20/2021 CLINICAL DATA:  Acute pelvic pain. EXAM: OBSTETRIC <14 WK ULTRASOUND TECHNIQUE: Transabdominal ultrasound was performed for evaluation of the gestation as well as the maternal uterus and adnexal regions. COMPARISON:  February 10, 2021. FINDINGS: Intrauterine gestational sac: Single Yolk sac:  Not Visualized. Embryo:  Visualized. Cardiac Activity: Visualized. Heart Rate: 166 bpm CRL:   69.4 mm   13 w 1 d                  [place] EDC: September 24, 2021. Subchorionic hemorrhage:  None visualized. Maternal uterus/adnexae: Ovaries are unremarkable. No free fluid is noted. IMPRESSION: Single live intrauterine gestation of 13 weeks 1 day. Electronically Signed   By: Lupita Raider M.D.   On: 03/20/2021 15:37     Review of Systems  Constitutional:  Positive for chills.  Gastrointestinal:  Positive for abdominal pain. Negative for constipation, diarrhea, nausea and vomiting.  Genitourinary:  Positive for pelvic pain. Negative for dysuria, flank pain, vaginal bleeding and vaginal discharge.  Physical Exam   Blood pressure (!) 120/54, pulse 82, temperature 97.9 F (36.6 C), temperature source Oral, resp. rate 16, last menstrual period 12/20/2020, SpO2 99 %.  Physical Exam Vitals and nursing note reviewed.  Constitutional:      General: She is not in acute distress.    Appearance: Normal appearance. She is not ill-appearing, toxic-appearing or diaphoretic.  HENT:     Head: Normocephalic.  Abdominal:     Palpations: Abdomen is soft.     Tenderness: There is abdominal tenderness in the right lower quadrant and suprapubic area. There is guarding. There is no rebound.  Genitourinary:    Comments: Cervix is long, closed, posterior.  Exam by Venia Carbon, NP  Musculoskeletal:        General: Normal range of motion.  Skin:    General: Skin is warm.  Neurological:     Mental Status: She is alert and oriented to person, place, and  time.  Psychiatric:        Mood and Affect: Mood is depressed. Affect is tearful.        Speech: Speech normal.        Behavior: Behavior normal.        Thought Content: Thought content does not include suicidal ideation.   MAU Course  Procedures None  MDM  Patient with several visits to the MAU with abdominal pain. This pain was present prior to pregnancy. She has a normal WBC count, no nausea or vomiting. She has a lot of stress/anxiety with this pregnancy. She is unsure whether she is keeping the pregnancy . She has shut her family out in an effort to cope with these feelings.  She is interested in speaking with someone about her feelings and anxiety. Her abdominal pain may be a stress response as there is nothing today proving acute abdomen.  Social work consulted today in MAU.   Assessment and Plan   A:  1. Abdominal pain affecting pregnancy   2. [redacted] weeks gestation of  pregnancy   3. Episode of anxiety      P:  Discharge home in stable condition Return to MAU if symptoms worsen  Referral to integrated health Message sent to [facility] for scheduling of OB appointment Rx: Vistaril, Iron, colace  Huzaifa Viney, Harolyn Rutherford, NP 03/20/2021 6:13 PM

## 2021-03-20 NOTE — MAU Note (Signed)
Pt c/o sharp pain in her pelvis that started around 12pm today. Gets worse when she walks. Denies any vag bleeding or discharge.

## 2021-03-21 LAB — GC/CHLAMYDIA PROBE AMP (~~LOC~~) NOT AT ARMC
Chlamydia: NEGATIVE
Comment: NEGATIVE
Comment: NORMAL
Neisseria Gonorrhea: NEGATIVE

## 2021-03-26 ENCOUNTER — Ambulatory Visit (INDEPENDENT_AMBULATORY_CARE_PROVIDER_SITE_OTHER): Payer: Self-pay | Admitting: Certified Nurse Midwife

## 2021-03-26 ENCOUNTER — Other Ambulatory Visit: Payer: Self-pay

## 2021-03-26 ENCOUNTER — Institutional Professional Consult (permissible substitution): Payer: Medicaid Other | Admitting: Licensed Clinical Social Worker

## 2021-03-26 VITALS — BP 118/71 | HR 89 | Temp 97.5°F | Wt 135.0 lb

## 2021-03-26 DIAGNOSIS — Z3491 Encounter for supervision of normal pregnancy, unspecified, first trimester: Secondary | ICD-10-CM

## 2021-03-26 DIAGNOSIS — Z3A13 13 weeks gestation of pregnancy: Secondary | ICD-10-CM

## 2021-03-26 DIAGNOSIS — N949 Unspecified condition associated with female genital organs and menstrual cycle: Secondary | ICD-10-CM

## 2021-03-26 NOTE — Progress Notes (Signed)
History:   Valerie Newman is a 20 y.o. G2P1001 at [redacted]w[redacted]d by LMP being seen today for her first obstetrical visit.  Her obstetrical history is not significant. Patient does intend to breast feed. Breast fed last child for 59mo. Experienced a lot of ambivalence about this pregnancy but has decided to continue it. Pregnancy history fully reviewed.  Patient reports  improving nausea, able to take just nightly phenergan. Having some anxiety but has an appointment with SW after OB visit. Reported several episodes of right sided lower abdominal pain that was severe and made her present to MAU twice. Testing ruled out acute etiology, last provider suggested it was anxiety related since onset was during a crying spell .     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   Allergy to pollen    Anemia    Anxiety    Asthma    Past Surgical History:  Procedure Laterality Date   NO PAST SURGERIES     TOOTH EXTRACTION     Family History  Problem Relation Age of Onset   Heart disease Mother    Stroke Mother    Hypertension Maternal Grandmother    Diabetes Maternal Grandmother    Diabetes Maternal Grandfather    Diabetes Maternal Uncle    Diabetes Maternal Aunt    Social History   Tobacco Use   Smoking status: Never    Passive exposure: Never   Smokeless tobacco: Never  Vaping Use   Vaping Use: Never used  Substance Use Topics   Alcohol use: No   Drug use: No   Allergies  Allergen Reactions   Bee Venom Swelling   Metronidazole Nausea And Vomiting   Wasp Venom  Swelling   Current Outpatient Medications on File Prior to Visit  Medication Sig Dispense Refill   Blood Pressure Monitoring (BLOOD PRESSURE MONITOR AUTOMAT) DEVI 1 Device by Does not apply route daily. Automatic blood pressure cuff regular size. To monitor blood pressure regularly at home. ICD-10 code:Z34.90 1 each 0   diphenhydrAMINE (BENADRYL) 25 MG tablet Take 25 mg by mouth every 6 (six) hours as needed.     docusate sodium (COLACE) 100 MG capsule Take 1 capsule (100 mg total) by mouth daily as needed. 30 capsule 2   Ferrous Sulfate (IRON) 325 (65 Fe) MG TABS Take 1 tablet (325 mg total) by mouth every other day. 30 tablet 0   fluticasone (FLONASE) 50 MCG/ACT nasal spray Place 1 spray into both nostrils 2 (two) times daily. 16 g 0   hydrOXYzine (VISTARIL) 25 MG capsule Take 1 capsule (25 mg total) by mouth 3 (three) times daily as needed. 30 capsule 0   metoCLOPramide (REGLAN) 10 MG tablet Take 1 tablet (10 mg total) by mouth 3 (three) times daily with meals as needed for nausea. 30 tablet 2  Misc. Devices (GOJJI WEIGHT SCALE) MISC 1 Device by Does not apply route daily as needed. To weight self daily as needed at home. ICD-10 code: Z34.90 1 each 0   Prenatal Vit-Fe Fumarate-FA (PRENATAL PLUS VITAMIN/MINERAL) 27-1 MG TABS Take 1 tablet by mouth daily at 6 (six) AM. 30 tablet 12   No current facility-administered medications on file prior to visit.    Review of Systems Pertinent items noted in HPI and remainder of comprehensive ROS otherwise negative. Physical Exam:   Vitals:   03/26/21 1315  BP: 118/71  Pulse: 89  Temp: (!) 97.5 F (36.4 C)  Weight: 135 lb (61.2 kg)   Fetal Heart Rate (bpm): 155  Constitutional: Well-developed, well-nourished pregnant female in no acute distress.  HEENT: PERRLA Skin: normal color and turgor, no rash Cardiovascular: normal rate & rhythm, no murmur Respiratory: normal effort, lung sounds clear throughout GI: Abd soft, non-tender, pos BS x 4,  gravid appropriate for gestational age MS: Extremities nontender, no edema, normal ROM Neurologic: Alert and oriented x 4.  GU: no CVA tenderness Pelvic: exam deferred  Assessment & Plan:  1. Supervision of low-risk pregnancy, first trimester - Doing well overall, feeling less nausea and more confident about the pregnancy.  2. [redacted] weeks gestation of pregnancy - Routine OB care  - Will collect AFP at next appointment  3. Round ligament pain - Educated about triggers for round ligament pain and demonstrated stretches/massages to relieve and prevent RLP.  4. Initial obstetric visit in first trimester - Initial labs reviewed - Continue prenatal vitamins. - Problem list reviewed and updated. - Genetic Screening discussed, First trimester screen, Quad screen, and NIPS: ordered. - Ultrasound discussed; fetal anatomic survey: ordered. - Anticipatory guidance about prenatal visits given including labs, ultrasounds, and testing. - Discussed usage of Babyscripts and virtual visits as additional source of managing and completing prenatal visits in midst of coronavirus and pandemic.   - Encouraged to complete MyChart Registration for her ability to review results, send requests, and have questions addressed.  - The nature of [place] [facility]Faculty Practice with multiple MDs and Advanced Practice Providers was explained to patient; also emphasized that residents, students are part of our team. - Routine obstetric precautions reviewed. Encouraged to seek out care at office or emergency room [facility] MAU preferred) for urgent and/or emergent concerns. Return in about 1 month (around 04/26/2021) for IN-PERSON, LOB.    Edd Arbour, MSN, CNM, IBCLC Certified Nurse Midwife, [facility]

## 2021-04-09 ENCOUNTER — Other Ambulatory Visit: Payer: Self-pay

## 2021-04-09 ENCOUNTER — Ambulatory Visit (INDEPENDENT_AMBULATORY_CARE_PROVIDER_SITE_OTHER): Payer: Medicaid Other | Admitting: Certified Nurse Midwife

## 2021-04-09 ENCOUNTER — Ambulatory Visit (INDEPENDENT_AMBULATORY_CARE_PROVIDER_SITE_OTHER): Payer: Medicaid Other | Admitting: Licensed Clinical Social Worker

## 2021-04-09 VITALS — BP 110/69 | HR 93 | Temp 98.2°F | Wt 133.4 lb

## 2021-04-09 DIAGNOSIS — O99340 Other mental disorders complicating pregnancy, unspecified trimester: Secondary | ICD-10-CM | POA: Diagnosis not present

## 2021-04-09 DIAGNOSIS — O99342 Other mental disorders complicating pregnancy, second trimester: Secondary | ICD-10-CM

## 2021-04-09 DIAGNOSIS — F419 Anxiety disorder, unspecified: Secondary | ICD-10-CM | POA: Diagnosis not present

## 2021-04-09 DIAGNOSIS — Z3492 Encounter for supervision of normal pregnancy, unspecified, second trimester: Secondary | ICD-10-CM

## 2021-04-09 DIAGNOSIS — Z3A15 15 weeks gestation of pregnancy: Secondary | ICD-10-CM

## 2021-04-09 MED ORDER — MAGNESIUM OXIDE -MG SUPPLEMENT 200 MG PO TABS: 400.0000 mg | ORAL_TABLET | Freq: Every day | ORAL | 3 refills | Status: DC

## 2021-04-09 MED ORDER — HYDROXYZINE HCL 25 MG PO TABS: 25.0000 mg | ORAL_TABLET | Freq: Every day | ORAL | 2 refills | Status: DC

## 2021-04-09 NOTE — BH Specialist Note (Signed)
Integrated Behavioral Health Initial In-Person Visit  MRN: [id number] Name: Valerie Newman  Number of Integrated Behavioral Health Clinician visits:: 1/6 Session Start time: 9:00am  Session End time: 9:56am Total time: 56 minutes in person at [facility]  Types of Service: Individual psychotherapy  Interpretor:No. Interpretor Name and Language: none   Warm Hand Off Completed.        Subjective: Valerie Newman is a 20 y.o. female accompanied by n/a Patient was referred by Tyler Aas for anxiety. 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 Sigler reports anxiety symptoms and social stressors negatively impacting her pregnancy. Ms. Tumblin and LCSW A. Felton Clinton discuss coping skills    Assessment: Patient currently experiencing anxiety affecting pregnancy.   Patient may benefit from integrated behavioral health.  Plan: Follow up with behavioral health clinician on : as needed  Behavioral recommendations: Set boundaries, develop a routine to prevent burnout, deep breathing exercises. Referral(s): Integrated [employer] (In Clinic) "From scale of 1-10, how likely are you to follow plan?":   Gwyndolyn Saxon, LCSW

## 2021-04-09 NOTE — Progress Notes (Signed)
   PRENATAL VISIT NOTE  Subjective:  Valerie Newman is a 20 y.o. G2P1001 at [redacted]w[redacted]d being seen today for ongoing prenatal care.  She is currently monitored for the following issues for this low-risk pregnancy and has Supervision of other normal pregnancy, antepartum on their problem list.  Patient reports  decreasing nausea with very little vomiting anymore, now able to eat well. Having a lot of anxiety related to family needs and difficulty setting boundaries. Unable to sleep when she lays down because her mind races. No other physical complaints .  Contractions: Not present. Vag. Bleeding: None.  Movement: Present. Denies leaking of fluid.   The following portions of the patient's history were reviewed and updated as appropriate: allergies, current medications, past family history, past medical history, past social history, past surgical history and problem list.   Objective:   Vitals:   04/09/21 0848  BP: 110/69  Pulse: 93  Temp: 98.2 F (36.8 C)  Weight: 133 lb 6.4 oz (60.5 kg)    Fetal Status: Fetal Heart Rate (bpm): 162   Movement: Present     General:  Alert, oriented and cooperative. Patient is in no acute distress.  Skin: Skin is warm and dry. No rash noted.   Cardiovascular: Normal heart rate noted  Respiratory: Normal respiratory effort, no problems with respiration noted  Abdomen: Soft, gravid, appropriate for gestational age.  Pain/Pressure: Absent     Pelvic: Cervical exam deferred        Extremities: Normal range of motion.  Edema: None  Mental Status: Normal mood and affect. Normal behavior. Normal judgment and thought content.   Assessment and Plan:  Pregnancy: G2P1001 at [redacted]w[redacted]d 1. Supervision of low-risk pregnancy, second trimester - Doing ok, less nausea and starting to feel flutters  2. [redacted] weeks gestation of pregnancy - Routine OB care, will get AFP and PAP at next visit (daughter is with her)  3. Anxiety in pregnancy in second trimester, antepartum - Has a  BH visit immediately after this, encouraged her to talk about boundary setting and self-worth with Sue Lush. - Emphasized importance of saying no when it benefits her health and wellness. Validated her feelings of anxiety and stress and offered medications in addition to CBT. Agreed to start with magnesium and vistaril at night to help her sleep, can progress to SSRI if needed. - hydrOXYzine (ATARAX/VISTARIL) 25 MG tablet; Take 1 tablet (25 mg total) by mouth at bedtime.  Dispense: 30 tablet; Refill: 2 - Magnesium Oxide (MAG-OXIDE) 200 MG TABS; Take 2 tablets (400 mg total) by mouth at bedtime. If that amount causes loose stools in the am, switch to 200mg  daily at bedtime.  Dispense: 60 tablet; Refill: 3  Preterm labor symptoms and general obstetric precautions including but not limited to vaginal bleeding, contractions, leaking of fluid and fetal movement were reviewed in detail with the patient. Please refer to After Visit Summary for other counseling recommendations.   Return in about 4 weeks (around 05/07/2021) for IN-PERSON, LOB.  Future Appointments  Date Time Provider Department Center  04/09/2021  9:15 AM 04/11/2021, LCSW [facility] None  05/02/2021  9:45 AM [facility] US[facility]  05/06/2021  2:35 PM 05/08/2021, PA-C [facility] None  06/04/2021  9:15 AM 06/06/2021, CNM [facility] None    Bernerd Limbo, CNM

## 2021-04-21 ENCOUNTER — Other Ambulatory Visit (HOSPITAL_COMMUNITY)
Admission: RE | Admit: 2021-04-21 | Discharge: 2021-04-21 | Disposition: A | Discharge status: Home / Self Care | Payer: Medicaid Other | Source: Ambulatory Visit | Attending: Obstetrics and Gynecology | Admitting: Obstetrics and Gynecology

## 2021-04-21 ENCOUNTER — Ambulatory Visit (INDEPENDENT_AMBULATORY_CARE_PROVIDER_SITE_OTHER): Payer: Medicaid Other

## 2021-04-21 ENCOUNTER — Ambulatory Visit: Payer: Self-pay

## 2021-04-21 ENCOUNTER — Other Ambulatory Visit: Payer: Self-pay

## 2021-04-21 VITALS — BP 98/68 | HR 98 | Temp 98.1°F | Wt 135.4 lb

## 2021-04-21 DIAGNOSIS — M549 Dorsalgia, unspecified: Secondary | ICD-10-CM

## 2021-04-21 DIAGNOSIS — O99891 Other specified diseases and conditions complicating pregnancy: Secondary | ICD-10-CM

## 2021-04-21 DIAGNOSIS — Z3480 Encounter for supervision of other normal pregnancy, unspecified trimester: Secondary | ICD-10-CM | POA: Diagnosis present

## 2021-04-21 LAB — POCT URINALYSIS DIPSTICK OB
Bilirubin, UA: NEGATIVE
Blood, UA: NEGATIVE
Glucose, UA: NEGATIVE
Nitrite, UA: NEGATIVE
Spec Grav, UA: 1.02
Urobilinogen, UA: 2 U/dL — AB
pH, UA: 7.5

## 2021-04-21 NOTE — Progress Notes (Signed)
  SUBJECTIVE:  20 y.o. female complains of lower back pain for "several" week(s). Denies abnormal vaginal bleeding or significant pelvic pain or fever. Denies history of known exposure to STD but wanted to be tested for STD's.  Patient's last menstrual period was 12/20/2020.  OBJECTIVE:  She appears well, afebrile. Urine dipstick: positive for WBC's, positive for protein, positive for urobilinogen, and positive for ketones.  FHT 129  ASSESSMENT: Lower back pain    PLAN:  GC, chlamydia, trichomonas, BVAG, CVAG probe sent to lab. Treatment: To be determined once lab results are received ROV prn if symptoms persist or worsen. Urine Culture and Dip obtained  Gearldine Shown CMA

## 2021-04-22 LAB — CERVICOVAGINAL ANCILLARY ONLY
Bacterial Vaginitis (gardnerella): NEGATIVE
Candida Glabrata: NEGATIVE
Candida Vaginitis: POSITIVE — AB
Chlamydia: NEGATIVE
Comment: NEGATIVE
Comment: NEGATIVE
Comment: NEGATIVE
Comment: NEGATIVE
Comment: NEGATIVE
Comment: NORMAL
Neisseria Gonorrhea: NEGATIVE
Trichomonas: NEGATIVE

## 2021-04-24 LAB — URINE CULTURE, OB REFLEX

## 2021-04-24 LAB — CULTURE, OB URINE

## 2021-05-02 ENCOUNTER — Ambulatory Visit: Payer: Medicaid Other | Attending: Obstetrics and Gynecology

## 2021-05-02 ENCOUNTER — Other Ambulatory Visit: Payer: Self-pay | Admitting: Obstetrics and Gynecology

## 2021-05-02 ENCOUNTER — Other Ambulatory Visit: Payer: Self-pay

## 2021-05-02 ENCOUNTER — Other Ambulatory Visit: Payer: Self-pay | Admitting: *Deleted

## 2021-05-02 DIAGNOSIS — Z3A19 19 weeks gestation of pregnancy: Secondary | ICD-10-CM | POA: Diagnosis not present

## 2021-05-02 DIAGNOSIS — Z363 Encounter for antenatal screening for malformations: Secondary | ICD-10-CM | POA: Insufficient documentation

## 2021-05-02 DIAGNOSIS — Z148 Genetic carrier of other disease: Secondary | ICD-10-CM

## 2021-05-02 DIAGNOSIS — Z3689 Encounter for other specified antenatal screening: Secondary | ICD-10-CM | POA: Diagnosis not present

## 2021-05-02 DIAGNOSIS — O35BXX0 Maternal care for other (suspected) fetal abnormality and damage, fetal cardiac anomalies, not applicable or unspecified: Secondary | ICD-10-CM

## 2021-05-02 DIAGNOSIS — O358XX0 Maternal care for other (suspected) fetal abnormality and damage, not applicable or unspecified: Secondary | ICD-10-CM | POA: Insufficient documentation

## 2021-05-02 DIAGNOSIS — Z362 Encounter for other antenatal screening follow-up: Secondary | ICD-10-CM

## 2021-05-02 DIAGNOSIS — O99342 Other mental disorders complicating pregnancy, second trimester: Secondary | ICD-10-CM | POA: Diagnosis not present

## 2021-05-02 DIAGNOSIS — Z3480 Encounter for supervision of other normal pregnancy, unspecified trimester: Secondary | ICD-10-CM

## 2021-05-02 DIAGNOSIS — F419 Anxiety disorder, unspecified: Secondary | ICD-10-CM | POA: Diagnosis not present

## 2021-05-05 ENCOUNTER — Telehealth: Payer: Self-pay | Admitting: *Deleted

## 2021-05-05 NOTE — Telephone Encounter (Signed)
Received message from After hours triage from 05/02/2021 at 7:47 PM that patient reported vaginal bleeding and/or leaking fluid. Called patient this morning regarding symptoms. Patient reported she is not having vaginal bleeding today. Nurse asked patient if she had sex prior to bleeding, patient reported yes. Advised patient that some bleeding/spotting can occur after sex. However, she should not bleed like having a menstrual cycle. Patient denied pelvic pain. Advised patient is she start having any bleeding and/or abdominal/pelvic pain to go to MAU. Patient voiced understanding. Patient has an upcoming appointment 05/06/2021. Will forward chart to provider for review.  Please remember that if you are not able to keep your appointment to call the office and reschedule, or cancel. If you no-show or cancel with less than 24 hour notice we will charge you, not your insurance a $50 fee.    Clovis Pu, RN

## 2021-05-06 ENCOUNTER — Other Ambulatory Visit (HOSPITAL_COMMUNITY)
Admission: RE | Admit: 2021-05-06 | Discharge: 2021-05-06 | Disposition: A | Discharge status: Home / Self Care | Payer: Medicaid Other | Source: Ambulatory Visit | Attending: Medical | Admitting: Medical

## 2021-05-06 ENCOUNTER — Ambulatory Visit (INDEPENDENT_AMBULATORY_CARE_PROVIDER_SITE_OTHER): Payer: Medicaid Other | Admitting: Medical

## 2021-05-06 ENCOUNTER — Other Ambulatory Visit: Payer: Self-pay

## 2021-05-06 VITALS — BP 106/72 | HR 80 | Temp 97.7°F | Wt 138.2 lb

## 2021-05-06 DIAGNOSIS — O99342 Other mental disorders complicating pregnancy, second trimester: Secondary | ICD-10-CM

## 2021-05-06 DIAGNOSIS — Z3A19 19 weeks gestation of pregnancy: Secondary | ICD-10-CM

## 2021-05-06 DIAGNOSIS — Z3480 Encounter for supervision of other normal pregnancy, unspecified trimester: Secondary | ICD-10-CM | POA: Insufficient documentation

## 2021-05-06 DIAGNOSIS — B3731 Acute candidiasis of vulva and vagina: Secondary | ICD-10-CM

## 2021-05-06 DIAGNOSIS — F419 Anxiety disorder, unspecified: Secondary | ICD-10-CM | POA: Insufficient documentation

## 2021-05-06 NOTE — Progress Notes (Signed)
   PRENATAL VISIT NOTE  Subjective:  Valerie Newman is a 20 y.o. G2P1001 at [redacted]w[redacted]d being seen today for ongoing prenatal care.  She is currently monitored for the following issues for this low-risk pregnancy and has Supervision of other normal pregnancy, antepartum and Anxiety in pregnancy in second trimester, antepartum on their problem list.  Patient reports no complaints.  Contractions: Not present. Vag. Bleeding: None.  Movement: Present. Denies leaking of fluid.   The following portions of the patient's history were reviewed and updated as appropriate: allergies, current medications, past family history, past medical history, past social history, past surgical history and problem list.   Objective:   Vitals:   05/06/21 1440  BP: 106/72  Pulse: 80  Temp: 97.7 F (36.5 C)  Weight: 138 lb 3.2 oz (62.7 kg)    Fetal Status: Fetal Heart Rate (bpm): 162   Movement: Present     General:  Alert, oriented and cooperative. Patient is in no acute distress.  Skin: Skin is warm and dry. No rash noted.   Cardiovascular: Normal heart rate noted  Respiratory: Normal respiratory effort, no problems with respiration noted  Abdomen: Soft, gravid, appropriate for gestational age.  Pain/Pressure: Absent     Pelvic: Cervical exam deferred       Vagina without erythemal, scant bleeding following pap smear, small white discharge noted  Extremities: Normal range of motion.  Edema: None  Mental Status: Normal mood and affect. Normal behavior. Normal judgment and thought content.   Assessment and Plan:  Pregnancy: G2P1001 at [redacted]w[redacted]d 1. Supervision of other normal pregnancy, antepartum - Cytology - PAP( [facility]) - AFP, Serum, Open Spina Bifida - Declined flu shot today  - May benefit from Doula support, email sent - Anatomy US scheduled 05/30/21  2. Anxiety in pregnancy in second trimester, antepartum - Referred to Sue Lush  3. [redacted] weeks gestation of pregnancy  Preterm labor symptoms and general  obstetric precautions including but not limited to vaginal bleeding, contractions, leaking of fluid and fetal movement were reviewed in detail with the patient. Please refer to After Visit Summary for other counseling recommendations.   Return in about 4 weeks (around 06/03/2021) for LOB, In-Person.  Future Appointments  Date Time Provider Department Center  05/30/2021 10:30 AM [facility] NURSE [facility]  05/30/2021 10:45 AM [facility] US4 [facility]  06/04/2021  9:15 AM Bernerd Limbo, CNM [facility] None    Vonzella Nipple, PA-C

## 2021-05-08 LAB — AFP, SERUM, OPEN SPINA BIFIDA
AFP MoM: 1.14
AFP Value: 68.8 ng/mL
Gest. Age on Collection Date: 19 weeks
Maternal Age At EDD: 22.1 yr
OSBR Risk 1 IN: 10000
Test Results:: NEGATIVE
Weight: 138 [lb_av]

## 2021-05-13 LAB — CYTOLOGY - PAP: Diagnosis: NEGATIVE

## 2021-05-14 ENCOUNTER — Encounter: Payer: Medicaid Other | Admitting: Licensed Clinical Social Worker

## 2021-05-14 ENCOUNTER — Other Ambulatory Visit: Payer: Self-pay

## 2021-05-14 MED ORDER — TERCONAZOLE 0.4 % VA CREA: 1.0000 | TOPICAL_CREAM | Freq: Every day | VAGINAL | 0 refills | Status: DC

## 2021-05-14 NOTE — Addendum Note (Signed)
Addended by: Marny Lowenstein on: 05/14/2021 01:54 PM   Modules accepted: Orders

## 2021-05-21 ENCOUNTER — Encounter: Payer: Self-pay | Admitting: Certified Nurse Midwife

## 2021-05-27 ENCOUNTER — Encounter (HOSPITAL_COMMUNITY): Payer: Self-pay | Admitting: Obstetrics & Gynecology

## 2021-05-27 ENCOUNTER — Inpatient Hospital Stay (HOSPITAL_COMMUNITY)
Admission: AD | Admit: 2021-05-27 | Discharge: 2021-05-27 | Disposition: A | Discharge status: Home / Self Care | Payer: Medicaid Other | Attending: Obstetrics & Gynecology | Admitting: Obstetrics & Gynecology

## 2021-05-27 ENCOUNTER — Other Ambulatory Visit: Payer: Self-pay

## 2021-05-27 DIAGNOSIS — R350 Frequency of micturition: Secondary | ICD-10-CM | POA: Insufficient documentation

## 2021-05-27 DIAGNOSIS — O98812 Other maternal infectious and parasitic diseases complicating pregnancy, second trimester: Secondary | ICD-10-CM | POA: Diagnosis not present

## 2021-05-27 DIAGNOSIS — Z3480 Encounter for supervision of other normal pregnancy, unspecified trimester: Secondary | ICD-10-CM

## 2021-05-27 DIAGNOSIS — N898 Other specified noninflammatory disorders of vagina: Secondary | ICD-10-CM | POA: Insufficient documentation

## 2021-05-27 DIAGNOSIS — Z3A22 22 weeks gestation of pregnancy: Secondary | ICD-10-CM

## 2021-05-27 DIAGNOSIS — K5900 Constipation, unspecified: Secondary | ICD-10-CM | POA: Insufficient documentation

## 2021-05-27 DIAGNOSIS — B3731 Acute candidiasis of vulva and vagina: Secondary | ICD-10-CM

## 2021-05-27 DIAGNOSIS — R102 Pelvic and perineal pain: Secondary | ICD-10-CM

## 2021-05-27 DIAGNOSIS — O26892 Other specified pregnancy related conditions, second trimester: Secondary | ICD-10-CM

## 2021-05-27 DIAGNOSIS — O26899 Other specified pregnancy related conditions, unspecified trimester: Secondary | ICD-10-CM

## 2021-05-27 LAB — URINALYSIS, ROUTINE W REFLEX MICROSCOPIC
Bilirubin Urine: NEGATIVE
Glucose, UA: NEGATIVE mg/dL
Hgb urine dipstick: NEGATIVE
Ketones, ur: NEGATIVE mg/dL
Nitrite: NEGATIVE
Protein, ur: NEGATIVE mg/dL
Specific Gravity, Urine: 1.01 (ref 1.005–1.030)
pH: 7 (ref 5.0–8.0)

## 2021-05-27 LAB — URINALYSIS, MICROSCOPIC (REFLEX)

## 2021-05-27 LAB — WET PREP, GENITAL
Clue Cells Wet Prep HPF POC: NONE SEEN
Sperm: NONE SEEN
Trich, Wet Prep: NONE SEEN
WBC, Wet Prep HPF POC: >=10 — AB (ref <10)

## 2021-05-27 LAB — GC/CHLAMYDIA PROBE AMP (~~LOC~~) NOT AT ARMC
Chlamydia: NEGATIVE
Comment: NEGATIVE
Comment: NORMAL
Neisseria Gonorrhea: NEGATIVE

## 2021-05-27 MED ORDER — TERCONAZOLE 0.4 % VA CREA: 1.0000 | TOPICAL_CREAM | Freq: Every day | VAGINAL | 0 refills | Status: DC

## 2021-05-27 MED ORDER — ACETAMINOPHEN 500 MG PO TABS
1000.0000 mg | ORAL_TABLET | Freq: Once | ORAL | Status: AC
  Administered 2021-05-27: 1000 mg via ORAL
  Filled 2021-05-27: qty 2

## 2021-05-27 NOTE — MAU Provider Note (Signed)
History     CSN: [id number]  Arrival date and time: 05/27/21 4008   None     Chief Complaint  Patient presents with   Pelvic Pain   HPI Valerie Newman is a 20 y.o. G2P1001 at [redacted]w[redacted]d who presents to MAU for pelvic pain. Patient reports constant pelvic pain x3 days. Pain is worse with moving, standing, and walking. The only thing that helps relieve pain is when she sits down. She endorses some white discharge, but denies itching/odor, or vaginal bleeding. She reports some ongoing urinary frequency, but denies pain/burning. Endorses fetal movement.   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   Allergy to pollen    Anemia    Anxiety    Asthma     Past Surgical History:  Procedure Laterality Date   NO PAST SURGERIES     TOOTH EXTRACTION      Family History  Problem Relation Age of Onset   Heart disease Mother    Stroke Mother    Hypertension Maternal Grandmother    Diabetes Maternal Grandmother    Diabetes Maternal Grandfather    Diabetes Maternal Uncle    Diabetes Maternal Aunt     Social History   Tobacco Use   Smoking status: Never    Passive exposure: Never   Smokeless tobacco: Never  Vaping Use   Vaping Use: Never used  Substance Use Topics   Alcohol use: No   Drug use: No    Allergies:  Allergies  Allergen Reactions   Bee Venom Swelling   Metronidazole Nausea And Vomiting   Wasp Venom Swelling    No medications prior to admission.    Review of Systems  Constitutional: Negative.   Respiratory: Negative.    Cardiovascular: Negative.   Gastrointestinal:  Positive for constipation. Negative for abdominal pain, diarrhea, nausea and vomiting.  Genitourinary:  Positive for frequency, pelvic pain and vaginal discharge. Negative for  dysuria and vaginal bleeding.  Musculoskeletal: Negative.   Neurological: Negative.   Physical Exam   Blood pressure 104/63, pulse 79, temperature 97.9 F (36.6 C), temperature source Oral, resp. rate 19, height 5\' 3"  (1.6 m), weight 64.2 kg, last menstrual period 12/20/2020, SpO2 100 %.  Physical Exam Constitutional:      General: She is not in acute distress.    Appearance: She is normal weight.  Eyes:     Pupils: Pupils are equal, round, and reactive to light.  Cardiovascular:     Rate and Rhythm: Normal rate.  Pulmonary:     Effort: Pulmonary effort is normal.  Abdominal:     Palpations: Abdomen is soft.     Tenderness: There is no abdominal tenderness.  Genitourinary:    Comments: Blind swabs collected. Cervix closed/thick.  Musculoskeletal:        General: Normal range of motion.  Skin:    General: Skin is warm and dry.  Neurological:     General: No focal deficit present.     Mental Status: She is alert and oriented  to person, place, and time.  Psychiatric:        Mood and Affect: Mood normal.        Behavior: Behavior normal.        Thought Content: Thought content normal.        Judgment: Judgment normal.   FHT: 158 bpm via doppler  MAU Course  Procedures UA, culture pending Wet prep, GC/CT collected  MDM Wet prep + yeast, cervix closed/thick  Assessment and Plan  [redacted] weeks gestation of pregnancy Pelvic pain affecting pregnancy Vaginal yeast infection  - Discharge home in stable condition - Rx for Terazol cream sent to pharmacy - Recommend support belt, Tylenol, heat/ice prn - Keep OB appointment as scheduled - Return to MAU as needed or for worsening symptoms   Brand Males, CNM 05/27/2021, 9:47 AM

## 2021-05-27 NOTE — MAU Note (Signed)
Presents with c/o pelvic pain x3 days, states pain worsened last night.  States pain is exacerbated with walking and standing.  Denies VB or LOF.  Endorses +FM.

## 2021-05-28 LAB — CULTURE, OB URINE: Culture: 40000 — AB

## 2021-05-30 ENCOUNTER — Ambulatory Visit: Payer: Medicaid Other | Attending: Obstetrics

## 2021-05-30 ENCOUNTER — Other Ambulatory Visit: Payer: Self-pay

## 2021-05-30 ENCOUNTER — Ambulatory Visit: Payer: Medicaid Other | Admitting: *Deleted

## 2021-05-30 VITALS — BP 106/65 | HR 76

## 2021-05-30 DIAGNOSIS — F419 Anxiety disorder, unspecified: Secondary | ICD-10-CM | POA: Diagnosis present

## 2021-05-30 DIAGNOSIS — O35BXX0 Maternal care for other (suspected) fetal abnormality and damage, fetal cardiac anomalies, not applicable or unspecified: Secondary | ICD-10-CM

## 2021-05-30 DIAGNOSIS — Z148 Genetic carrier of other disease: Secondary | ICD-10-CM | POA: Diagnosis not present

## 2021-05-30 DIAGNOSIS — O358XX0 Maternal care for other (suspected) fetal abnormality and damage, not applicable or unspecified: Secondary | ICD-10-CM | POA: Insufficient documentation

## 2021-05-30 DIAGNOSIS — O99342 Other mental disorders complicating pregnancy, second trimester: Secondary | ICD-10-CM | POA: Diagnosis present

## 2021-05-30 DIAGNOSIS — Z362 Encounter for other antenatal screening follow-up: Secondary | ICD-10-CM | POA: Insufficient documentation

## 2021-05-30 DIAGNOSIS — Z3480 Encounter for supervision of other normal pregnancy, unspecified trimester: Secondary | ICD-10-CM | POA: Diagnosis present

## 2021-05-30 DIAGNOSIS — Z3A23 23 weeks gestation of pregnancy: Secondary | ICD-10-CM | POA: Insufficient documentation

## 2021-06-04 ENCOUNTER — Ambulatory Visit (INDEPENDENT_AMBULATORY_CARE_PROVIDER_SITE_OTHER): Payer: Medicaid Other | Admitting: Licensed Clinical Social Worker

## 2021-06-04 ENCOUNTER — Telehealth (INDEPENDENT_AMBULATORY_CARE_PROVIDER_SITE_OTHER): Payer: Medicaid Other | Admitting: Certified Nurse Midwife

## 2021-06-04 ENCOUNTER — Other Ambulatory Visit: Payer: Self-pay

## 2021-06-04 VITALS — BP 112/63 | HR 78 | Temp 97.8°F | Wt 142.6 lb

## 2021-06-04 DIAGNOSIS — O99342 Other mental disorders complicating pregnancy, second trimester: Secondary | ICD-10-CM

## 2021-06-04 DIAGNOSIS — F419 Anxiety disorder, unspecified: Secondary | ICD-10-CM

## 2021-06-04 DIAGNOSIS — Z3A23 23 weeks gestation of pregnancy: Secondary | ICD-10-CM

## 2021-06-04 DIAGNOSIS — Z3492 Encounter for supervision of normal pregnancy, unspecified, second trimester: Secondary | ICD-10-CM

## 2021-06-05 ENCOUNTER — Other Ambulatory Visit: Payer: Self-pay

## 2021-06-05 ENCOUNTER — Inpatient Hospital Stay (HOSPITAL_COMMUNITY)
Admission: AD | Admit: 2021-06-05 | Discharge: 2021-06-05 | Disposition: A | Discharge status: Home / Self Care | Payer: Medicaid Other | Attending: Obstetrics and Gynecology | Admitting: Obstetrics and Gynecology

## 2021-06-05 ENCOUNTER — Encounter (HOSPITAL_COMMUNITY): Payer: Self-pay | Admitting: Obstetrics and Gynecology

## 2021-06-05 DIAGNOSIS — O2342 Unspecified infection of urinary tract in pregnancy, second trimester: Secondary | ICD-10-CM | POA: Diagnosis not present

## 2021-06-05 DIAGNOSIS — R109 Unspecified abdominal pain: Secondary | ICD-10-CM | POA: Diagnosis not present

## 2021-06-05 DIAGNOSIS — Z3A23 23 weeks gestation of pregnancy: Secondary | ICD-10-CM

## 2021-06-05 DIAGNOSIS — Z3480 Encounter for supervision of other normal pregnancy, unspecified trimester: Secondary | ICD-10-CM

## 2021-06-05 DIAGNOSIS — N390 Urinary tract infection, site not specified: Secondary | ICD-10-CM | POA: Diagnosis not present

## 2021-06-05 DIAGNOSIS — O26892 Other specified pregnancy related conditions, second trimester: Secondary | ICD-10-CM

## 2021-06-05 DIAGNOSIS — N3001 Acute cystitis with hematuria: Secondary | ICD-10-CM | POA: Diagnosis not present

## 2021-06-05 LAB — URINALYSIS, ROUTINE W REFLEX MICROSCOPIC
Bilirubin Urine: NEGATIVE
Glucose, UA: NEGATIVE mg/dL
Ketones, ur: NEGATIVE mg/dL
Leukocytes,Ua: NEGATIVE
Nitrite: NEGATIVE
Protein, ur: 100 mg/dL — AB
Specific Gravity, Urine: 1.025 (ref 1.005–1.030)
pH: 7.5 (ref 5.0–8.0)

## 2021-06-05 LAB — CBC
HCT: 27.7 % — ABNORMAL LOW (ref 36.0–46.0)
Hemoglobin: 8.5 g/dL — ABNORMAL LOW (ref 12.0–15.0)
MCH: 25.6 pg — ABNORMAL LOW (ref 26.0–34.0)
MCHC: 30.7 g/dL (ref 30.0–36.0)
MCV: 83.4 fL (ref 80.0–100.0)
Platelets: 213 10*3/uL (ref 150–400)
RBC: 3.32 MIL/uL — ABNORMAL LOW (ref 3.87–5.11)
RDW: 13.9 % (ref 11.5–15.5)
WBC: 8.2 10*3/uL (ref 4.0–10.5)
nRBC: 0 % (ref 0.0–0.2)

## 2021-06-05 LAB — WET PREP, GENITAL
Clue Cells Wet Prep HPF POC: NONE SEEN
Sperm: NONE SEEN
Trich, Wet Prep: NONE SEEN
WBC, Wet Prep HPF POC: >=10 — AB (ref <10)
Yeast Wet Prep HPF POC: NONE SEEN

## 2021-06-05 LAB — URINALYSIS, MICROSCOPIC (REFLEX): RBC / HPF: >50 RBC/hpf (ref 0–5)

## 2021-06-05 MED ORDER — AMOXICILLIN-POT CLAVULANATE 875-125 MG PO TABS: 1.0000 | ORAL_TABLET | Freq: Two times a day (BID) | ORAL | 0 refills | Status: AC

## 2021-06-05 MED ORDER — LACTATED RINGERS IV BOLUS
1000.0000 mL | Freq: Once | INTRAVENOUS | Status: AC
  Administered 2021-06-05: 16:00:00 1000 mL via INTRAVENOUS

## 2021-06-05 NOTE — MAU Provider Note (Signed)
History     [id number]  Arrival date and time: 06/05/21 1347    Chief Complaint  Patient presents with   Abdominal Pain   Urinary Frequency     HPI Valerie Newman is a 20 y.o. at [redacted]w[redacted]d by LMP, who presents for abdominal cramps.   Patient seen for the same on 05/27/21, at that time wet prep with yeast but otherwise unremarkable workup with cervix closed and thick  Today reports she again had abdominal cramping that started last night around 7 Coming and going [employer] Currently has eased up a bit No vaginal bleeding or loss of fluid Endorses good fetal movement Denies vaginal discharge Denies burning or pain with urination Does endorse urinary frequency Felt like she couldn't get to the bathroom in time earlier  A/Positive/-- (09/27 0859)  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   Allergy to pollen    Anemia    Anxiety    Asthma     Past Surgical History:  Procedure Laterality Date   NO PAST SURGERIES     TOOTH EXTRACTION      Family History  Problem Relation Age of Onset   Heart disease Mother    Stroke Mother    Hypertension Maternal Grandmother    Diabetes Maternal Grandmother    Diabetes Maternal Grandfather    Diabetes Maternal Uncle    Diabetes Maternal Aunt     Social History   Socioeconomic History   Marital status: Single    Spouse name: Not on file   Number of children: 1   Years of education: Not on file   Highest education level: High school graduate  Occupational History   Not on file  Tobacco Use   Smoking status: Never    Passive exposure: Never   Smokeless tobacco: Never  Vaping Use   Vaping Use: Never used  Substance and Sexual Activity   Alcohol use: No   Drug use: No   Sexual activity: Not Currently  Other  Topics Concern   Not on file  Social History Narrative   Not on file   Social Determinants of Health   Financial Resource Strain: Not on file  Food Insecurity: Not on file  Transportation Needs: Not on file  Physical Activity: Not on file  Stress: Not on file  Social Connections: Not on file  Intimate Partner Violence: Not on file    Allergies  Allergen Reactions   Bee Venom Swelling   Metronidazole Nausea And Vomiting   Wasp Venom Swelling    No current facility-administered medications on file prior to encounter.   Current Outpatient Medications on File Prior to Encounter  Medication Sig Dispense Refill   Blood Pressure Monitoring (BLOOD PRESSURE MONITOR AUTOMAT) DEVI 1 Device by Does not apply route daily. Automatic blood pressure cuff regular size. To monitor blood pressure regularly at home. ICD-10 code:Z34.90 1 each 0   diphenhydrAMINE (BENADRYL) 25 MG tablet Take 25 mg by mouth every 6 (six) hours as needed.  fluticasone (FLONASE) 50 MCG/ACT nasal spray Place 1 spray into both nostrils 2 (two) times daily. 16 g 0   hydrOXYzine (ATARAX/VISTARIL) 25 MG tablet Take 1 tablet (25 mg total) by mouth at bedtime. 30 tablet 2   Prenatal Vit-Fe Fumarate-FA (PRENATAL PLUS VITAMIN/MINERAL) 27-1 MG TABS Take 1 tablet by mouth daily at 6 (six) AM. 30 tablet 12   terconazole (TERAZOL 7) 0.4 % vaginal cream Place 1 applicator vaginally at bedtime. Use for seven days 45 g 0     ROS Pertinent positives and negative per HPI, all others reviewed and negative  Physical Exam   BP 102/63    Pulse 77    Temp 98 F (36.7 C) (Oral)    Resp 16    LMP 12/20/2020    SpO2 100%   Patient Vitals for the past 24 hrs:  BP Temp Temp src Pulse Resp SpO2  06/05/21 1427 102/63 -- -- 77 -- --  06/05/21 1417 111/70 98 F (36.7 C) Oral 88 16 100 %    Physical Exam Vitals reviewed.  Constitutional:      General: She is not in acute distress.    Appearance: She is well-developed. She is not  diaphoretic.  Eyes:     General: No scleral icterus. Pulmonary:     Effort: Pulmonary effort is normal. No respiratory distress.  Abdominal:     General: There is no distension.     Palpations: Abdomen is soft.     Tenderness: There is no abdominal tenderness. There is no guarding or rebound.  Skin:    General: Skin is warm and dry.  Neurological:     Mental Status: She is alert.     Coordination: Coordination normal.     Cervical Exam  Closed/thick  Bedside Ultrasound Not done  My interpretation: n/a  FHT Baseline 155, moderate variability, +accels, no decels Toco: quiet Cat: I  Labs Results for orders placed or performed during the hospital encounter of 06/05/21 (from the past 24 hour(s))  Wet prep, genital     Status: Abnormal   Collection Time: 06/05/21  2:54 PM   Specimen: PATH Cytology Cervicovaginal Ancillary Only  Result Value Ref Range   Yeast Wet Prep HPF POC NONE SEEN NONE SEEN   Trich, Wet Prep NONE SEEN NONE SEEN   Clue Cells Wet Prep HPF POC NONE SEEN NONE SEEN   WBC, Wet Prep HPF POC >=10 (A) <10   Sperm NONE SEEN   Urinalysis, Routine w reflex microscopic Urine, Clean Catch     Status: Abnormal   Collection Time: 06/05/21  2:55 PM  Result Value Ref Range   Color, Urine YELLOW YELLOW   APPearance CLEAR CLEAR   Specific Gravity, Urine 1.025 1.005 - 1.030   pH 7.5 5.0 - 8.0   Glucose, UA NEGATIVE NEGATIVE mg/dL   Hgb urine dipstick LARGE (A) NEGATIVE   Bilirubin Urine NEGATIVE NEGATIVE   Ketones, ur NEGATIVE NEGATIVE mg/dL   Protein, ur 151 (A) NEGATIVE mg/dL   Nitrite NEGATIVE NEGATIVE   Leukocytes,Ua NEGATIVE NEGATIVE  Urinalysis, Microscopic (reflex)     Status: Abnormal   Collection Time: 06/05/21  2:55 PM  Result Value Ref Range   RBC / HPF >50 0 - 5 RBC/hpf   WBC, UA 21-50 0 - 5 WBC/hpf   Bacteria, UA RARE (A) NONE SEEN   Squamous Epithelial / LPF 0-5 0 - 5   Mucus PRESENT   CBC     Status: Abnormal  Collection Time: 06/05/21  3:28 PM   Result Value Ref Range   WBC 8.2 4.0 - 10.5 K/uL   RBC 3.32 (L) 3.87 - 5.11 MIL/uL   Hemoglobin 8.5 (L) 12.0 - 15.0 g/dL   HCT 30.0 (L) 92.3 - 30.0 %   MCV 83.4 80.0 - 100.0 fL   MCH 25.6 (L) 26.0 - 34.0 pg   MCHC 30.7 30.0 - 36.0 g/dL   RDW 76.2 26.3 - 33.5 %   Platelets 213 150 - 400 K/uL   nRBC 0.0 0.0 - 0.2 %    Imaging No results found.  MAU Course  Procedures Lab Orders         Wet prep, genital         Culture, OB Urine         Urinalysis, Routine w reflex microscopic Urine, Clean Catch         CBC         Urinalysis, Microscopic (reflex)    Meds ordered this encounter  Medications   lactated ringers bolus 1,000 mL   amoxicillin-clavulanate (AUGMENTIN) 875-125 MG tablet    Sig: Take 1 tablet by mouth 2 (two) times daily for 5 days.    Dispense:  10 tablet    Refill:  0   Imaging Orders  No imaging studies ordered today    MDM moderate  Assessment and Plan  #Abdominal pain in pregnancy, second trimester #UTI in pregnancy #[redacted] weeks gestational age Cervix closed and toco quiet, low suspicion for preterm labor at this point. Does report increased urinary frequency and there is a surprising amount of RBCs and WBCs in her urine. CT scan from 07/2020 did not show any evidence of nephrolithiasis. Given overall picture most suspicious for UTI despite lack of nitrites in urine, will treat empirically with Augmentin and follow up urine culture.   #FWB FHT Cat I NST: Reactive  Dispo: Discharged to home in stable condition.    Venora Maples, MD/MPH 06/05/21 4:27 PM  Allergies as of 06/05/2021       Reactions   Bee Venom Swelling   Metronidazole Nausea And Vomiting   Wasp Venom Swelling        Medication List     TAKE these medications    amoxicillin-clavulanate 875-125 MG tablet Commonly known as: Augmentin Take 1 tablet by mouth 2 (two) times daily for 5 days.   Blood Pressure Monitor Automat Devi 1 Device by Does not apply route daily.  Automatic blood pressure cuff regular size. To monitor blood pressure regularly at home. ICD-10 code:Z34.90   diphenhydrAMINE 25 MG tablet Commonly known as: BENADRYL Take 25 mg by mouth every 6 (six) hours as needed.   fluticasone 50 MCG/ACT nasal spray Commonly known as: FLONASE Place 1 spray into both nostrils 2 (two) times daily.   hydrOXYzine 25 MG tablet Commonly known as: ATARAX Take 1 tablet (25 mg total) by mouth at bedtime.   Prenatal Plus Vitamin/Mineral 27-1 MG Tabs Take 1 tablet by mouth daily at 6 (six) AM.   terconazole 0.4 % vaginal cream Commonly known as: TERAZOL 7 Place 1 applicator vaginally at bedtime. Use for seven days

## 2021-06-05 NOTE — MAU Note (Signed)
...  Valerie Newman is a 20 y.o. at [redacted]w[redacted]d here in MAU reporting: Intermittent RLQ pain that is sharp since yesterday evening. She states moving and fetal movement increases her pain. She states she has also been experiencing urinary frequency since yesterday evening and feels as if she cannot completely empty her bladder. No VB or LOF. +FM.  Pain score: 6/10 RLQ Lab orders placed from triage: UA

## 2021-06-05 NOTE — BH Specialist Note (Signed)
Integrated Behavioral Health Follow Up In-Person Visit  MRN: [id number] Name: Valerie Newman  Number of Integrated Behavioral Health Clinician visits: 1/6 Session Start time: 10:30am  Session End time: 11:24am Total time: 54 mins at [facility] in person   Types of Service: Individual psychotherapy  Interpretor:No. Interpretor Name and Language: none  Subjective: Valerie Newman is a 20 y.o. female accompanied by n/a Patient was referred by Tyler Aas CNM for anxiety. Patient reports the following symptoms/concerns: feeling on edge, crying, lack of support, excessive worry, family conflict and difficulty sleeping Duration of problem: approx 6 months ; Severity of problem: mild  Objective: Mood: Anxious and Depressed and Affect: Appropriate Risk of harm to self or others: No plan to harm self or others  Life Context: Family and Social: Lives with daughter in [place]  School/Work: Store associate at [employer]  Self-Care: n/a Life Changes: New pregnancy  Patient and/or Family's Strengths/Protective Factors: Concrete supports in place (healthy food, safe environments, etc.)  Goals Addressed: Patient will:  Reduce symptoms of: anxiety, depression, and stress   Increase knowledge and/or ability of: coping skills and stress reduction   Demonstrate ability to: Increase healthy adjustment to current life circumstances and Increase adequate support systems for patient/family  Progress towards Goals: Ongoing  Interventions: Interventions utilized:  Mindfulness or Relaxation Training and Supportive Counseling Standardized Assessments completed: Not Needed  Patient and/or Family Response: Valerie Newman is tearful during in person IBH appt and ROB appt.   Assessment: Patient currently experiencing anxiety affecting pregnancy.   Patient may benefit from integrated behavioral health.  Plan: Follow up with behavioral health clinician on : 07/02/2021 Behavioral recommendations:  Create and sustain boundaries, avoid family conflict, schedule mindfulness and selfcare, prioritize rest  Referral(s): Integrated [employer] (In Clinic) "From scale of 1-10, how likely are you to follow plan?":   Gwyndolyn Saxon, LCSW

## 2021-06-05 NOTE — Progress Notes (Signed)
OBSTETRICS PRENATAL VIRTUAL VISIT ENCOUNTER NOTE  Provider location: [facility]   Patient location: In her car, on her way to office - called in to reschedule because she was going to be late. Offered virtual visit with in-person vitals before BH visit  I connected with Valerie Newman on 06/05/21 at  9:15 AM EST by MyChart Video Encounter and verified that I am speaking with the correct person using two identifiers. I discussed the limitations, risks, security and privacy concerns of performing an evaluation and management service virtually and the availability of in person appointments. I also discussed with the patient that there may be a patient responsible charge related to this service. The patient expressed understanding and agreed to proceed. Subjective:  Valerie Newman is a 20 y.o. G2P1001 at [redacted]w[redacted]d being seen today for ongoing prenatal care.  She is currently monitored for the following issues for this low-risk pregnancy and has Supervision of other normal pregnancy, antepartum and Anxiety in pregnancy in second trimester, antepartum on their problem list.  Patient reports  no physical complaints, still have some "moments" of agitation and anxiety related to familial stress .  Contractions: Not present. Vag. Bleeding: None.  Movement: Present. Denies any leaking of fluid.   The following portions of the patient's history were reviewed and updated as appropriate: allergies, current medications, past family history, past medical history, past social history, past surgical history and problem list.   Objective:   Vitals:   06/04/21 1118  BP: 112/63  Pulse: 78  Temp: 97.8 F (36.6 C)  Weight: 142 lb 9.6 oz (64.7 kg)    Fetal Status:     Movement: Present     General:  Alert, oriented and cooperative. Patient is in no acute distress.  Respiratory: Normal respiratory effort, no problems with respiration noted  Mental Status: Normal mood and  affect. Normal behavior. Normal judgment and thought content.  Rest of physical exam deferred due to type of encounter  Imaging: [place] MFM OB FOLLOW UP  Result Date: 05/30/2021 ----------------------------------------------------------------------  OBSTETRICS REPORT                       (Signed Final 05/30/2021 11:11 am) ---------------------------------------------------------------------- Patient Info  ID #:       [id number]                          D.O.B.:  04/20/2001 (20 yrs)  Name:       Valerie Newman                 Visit Date: 05/30/2021 10:44 am ---------------------------------------------------------------------- Performed By  Attending:        Noralee Space MD        Secondary Phy.:   ROLITTA DAWSON  Performed By:     Clayton Lefort RDMS       [facility]  Referred By:      [facility] ---------------------------------------------------------------------- Orders  #  Description                           Code        Ordered By  1  [place] MFM OB FOLLOW UP                   [phone]    Rosana Hoes ----------------------------------------------------------------------  #  Order #                     Accession #                Episode #  1  [id number]                   [id number]                 [id number] ---------------------------------------------------------------------- Indications  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 Cord Insertion:      Previously Visualized  Amniotic Fluid  AFI FV:      Within normal limits                              Largest Pocket(cm)                              7.87 ---------------------------------------------------------------------- Biometry  BPD:      55.5  mm     G. Age:  22w 6d         42  %    CI:        75.01   %    70 - 86                                                          FL/HC:      18.3   %    19.2 - 20.8  HC:      203.3  mm  G. Age:  22w 3d         17  %    HC/AC:      1.11        1.05 - 1.21  AC:       183   mm     G. Age:  23w 1d         46  %    FL/BPD:     67.0   %    71 - 87  FL:       37.2  mm     G. Age:  21w 6d         10  %    FL/AC:      20.3   %    20 - 24  LV:        4.6  mm  Est. FW:     515  gm      1 lb 2 oz     23  % ---------------------------------------------------------------------- OB History  Gravidity:    2         Term:   1  Living:       1 ---------------------------------------------------------------------- Gestational Age  LMP:           23w 0d        Date:  12/20/20                 EDD:   09/26/21  U/S Today:     22w 4d                                        EDD:   09/29/21  Best:          23w 0d     Det. By:  LMP  (12/20/20)          EDD:   09/26/21 ---------------------------------------------------------------------- Anatomy  Cranium:               Appears normal         Aortic Arch:            Previously seen  Cavum:                 Previously seen        Ductal Arch:            Previously seen  Ventricles:            Appears normal         Diaphragm:              Previously seen  Choroid Plexus:        Previously seen        Stomach:                Appears normal, left                                                                         sided  Cerebellum:            Previously seen        Abdomen:  Previously seen  Posterior Fossa:       Previously seen        Abdominal Wall:         Previously seen  Nuchal Fold:           Previously seen        Cord Vessels:           Previously seen  Face:                  Orbits and profile     Kidneys:                Appear normal                         previously seen  Lips:                  Previously seen        Bladder:                Appears normal  Thoracic:              Previously seen        Spine:                  Previously seen  Heart:                 Appears normal; EIF    Upper Extremities:      Previously seen  RVOT:                  Previously seen        Lower Extremities:      Previously seen  LVOT:                  Previously seen  Other:  Nasal bone and lenses previously visualized. 3VV and 3VTV          previously visualized. Heels/feet and open hands/left 5th digit          previously visualized. BCV appears normal ---------------------------------------------------------------------- Cervix Uterus Adnexa  Cervix  Length:            4.2  cm.  Not visualized (advanced GA >24wks)  Right Ovary  Visualized.  Left Ovary  Visualized. ---------------------------------------------------------------------- Comments  Gallbladder with phyrigian cap vs dilated loop of bowel ---------------------------------------------------------------------- Impression  Patient returned for completion of fetal anatomy .Amniotic  fluid is normal and good fetal activity is seen .Fetal biometry  is consistent with her previously-established dates .Fetal  anatomical survey was completed and appears normal. ---------------------------------------------------------------------- Recommendations  Follow-up scans as clinically indicated. ----------------------------------------------------------------------                  Noralee Space, MD Electronically Signed Final Report   05/30/2021 11:11 am  ----------------------------------------------------------------------   Assessment and Plan:  Pregnancy: G2P1001 at 105w6d 1. Supervision of low-risk pregnancy, second trimester - Doing well, feeling regular and vigorous fetal movement   2. [redacted] weeks gestation of pregnancy - Routine OB care including anticipatory guidance re GTT at next visit  3. Anxiety in pregnancy, antepartum, second trimester - Has visit with Gwyndolyn Saxon, SW after this appointment  Preterm labor symptoms and general obstetric precautions including but not limited to vaginal bleeding, contractions, leaking of fluid and fetal movement were reviewed in detail with the patient. I discussed the assessment and treatment plan with the patient. The patient was provided an opportunity  to ask questions and all were answered. The patient agreed with the plan and demonstrated an understanding of the instructions. The patient was advised to call back or seek an in-person office evaluation/go to MAU at [facility] for any urgent or concerning symptoms. Please refer to After Visit Summary for other counseling recommendations.   I provided 15 minutes of face-to-face time during this encounter.  No follow-ups on file.  Future Appointments  Date Time Provider Department Center  07/02/2021  8:15 AM Calvert Cantor, CNM [facility] None  07/02/2021  8:45 AM Gwyndolyn Saxon, LCSW [facility] None    Bernerd Limbo, CNM [facility] [employer]

## 2021-06-06 LAB — GC/CHLAMYDIA PROBE AMP (~~LOC~~) NOT AT ARMC
Chlamydia: NEGATIVE
Comment: NEGATIVE
Comment: NORMAL
Neisseria Gonorrhea: NEGATIVE

## 2021-06-07 LAB — CULTURE, OB URINE: Culture: >=100000 — AB

## 2021-06-30 ENCOUNTER — Ambulatory Visit (INDEPENDENT_AMBULATORY_CARE_PROVIDER_SITE_OTHER): Payer: Self-pay | Admitting: *Deleted

## 2021-06-30 NOTE — Telephone Encounter (Signed)
°  Chief Complaint: abdominal pain Symptoms: constant lower abdominal pain- patient did not disclose pregnancy status until end of triage- advised call OB/GYN Frequency: 3 days Pertinent Negatives:  Disposition: [x] ED /[] Urgent Care (no appt availability in office) / [] Appointment(In office/virtual)/ []  [place] [facility]/ [] Home Care/ [] Refused Recommended Disposition /[] [place] [facility]/ []  Follow-up with PCP Additional Notes: call to office- patient has to be seen at [facility] for this- patient advised OB- if no response- go to ED

## 2021-06-30 NOTE — Telephone Encounter (Signed)
Reason for Disposition  MODERATE-SEVERE abdominal pain (e.g., interferes with normal activities, awakens from sleep)  Answer Assessment - Initial Assessment Questions 1. LOCATION: "Where does it hurt?"      Lower midline 2. RADIATION: "Does the pain shoot anywhere else?" (e.g., chest, back)     no 3. ONSET: "When did the pain begin?" (e.g., minutes, hours or days ago)      3 days ago 4. SUDDEN: "Gradual or sudden onset?"     unsure 5. PATTERN "Does the pain come and go, or is it constant?"    - If constant: "Is it getting better, staying the same, or worsening?"      (Note: Constant means the pain never goes away completely; most serious pain is constant and it progresses)     - If intermittent: "How long does it last?" "Do you have pain now?"     (Note: Intermittent means the pain goes away completely between bouts)     Constant- worse 6. SEVERITY: "How bad is the pain?"  (e.g., Scale 1-10; mild, moderate, or severe)   - MILD (1-3): doesn't interfere with normal activities, abdomen soft and not tender to touch    - MODERATE (4-7): interferes with normal activities or awakens from sleep, abdomen tender to touch    - SEVERE (8-10): excruciating pain, doubled over, unable to do any normal activities      Moderate/severe 7. RECURRENT SYMPTOM: "Have you ever had this type of stomach pain before?" If Yes, ask: "When was the last time?" and "What happened that time?"      Yes- patient is pregannt 8. CAUSE: "What do you think is causing the stomach pain?"     *No Answer* 9. RELIEVING/AGGRAVATING FACTORS: "What makes it better or worse?" (e.g., movement, antacids, bowel movement)     *No Answer* 10. OTHER SYMPTOMS: "Do you have any other symptoms?" (e.g., back pain, diarrhea, fever, urination pain, vomiting)       *No Answer* 11. PREGNANCY: "Is there any chance you are pregnant?" "When was your last menstrual period?"       Yes- 27.[redacted] weeks pregnant  Protocols used: Abdominal Pain -  Female-A-AH, Pregnancy - Abdominal Pain Greater Than [redacted] Weeks EGA-A-AH

## 2021-07-01 ENCOUNTER — Telehealth: Payer: Self-pay | Admitting: Advanced Practice Midwife

## 2021-07-01 NOTE — Telephone Encounter (Signed)
Attempted to reach patient to discuss nurse line message regarding abdominal pain. VLTCB  Mallie Snooks, MSA, MSN, CNM Certified Nurse Midwife, [facility] [employer], [place] 07/01/21 6:09 PM

## 2021-07-02 ENCOUNTER — Other Ambulatory Visit: Payer: Self-pay

## 2021-07-02 ENCOUNTER — Ambulatory Visit (INDEPENDENT_AMBULATORY_CARE_PROVIDER_SITE_OTHER): Payer: Medicaid Other | Admitting: Licensed Clinical Social Worker

## 2021-07-02 ENCOUNTER — Ambulatory Visit (INDEPENDENT_AMBULATORY_CARE_PROVIDER_SITE_OTHER): Payer: Medicaid Other | Admitting: Advanced Practice Midwife

## 2021-07-02 VITALS — BP 103/63 | HR 104 | Temp 97.4°F | Wt 146.8 lb

## 2021-07-02 DIAGNOSIS — O99342 Other mental disorders complicating pregnancy, second trimester: Secondary | ICD-10-CM | POA: Diagnosis not present

## 2021-07-02 DIAGNOSIS — O26899 Other specified pregnancy related conditions, unspecified trimester: Secondary | ICD-10-CM

## 2021-07-02 DIAGNOSIS — Z3480 Encounter for supervision of other normal pregnancy, unspecified trimester: Secondary | ICD-10-CM | POA: Diagnosis not present

## 2021-07-02 DIAGNOSIS — Z3A27 27 weeks gestation of pregnancy: Secondary | ICD-10-CM

## 2021-07-02 DIAGNOSIS — F419 Anxiety disorder, unspecified: Secondary | ICD-10-CM | POA: Diagnosis not present

## 2021-07-02 DIAGNOSIS — R102 Pelvic and perineal pain: Secondary | ICD-10-CM

## 2021-07-02 NOTE — BH Specialist Note (Signed)
Integrated Behavioral Health Follow Up In-Person Visit  MRN: [id number] Name: Valerie Newman  Number of Integrated Behavioral Health Clinician visits: 3/6 Session Start time: 9:00am  Session End time: 9:40am Total time: 40 min in person at [facility]  Types of Service: Individual psychotherapy  Interpretor:No. Interpretor Name and Language: none  Subjective: Valerie Newman is a 21 y.o. female accompanied by n/a Patient was referred by Tyler Aas CNM for anxiety. Patient reports the following symptoms/concerns: depressed mood, family conflict, feeling on edge and overwhelmed, crying and lost of interest  Duration of problem: approx 6 months ; Severity of problem: mild  Objective: Mood: Depressed and Affect: Appropriate Risk of harm to self or others: No plan to harm self or others  Life Context: Family and Social: Lives in [place] with daughter  School/Work: n/a Self-Care: n/a Life Changes: New pregnancy  Patient and/or Family's Strengths/Protective Factors: Concrete supports in place (healthy food, safe environments, etc.)  Goals Addressed: Patient will:  Reduce symptoms of: anxiety   Increase knowledge and/or ability of: coping skills   Demonstrate ability to: Increase healthy adjustment to current life circumstances  Progress towards Goals: Ongoing  Interventions: Interventions utilized:  Mindfulness or Relaxation Training and Supportive Counseling Standardized Assessments completed: PHQ 9  Patient and/or Family Response: Ms. Mcglade responded well to [facility] appt  Assessment: Patient currently experiencing anxiety in pregnancy.   Patient may benefit from integrated behavioral health.  Plan: Follow up with behavioral health clinician on : 07/31/2021 Behavioral recommendations: Create routine schedule, attend scheduled medical appts, take prenatal vitamins, engage in self care to reduce stress Referral(s): Integrated [employer] (In Clinic) "From  scale of 1-10, how likely are you to follow plan?":    Gwyndolyn Saxon, LCSW

## 2021-07-02 NOTE — Progress Notes (Signed)
° °  PRENATAL VISIT NOTE  Subjective:  Valerie Newman is a 21 y.o. G2P1001 at [redacted]w[redacted]d being seen today for ongoing prenatal care.  She is currently monitored for the following issues for this low-risk pregnancy and has Supervision of other normal pregnancy, antepartum and Anxiety in pregnancy in second trimester, antepartum on their problem list.  Patient reports  recurrent pelvic and groin pain which worsens with standing, prolonged walking, position changes .  Pain is worse at night. She experienced the same symptoms with her previous baby. She prefers to avoid medication or maternity belt.  Contractions: Not present. Vag. Bleeding: None.  Movement: Present. Denies leaking of fluid.   The following portions of the patient's history were reviewed and updated as appropriate: allergies, current medications, past family history, past medical history, past social history, past surgical history and problem list. Problem list updated.  Objective:   Vitals:   07/02/21 0817  BP: 103/63  Pulse: (!) 104  Temp: (!) 97.4 F (36.3 C)  Weight: 146 lb 12.8 oz (66.6 kg)    Fetal Status: Fetal Heart Rate (bpm): 155 Fundal Height: 28 cm Movement: Present     General:  Alert, oriented and cooperative. Patient is in no acute distress.  Skin: Skin is warm and dry. No rash noted.   Cardiovascular: Normal heart rate noted  Respiratory: Normal respiratory effort, no problems with respiration noted  Abdomen: Soft, gravid, appropriate for gestational age.  Pain/Pressure: Absent     Pelvic: Cervical exam deferred        Extremities: Normal range of motion.  Edema: None  Mental Status: Normal mood and affect. Normal behavior. Normal judgment and thought content.   Assessment and Plan:  Pregnancy: G2P1001 at [redacted]w[redacted]d  1. Supervision of other normal pregnancy, antepartum - LOB, routine care - Preemptive teaching: changes to care plan if abnormal GTT result - Declined flu and TDAP today - Confirmed she is  planning IUD for pp contraception - Glucose Tolerance, 2 Hours w/1 Hour - HIV Antibody (routine testing w rflx) - RPR - CBC  2. [redacted] weeks gestation of pregnancy   3. Anxiety in pregnancy in second trimester, antepartum --Meeting with Seth Bake this morning  4. Pelvic pain affecting pregnancy, antepartum - Reviewed possible interventions including maternity belt, Tylenol, position changes (declined by patient) - Discussed BC contractions vs preterm labor vs round ligament and pelvic pain - Reviewed preterm labor precautions, indications for evaluation in MAU  Preterm labor symptoms and general obstetric precautions including but not limited to vaginal bleeding, contractions, leaking of fluid and fetal movement were reviewed in detail with the patient. Please refer to After Visit Summary for other counseling recommendations.  Return in about 2 weeks (around 07/16/2021).  Future Appointments  Date Time Provider [place]  07/02/2021  8:45 AM Lynnea Ferrier, LCSW [facility] None  07/31/2021  8:55 AM Laury Deep, CNM [facility] None  08/29/2021  8:55 AM Gavin Pound, CNM [facility] None  09/05/2021  9:15 AM Gabriel Carina, CNM [facility] None  09/10/2021 10:35 AM Gabriel Carina, CNM [facility] None  09/18/2021  8:55 AM Laury Deep, CNM [facility] None  09/25/2021  8:55 AM Laury Deep, [place] None    Darlina Rumpf, [place]

## 2021-07-03 LAB — CBC
Hematocrit: 25.2 % — ABNORMAL LOW (ref 34.0–46.6)
Hemoglobin: 8.2 g/dL — ABNORMAL LOW (ref 11.1–15.9)
MCH: 25.5 pg — ABNORMAL LOW (ref 26.6–33.0)
MCHC: 32.5 g/dL (ref 31.5–35.7)
MCV: 78 fL — ABNORMAL LOW (ref 79–97)
Platelets: 242 10*3/uL (ref 150–450)
RBC: 3.22 x10E6/uL — ABNORMAL LOW (ref 3.77–5.28)
RDW: 13.4 % (ref 11.7–15.4)
WBC: 7.2 10*3/uL (ref 3.4–10.8)

## 2021-07-03 LAB — RPR: RPR Ser Ql: NONREACTIVE

## 2021-07-03 LAB — HIV ANTIBODY (ROUTINE TESTING W REFLEX): HIV Screen 4th Generation wRfx: NONREACTIVE

## 2021-07-03 LAB — GLUCOSE TOLERANCE, 2 HOURS W/ 1HR
Glucose, 1 hour: 95 mg/dL (ref 70–179)
Glucose, 2 hour: 93 mg/dL (ref 70–152)
Glucose, Fasting: 77 mg/dL (ref 70–91)

## 2021-07-04 ENCOUNTER — Telehealth: Payer: Self-pay | Admitting: *Deleted

## 2021-07-04 ENCOUNTER — Other Ambulatory Visit: Payer: Self-pay | Admitting: *Deleted

## 2021-07-04 DIAGNOSIS — O99019 Anemia complicating pregnancy, unspecified trimester: Secondary | ICD-10-CM

## 2021-07-04 DIAGNOSIS — Z3480 Encounter for supervision of other normal pregnancy, unspecified trimester: Secondary | ICD-10-CM

## 2021-07-04 MED ORDER — FERROUS SULFATE 325 (65 FE) MG PO TABS: 325.0000 mg | ORAL_TABLET | ORAL | 3 refills | Status: DC

## 2021-07-04 MED ORDER — ASCORBIC ACID 500 MG PO TABS: 500.0000 mg | ORAL_TABLET | ORAL | 3 refills | Status: DC

## 2021-07-04 NOTE — Telephone Encounter (Signed)
Patient informed that her iron level is low. She will be scheduled for an iron infusion. Nurse will call patient with infusion appointment. She will also need to start iron tablets daily every other day. Advised to take medication with OJ. Patient stated she does not tolerate OJ. Will send in Vitamin C tablet.  Clovis Pu, RN

## 2021-07-04 NOTE — Telephone Encounter (Signed)
-----   Message from Darlina Rumpf, [place] sent at 07/03/2021  9:29 AM EST ----- Hemoglobin is 8.2! Please notify patient and coordinate iron infusion per [facility] policy. Thanks! SW

## 2021-07-08 ENCOUNTER — Encounter (HOSPITAL_COMMUNITY)
Admission: RE | Admit: 2021-07-08 | Discharge: 2021-07-08 | Disposition: A | Discharge status: Home / Self Care | Payer: Medicaid Other | Source: Ambulatory Visit | Attending: Family Medicine | Admitting: Family Medicine

## 2021-07-08 DIAGNOSIS — M545 Low back pain, unspecified: Secondary | ICD-10-CM | POA: Diagnosis not present

## 2021-07-08 DIAGNOSIS — R109 Unspecified abdominal pain: Secondary | ICD-10-CM | POA: Insufficient documentation

## 2021-07-08 DIAGNOSIS — Z3A29 29 weeks gestation of pregnancy: Secondary | ICD-10-CM | POA: Diagnosis not present

## 2021-07-08 DIAGNOSIS — O99019 Anemia complicating pregnancy, unspecified trimester: Secondary | ICD-10-CM

## 2021-07-08 DIAGNOSIS — O26893 Other specified pregnancy related conditions, third trimester: Secondary | ICD-10-CM | POA: Insufficient documentation

## 2021-07-08 DIAGNOSIS — Z3480 Encounter for supervision of other normal pregnancy, unspecified trimester: Secondary | ICD-10-CM

## 2021-07-08 MED ORDER — SODIUM CHLORIDE 0.9 % IV SOLN
300.0000 mg | INTRAVENOUS | Status: DC
  Administered 2021-07-08: 09:00:00 300 mg via INTRAVENOUS
  Filled 2021-07-08: qty 300

## 2021-07-09 ENCOUNTER — Telehealth: Payer: Self-pay | Admitting: Obstetrics and Gynecology

## 2021-07-09 NOTE — Telephone Encounter (Signed)
Patient called complaining about her pelvic and wanted to be seen. I offered her an appointment for tomorrow, but she refused.

## 2021-07-13 ENCOUNTER — Inpatient Hospital Stay (HOSPITAL_COMMUNITY)
Admission: AD | Admit: 2021-07-13 | Discharge: 2021-07-14 | Disposition: A | Discharge status: Home / Self Care | Payer: Medicaid Other | Attending: Obstetrics & Gynecology | Admitting: Obstetrics & Gynecology

## 2021-07-13 ENCOUNTER — Other Ambulatory Visit: Payer: Self-pay

## 2021-07-13 ENCOUNTER — Encounter (HOSPITAL_COMMUNITY): Payer: Self-pay | Admitting: Obstetrics & Gynecology

## 2021-07-13 DIAGNOSIS — R102 Pelvic and perineal pain: Secondary | ICD-10-CM | POA: Diagnosis not present

## 2021-07-13 DIAGNOSIS — Z3A29 29 weeks gestation of pregnancy: Secondary | ICD-10-CM | POA: Diagnosis not present

## 2021-07-13 DIAGNOSIS — O26893 Other specified pregnancy related conditions, third trimester: Secondary | ICD-10-CM | POA: Insufficient documentation

## 2021-07-13 DIAGNOSIS — O4700 False labor before 37 completed weeks of gestation, unspecified trimester: Secondary | ICD-10-CM | POA: Diagnosis not present

## 2021-07-13 MED ORDER — NIFEDIPINE 10 MG PO CAPS
10.0000 mg | ORAL_CAPSULE | ORAL | Status: DC | PRN
  Administered 2021-07-13 – 2021-07-14 (×2): 10 mg via ORAL
  Filled 2021-07-13 (×3): qty 1

## 2021-07-13 NOTE — MAU Note (Signed)
.  Valerie Newman is a 21 y.o. at [redacted]w[redacted]d here in MAU reporting: pelvic pain and pressure that gets worse when walking. Pain 9/10. The pain started three weeks ago, but has since gotten worse. Denies VB or LOF. Endorses +FM.  Onset of complaint: On-going Pain score: 9/10 There were no vitals filed for this visit.   FHT:134 bpm

## 2021-07-13 NOTE — MAU Provider Note (Signed)
Chief Complaint:  Pelvic Pain   Event Date/Time   First Provider Initiated Contact with Patient 07/13/21 2334     HPI: Valerie Newman is a 21 y.o. G2P1001 at 35w2dwho presents to maternity admissions reporting intermittent lower abdomen and low back pain. Does feel some tightening. . She reports good fetal movement, denies LOF, vaginal bleeding, vaginal itching/burning, urinary symptoms, h/a, dizziness, n/v, diarrhea, constipation or fever/chills.    Pelvic Pain The patient's primary symptoms include pelvic pain. The patient's pertinent negatives include no genital itching, genital lesions, genital odor or vaginal bleeding. This is a new problem. The current episode started 1 to 4 weeks ago. The problem has been unchanged. She is pregnant. Associated symptoms include abdominal pain and back pain. Pertinent negatives include no chills, diarrhea, fever, headaches, nausea or vomiting. Nothing aggravates the symptoms. She has tried nothing for the symptoms.   RN Note: Valerie Newman is a 21 y.o. at [redacted]w[redacted]d here in MAU reporting: pelvic pain and pressure that gets worse when walking. Pain 9/10. The pain started three weeks ago, but has since gotten worse. Denies VB or LOF. Endorses +FM.  Onset of complaint: On-going Pain score: 9/10 There were no vitals filed for this visit.   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   Allergy to pollen    Anemia    Anxiety    Asthma     Past obstetric history: OB History  Gravida Para Term Preterm AB Living  2 1 1  0 0 1  SAB IAB Ectopic Multiple Live Births  0 0 0 0 1    # Outcome Date GA Lbr Len/2nd Weight Sex Delivery Anes PTL Lv  2 Current           1 Term 11/03/17 [redacted]w[redacted]d 03:45 / 00:05 3165 g F Vag-Spont EPI  LIV    Past Surgical History: Past Surgical History:  Procedure Laterality Date   NO PAST SURGERIES     TOOTH EXTRACTION      Family History: Family History   Problem Relation Age of Onset   Heart disease Mother    Stroke Mother    Hypertension Maternal Grandmother    Diabetes Maternal Grandmother    Diabetes Maternal Grandfather    Diabetes Maternal Uncle    Diabetes Maternal Aunt     Social History: Social History   Tobacco Use   Smoking status: Never    Passive exposure: Never   Smokeless tobacco: Never  Vaping Use   Vaping Use: Never used  Substance Use Topics   Alcohol use: No   Drug use: No    Allergies:  Allergies  Allergen Reactions   Bee Venom Swelling   Metronidazole Nausea And Vomiting   Wasp Venom Swelling    Meds:  Medications Prior to Admission  Medication Sig Dispense Refill Last Dose   ascorbic acid (VITAMIN C) 500 MG tablet Take 1 tablet (500 mg total) by mouth every other day. Take with Iron Tablet 30 tablet 3 More than a month   Blood Pressure Monitoring (BLOOD PRESSURE MONITOR AUTOMAT) DEVI 1 Device by Does not apply route daily. Automatic blood pressure cuff regular size. To monitor blood pressure regularly at home. ICD-10 code:Z34.90 1 each 0    diphenhydrAMINE (BENADRYL) 25 MG tablet Take 25 mg by mouth every 6 (six) hours as needed.   More than a month   ferrous sulfate (FERROUSUL) 325 (65  FE) MG tablet Take 1 tablet (325 mg total) by mouth every other day. Take with Vitamin C tablete 30 tablet 3 More than a month   fluticasone (FLONASE) 50 MCG/ACT nasal spray Place 1 spray into both nostrils 2 (two) times daily. (Patient not taking: Reported on 07/02/2021) 16 g 0 More than a month   hydrOXYzine (ATARAX/VISTARIL) 25 MG tablet Take 1 tablet (25 mg total) by mouth at bedtime. (Patient not taking: Reported on 07/02/2021) 30 tablet 2 More than a month   Prenatal Vit-Fe Fumarate-FA (PRENATAL PLUS VITAMIN/MINERAL) 27-1 MG TABS Take 1 tablet by mouth daily at 6 (six) AM. (Patient not taking: Reported on 07/02/2021) 30 tablet 12 More than a month   terconazole (TERAZOL 7) 0.4 % vaginal cream Place 1 applicator  vaginally at bedtime. Use for seven days (Patient not taking: Reported on 07/02/2021) 45 g 0 More than a month    I have reviewed patient's Past Medical Hx, Surgical Hx, Family Hx, Social Hx, medications and allergies.   ROS:  Review of Systems  Constitutional:  Negative for chills and fever.  Gastrointestinal:  Positive for abdominal pain. Negative for diarrhea, nausea and vomiting.  Genitourinary:  Positive for pelvic pain.  Musculoskeletal:  Positive for back pain.  Neurological:  Negative for headaches.  Other systems negative  Physical Exam  Patient Vitals for the past 24 hrs:  BP Temp Temp src Pulse Resp SpO2 Height Weight  07/13/21 2313 111/74 -- -- 80 -- 100 % -- --  07/13/21 2304 121/68 98.7 F (37.1 C) Oral 79 16 100 % 5\' 3"  (1.6 m) 67.6 kg   Constitutional: Well-developed, well-nourished female in no acute distress.  Cardiovascular: normal rate and rhythm Respiratory: normal effort, clear to auscultation bilaterally GI: Abd soft, non-tender, gravid appropriate for gestational age.   No rebound or guarding. MS: Extremities nontender, no edema, normal ROM Neurologic: Alert and oriented x 4.  GU: Neg CVAT.  PELVIC EXAM:  Dilation: Closed Effacement (%): Thick Cervical Position: Posterior Station: Ballotable Exam by:: Juandiego Kolenovic cnm   FHT:  Baseline 140 , moderate variability, accelerations present, no decelerations Contractions: q 4 mins Irregular     Labs: Results for orders placed or performed during the hospital encounter of 07/13/21 (from the past 24 hour(s))  Fetal fibronectin     Status: None   Collection Time: 07/13/21 11:44 PM  Result Value Ref Range   Fetal Fibronectin NEGATIVE NEGATIVE    A/Positive/-- (09/27 0859)  Imaging:  No results found.  MAU Course/MDM: I have ordered labs and reviewed results. Fetal fibronectin is negative which is reassuring  NST reviewed, reassuring  Treatments in MAU included EFM, Tocolysis.    Assessment: Single  IUP at [redacted]w[redacted]d Preterm uterine contractions without cervical change and negative FFn  Plan: Discharge home Preterm Labor precautions and fetal kick counts Follow up in Office for prenatal visits and recheck Encouraged to return if she develops worsening of symptoms, increase in pain, fever, or other concerning symptoms.   Pt stable at time of discharge.  [redacted]w[redacted]d CNM, MSN Certified Nurse-Midwife 07/13/2021 11:34 PM

## 2021-07-14 DIAGNOSIS — O4700 False labor before 37 completed weeks of gestation, unspecified trimester: Secondary | ICD-10-CM

## 2021-07-14 DIAGNOSIS — Z3A29 29 weeks gestation of pregnancy: Secondary | ICD-10-CM | POA: Diagnosis not present

## 2021-07-14 LAB — FETAL FIBRONECTIN: Fetal Fibronectin: NEGATIVE

## 2021-07-15 ENCOUNTER — Inpatient Hospital Stay (HOSPITAL_COMMUNITY)
Admission: AD | Admit: 2021-07-15 | Discharge: 2021-07-15 | Disposition: A | Discharge status: Home / Self Care | Payer: Medicaid Other | Attending: Obstetrics and Gynecology | Admitting: Obstetrics and Gynecology

## 2021-07-15 ENCOUNTER — Encounter (HOSPITAL_COMMUNITY)
Admission: RE | Admit: 2021-07-15 | Discharge: 2021-07-15 | Disposition: A | Discharge status: Home / Self Care | Payer: Medicaid Other | Source: Ambulatory Visit | Attending: Family Medicine | Admitting: Family Medicine

## 2021-07-15 ENCOUNTER — Encounter (HOSPITAL_COMMUNITY): Payer: Self-pay | Admitting: Obstetrics and Gynecology

## 2021-07-15 ENCOUNTER — Other Ambulatory Visit: Payer: Self-pay

## 2021-07-15 DIAGNOSIS — Z3A29 29 weeks gestation of pregnancy: Secondary | ICD-10-CM | POA: Diagnosis not present

## 2021-07-15 DIAGNOSIS — O26893 Other specified pregnancy related conditions, third trimester: Secondary | ICD-10-CM | POA: Insufficient documentation

## 2021-07-15 DIAGNOSIS — Z881 Allergy status to other antibiotic agents status: Secondary | ICD-10-CM | POA: Insufficient documentation

## 2021-07-15 DIAGNOSIS — T8090XA Unspecified complication following infusion and therapeutic injection, initial encounter: Secondary | ICD-10-CM

## 2021-07-15 DIAGNOSIS — M549 Dorsalgia, unspecified: Secondary | ICD-10-CM | POA: Insufficient documentation

## 2021-07-15 DIAGNOSIS — Z3689 Encounter for other specified antenatal screening: Secondary | ICD-10-CM

## 2021-07-15 DIAGNOSIS — O99019 Anemia complicating pregnancy, unspecified trimester: Secondary | ICD-10-CM

## 2021-07-15 DIAGNOSIS — Z3480 Encounter for supervision of other normal pregnancy, unspecified trimester: Secondary | ICD-10-CM

## 2021-07-15 DIAGNOSIS — R109 Unspecified abdominal pain: Secondary | ICD-10-CM | POA: Insufficient documentation

## 2021-07-15 DIAGNOSIS — O99891 Other specified diseases and conditions complicating pregnancy: Secondary | ICD-10-CM

## 2021-07-15 MED ORDER — DIPHENHYDRAMINE HCL 50 MG/ML IJ SOLN
25.0000 mg | Freq: Once | INTRAMUSCULAR | Status: AC
  Administered 2021-07-15: 25 mg via INTRAVENOUS
  Filled 2021-07-15: qty 1

## 2021-07-15 MED ORDER — ACETAMINOPHEN 500 MG PO TABS
1000.0000 mg | ORAL_TABLET | Freq: Once | ORAL | Status: AC
  Administered 2021-07-15: 1000 mg via ORAL
  Filled 2021-07-15: qty 2

## 2021-07-15 MED ORDER — TERBUTALINE SULFATE 1 MG/ML IJ SOLN
0.2500 mg | Freq: Once | INTRAMUSCULAR | Status: AC
Start: 2021-07-15 — End: 2021-07-15
  Administered 2021-07-15: 0.25 mg via SUBCUTANEOUS
  Filled 2021-07-15: qty 1

## 2021-07-15 MED ORDER — SODIUM CHLORIDE 0.9 % IV SOLN
300.0000 mg | INTRAVENOUS | Status: AC
  Administered 2021-07-15: 300 mg via INTRAVENOUS
  Filled 2021-07-15: qty 15

## 2021-07-15 MED ORDER — LACTATED RINGERS IV BOLUS
1000.0000 mL | Freq: Once | INTRAVENOUS | Status: AC
  Administered 2021-07-15: 1000 mL via INTRAVENOUS

## 2021-07-15 NOTE — MAU Note (Signed)
Patient arrived to MAU from the transfusion center complaining of contraction and back pain that started at 0900am while she was getting an iron transfusion. Patient denies vaginal bleeding and or leakage of fluid. + FM reported.

## 2021-07-15 NOTE — Progress Notes (Signed)
Pt here today and received second dose of IV venofer.  Tolerated venofer well.  About 20 min post infusion pt stated she felt like she was having braxton hicks contractions and that the other day she had them and [facility]  I asked if they felt the same today as the day she had to be [facility] and did she want to be taken there for evaluation and she said yes.  Took  patient to MAU in wheelchair.

## 2021-07-15 NOTE — MAU Provider Note (Signed)
History     CSN: [id number]  Arrival date and time: 07/15/21 1055   Event Date/Time   First Provider Initiated Contact with Patient 07/15/21 1113      Chief Complaint  Patient presents with   Contractions   Back Pain   HPI Valerie Newman is a 21 y.o. G2P1001 at [redacted]w[redacted]d who presents to MAU with chief complaint of contractions and back pain. Patient was at the infusion center receiving iv iron earlier today. She states her back pain began "when the iron bag was mostly full". On arrival to MAU she reports bilateral low back pain 9/10. She denies aggravating or alleviating factors. She has not taken medication or tried other treatments for this complaint.  Patient's lower abdominal contractions began several weeks ago and have waxed and waned since onset. She was most recently evaluated in MAU for this complaint on 07/13/2021. Patient states she felt fine at discharge from that encounter but experienced a recurrence of painful contractions this morning at 0900.  She denies vaginal bleeding, decreased fetal movement, dysuria, fever or recent illness.  Patient receives care with [facility].  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   Allergy to pollen    Anemia    Anxiety    Asthma     Past Surgical History:  Procedure Laterality Date   NO PAST SURGERIES     TOOTH EXTRACTION      Family History  Problem Relation Age of Onset   Heart disease Mother    Stroke Mother    Hypertension Maternal Grandmother    Diabetes Maternal Grandmother    Diabetes Maternal Grandfather    Diabetes Maternal Uncle    Diabetes Maternal Aunt     Social History   Tobacco Use   Smoking status: Never    Passive exposure: Never   Smokeless tobacco: Never  Vaping Use   Vaping  Use: Never used  Substance Use Topics   Alcohol use: No   Drug use: No    Allergies:  Allergies  Allergen Reactions   Bee Venom Swelling   Metronidazole Nausea And Vomiting   Wasp Venom Swelling    Medications Prior to Admission  Medication Sig Dispense Refill Last Dose   ascorbic acid (VITAMIN C) 500 MG tablet Take 1 tablet (500 mg total) by mouth every other day. Take with Iron Tablet 30 tablet 3    Blood Pressure Monitoring (BLOOD PRESSURE MONITOR AUTOMAT) DEVI 1 Device by Does not apply route daily. Automatic blood pressure cuff regular size. To monitor blood pressure regularly at home. ICD-10 code:Z34.90 1 each 0    diphenhydrAMINE (BENADRYL) 25 MG tablet Take 25 mg by mouth every 6 (six) hours as needed.      ferrous sulfate (FERROUSUL) 325 (65 FE) MG tablet Take 1 tablet (325 mg total) by mouth every other day. Take with Vitamin C tablete 30 tablet 3    fluticasone (FLONASE) 50 MCG/ACT nasal spray Place 1 spray into both nostrils 2 (two) times daily. (Patient not  taking: Reported on 07/02/2021) 16 g 0    hydrOXYzine (ATARAX/VISTARIL) 25 MG tablet Take 1 tablet (25 mg total) by mouth at bedtime. (Patient not taking: Reported on 07/02/2021) 30 tablet 2    Prenatal Vit-Fe Fumarate-FA (PRENATAL PLUS VITAMIN/MINERAL) 27-1 MG TABS Take 1 tablet by mouth daily at 6 (six) AM. (Patient not taking: Reported on 07/02/2021) 30 tablet 12    terconazole (TERAZOL 7) 0.4 % vaginal cream Place 1 applicator vaginally at bedtime. Use for seven days (Patient not taking: Reported on 07/02/2021) 45 g 0     Review of Systems  Gastrointestinal:  Positive for abdominal pain.  Musculoskeletal:  Positive for back pain.  All other systems reviewed and are negative. Physical Exam   Blood pressure 108/68, pulse 79, temperature 98.1 F (36.7 C), temperature source Oral, resp. rate 18, last menstrual period 12/20/2020, SpO2 100 %.  Physical Exam Vitals and nursing note reviewed. Exam conducted with a  chaperone present.  Constitutional:      Appearance: Normal appearance.  Cardiovascular:     Rate and Rhythm: Normal rate and regular rhythm.     Pulses: Normal pulses.     Heart sounds: Normal heart sounds.  Pulmonary:     Effort: Pulmonary effort is normal.     Breath sounds: Normal breath sounds.  Abdominal:     Palpations: Abdomen is soft.     Tenderness: There is no abdominal tenderness.     Comments: Gravid  Skin:    Capillary Refill: Capillary refill takes less than 2 seconds.  Neurological:     Mental Status: She is alert and oriented to person, place, and time.  Psychiatric:        Mood and Affect: Mood normal.        Behavior: Behavior normal.        Thought Content: Thought content normal.        Judgment: Judgment normal.    MAU Course  Procedures  --Neg FFN on 07/13/2021. Given reassuring toco, absence of bleeding, ob history, will defer pelvic --OB history term SVD x 1 in 2019 --BSUS utilized for application of EFM --Reactive tracing: baseline 150, mod var, + accels, no decels --Toco: irregular contractions q 5 min on arrival, resolved with fluids and terb x 1 --12:25pm: CNM returned to bedside. Patient sleeping. Denies pain. Verbalizes to CNM she is comfortable going home --Patient reports back started shortly after iron infusion. Infusion RN note suggests patient had completed infusion and reported return of contractions. Patient expresses concern for allergic reaction to Fe. Reassurance provided. Will add to problem list.  Patient Vitals for the past 24 hrs:  BP Temp Temp src Pulse Resp SpO2  07/15/21 1321 (!) 98/59 98.1 F (36.7 C) -- 98 16 99 %  07/15/21 1300 (!) 98/59 97.9 F (36.6 C) Oral 98 16 99 %  07/15/21 1255 -- -- -- -- -- 100 %  07/15/21 1250 -- -- -- -- -- 100 %  07/15/21 1245 -- -- -- -- -- 100 %  07/15/21 1240 -- -- -- -- -- 100 %  07/15/21 1235 -- -- -- -- -- 100 %  07/15/21 1230 -- -- -- -- -- 100 %  07/15/21 1225 -- -- -- -- -- 99 %   07/15/21 1220 -- -- -- -- -- 98 %  07/15/21 1200 -- -- -- -- -- 100 %  07/15/21 1155 -- -- -- -- -- 100 %  07/15/21 1150 -- -- -- -- -- 100 %  07/15/21 1145 -- -- -- -- -- 100 %  07/15/21 1140 -- -- -- -- -- 99 %  07/15/21 1135 -- -- -- -- -- 99 %  07/15/21 1130 -- -- -- -- -- 100 %  07/15/21 1125 -- -- -- -- -- 100 %  07/15/21 1120 -- -- -- -- -- 100 %  07/15/21 1118 108/68 98.1 F (36.7 C) Oral 79 18 100 %  07/15/21 1115 108/68 -- -- 79 -- 100 %   Meds ordered this encounter  Medications   lactated ringers bolus 1,000 mL   diphenhydrAMINE (BENADRYL) injection 25 mg    New onset back pain during iron infusion   acetaminophen (TYLENOL) tablet 1,000 mg   terbutaline (BRETHINE) injection 0.25 mg   Assessment and Plan  --21 y.o. G2P1001 at [redacted]w[redacted]d  --Back and abdominal pain in third trimester --No residual complaints related to IV Fe --Pain resolved with treatments given in MAU --Discharge home in stable condition  F/U: --Next appointment with [facility] is 07/17/2021  Calvert Cantor, MSA, MSN, CNM 07/15/2021, 3:46 PM

## 2021-07-17 ENCOUNTER — Other Ambulatory Visit: Payer: Self-pay

## 2021-07-17 ENCOUNTER — Ambulatory Visit (INDEPENDENT_AMBULATORY_CARE_PROVIDER_SITE_OTHER): Payer: Medicaid Other | Admitting: Obstetrics and Gynecology

## 2021-07-17 ENCOUNTER — Encounter: Payer: Self-pay | Admitting: Obstetrics and Gynecology

## 2021-07-17 VITALS — BP 119/72 | HR 86 | Wt 147.0 lb

## 2021-07-17 DIAGNOSIS — O99019 Anemia complicating pregnancy, unspecified trimester: Secondary | ICD-10-CM

## 2021-07-17 DIAGNOSIS — Z3480 Encounter for supervision of other normal pregnancy, unspecified trimester: Secondary | ICD-10-CM

## 2021-07-17 DIAGNOSIS — Z3A29 29 weeks gestation of pregnancy: Secondary | ICD-10-CM

## 2021-07-17 NOTE — Progress Notes (Signed)
° °  LOW-RISK PREGNANCY OFFICE VISIT Patient name: Valerie Newman MRN [id number]  Date of birth: 19-Jul-2000 Chief Complaint:   Routine Prenatal Visit  History of Present Illness:   Valerie Newman is a 21 y.o. G70P1001 female at [redacted]w[redacted]d with an Estimated Date of Delivery: 09/26/21 being seen today for ongoing management of a low-risk pregnancy.  Today she reports no complaints. "I want to be induced at 38 weeks. I'm tired of carrying her around." Contractions: Irritability. Vag. Bleeding: None.  Movement: Present. denies leaking of fluid. Review of Systems:   Pertinent items are noted in HPI Denies abnormal vaginal discharge w/ itching/odor/irritation, headaches, visual changes, shortness of breath, chest pain, abdominal pain, severe nausea/vomiting, or problems with urination or bowel movements unless otherwise stated above. Pertinent History Reviewed:  Reviewed past medical,surgical, social, obstetrical and family history.  Reviewed problem list, medications and allergies. Physical Assessment:   Vitals:   07/17/21 0914  BP: 119/72  Pulse: 86  Weight: 147 lb (66.7 kg)  Body mass index is 26.04 kg/m.        Physical Examination:   General appearance: Well appearing, and in no distress  Mental status: Alert, oriented to person, place, and time  Skin: Warm & dry  Cardiovascular: Normal heart rate noted  Respiratory: Normal respiratory effort, no distress  Abdomen: Soft, gravid, nontender  Pelvic: Cervical exam deferred         Extremities: Edema: Trace  Fetal Status: Fetal Heart Rate (bpm): 158   Movement: Present    No results found for this or any previous visit (from the past 24 hour(s)).  Assessment & Plan:  1) Low-risk pregnancy G2P1001 at [redacted]w[redacted]d with an Estimated Date of Delivery: 09/26/21   2) Supervision of other normal pregnancy, antepartum - Discussed can only be considered for IOL at 88 weeks with a favorable cervix - No contractions -- no cervical exam today  3) Anemia  of mother in pregnancy, antepartum - Not taking FeSO4 supplements  4) [redacted] weeks gestation of pregnancy   Meds: No orders of the defined types were placed in this encounter.  Labs/procedures today: none  Plan:  Continue routine obstetrical care   Reviewed: Preterm labor symptoms and general obstetric precautions including but not limited to vaginal bleeding, contractions, leaking of fluid and fetal movement were reviewed in detail with the patient.  All questions were answered. Has home bp cuff. Check bp weekly, let us know if >140/90.   Follow-up: No follow-ups on file.  No orders of the defined types were placed in this encounter.  Laury Deep MSN, CNM 07/17/2021 9:36 AM

## 2021-07-22 ENCOUNTER — Other Ambulatory Visit: Payer: Self-pay

## 2021-07-22 ENCOUNTER — Encounter (HOSPITAL_COMMUNITY): Payer: Self-pay

## 2021-07-22 ENCOUNTER — Ambulatory Visit (HOSPITAL_COMMUNITY)
Admission: EM | Admit: 2021-07-22 | Discharge: 2021-07-22 | Disposition: A | Discharge status: Home / Self Care | Payer: Medicaid Other | Attending: Student | Admitting: Student

## 2021-07-22 DIAGNOSIS — Z3A30 30 weeks gestation of pregnancy: Secondary | ICD-10-CM | POA: Diagnosis not present

## 2021-07-22 DIAGNOSIS — O2693 Pregnancy related conditions, unspecified, third trimester: Secondary | ICD-10-CM

## 2021-07-22 DIAGNOSIS — R519 Headache, unspecified: Secondary | ICD-10-CM | POA: Diagnosis not present

## 2021-07-22 LAB — POCT URINALYSIS DIPSTICK, ED / UC
Bilirubin Urine: NEGATIVE
Glucose, UA: NEGATIVE mg/dL
Hgb urine dipstick: NEGATIVE
Ketones, ur: NEGATIVE mg/dL
Nitrite: NEGATIVE
Protein, ur: 30 mg/dL — AB
Specific Gravity, Urine: 1.02 (ref 1.005–1.030)
Urobilinogen, UA: 1 mg/dL (ref 0.0–1.0)
pH: 7.5 (ref 5.0–8.0)

## 2021-07-22 MED ORDER — PREDNISONE 20 MG PO TABS
20.0000 mg | ORAL_TABLET | Freq: Every day | ORAL | 0 refills | Status: AC
Start: 2021-07-22 — End: 2021-07-25

## 2021-07-22 NOTE — Discharge Instructions (Addendum)
-  Prednisone one pill daily x3 days. Take with food earlier in the day as this can give you energy. -Tylenol 1000mg  up to 3x daily -Avoid ibuprofen and NSAIDs -Call your OB tomorrow if symptoms persist, or head to the ED if worst headache of your life, vision changes, etc.

## 2021-07-22 NOTE — ED Provider Notes (Signed)
[facility]    CSN: [id number] Arrival date & time: 07/22/21  1448      History   Chief Complaint Chief Complaint  Patient presents with   Migraine    HPI Valerie Newman is a 21 y.o. female presenting with migraine and lightheadedness. Requires work note.  History pregnancy, asthma.  States she has had headaches in the past, but different than this.  Describes throbbing headache with some lightheadedness on standing for about 3 days.  States it "feels like someone is punching me in the head".  Denies ear pain, sensation of room spinning.  Denies vision changes, photophobia, phonophobia, nausea, vomiting, diarrhea, abdominal pain, chest pain, shortness of breath.  Has attempted various over-the-counter medications for her symptoms, she is aware that she should only take Tylenol.  It appears that she was admitted for an infusion reaction on 07/15/2021.  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   Allergy to pollen    Anemia    Anxiety    Asthma     Patient Active Problem List   Diagnosis Date Noted   Infusion reaction 07/15/2021   Anxiety in pregnancy in second trimester, antepartum 05/06/2021   Supervision of other normal pregnancy, antepartum 03/11/2021    Past Surgical History:  Procedure Laterality Date   NO PAST SURGERIES     TOOTH EXTRACTION      OB History     Gravida  2   Para  1   Term  1   Preterm  0   AB  0   Living  1      SAB  0   IAB  0   Ectopic  0   Multiple  0   Live Births  1            Home Medications    Prior to Admission medications   Medication Sig Start Date End Date Taking? Authorizing Provider  predniSONE (DELTASONE) 20 MG tablet Take 1 tablet (20 mg total) by mouth daily for 3 days. 07/22/21 07/25/21 Yes Rhys Martini, PA-C  Blood Pressure Monitoring (BLOOD PRESSURE MONITOR AUTOMAT) DEVI 1 Device by Does not apply route daily. Automatic blood pressure cuff regular  size. To monitor blood pressure regularly at home. ICD-10 code:Z34.90 03/11/21   Raelyn Mora, CNM    Family History Family History  Problem Relation Age of Onset   Heart disease Mother    Stroke Mother    Hypertension Maternal Grandmother    Diabetes Maternal Grandmother    Diabetes Maternal Grandfather    Diabetes Maternal Uncle    Diabetes Maternal Aunt     Social History Social History   Tobacco Use   Smoking status: Never    Passive exposure: Never   Smokeless tobacco: Never  Vaping Use   Vaping Use: Never used  Substance Use Topics   Alcohol use: No   Drug use: No     Allergies   Bee venom, Metronidazole, and Wasp venom   Review of Systems Review of Systems  Neurological:  Positive for light-headedness and headaches.  All other systems reviewed and are negative.   Physical Exam Triage Vital Signs ED Triage Vitals  Enc Vitals Group     BP 07/22/21 1606 102/64     Pulse Rate 07/22/21 1606 85     Resp 07/22/21 1606 18     Temp 07/22/21 1606 98 F (36.7 C)  Temp Source 07/22/21 1606 Oral     SpO2 07/22/21 1606 98 %     Weight --      Height --      Head Circumference --      Peak Flow --      Pain Score 07/22/21 1605 8     Pain Loc --      Pain Edu? --      Excl. in GC? --    No data found.  Updated Vital Signs BP 102/64 (BP Location: Left Arm)    Pulse 85    Temp 98 F (36.7 C) (Oral)    Resp 18    LMP 12/20/2020    SpO2 98%   Visual Acuity Right Eye Distance:   Left Eye Distance:   Bilateral Distance:    Right Eye Near:   Left Eye Near:    Bilateral Near:     Physical Exam Vitals reviewed.  Constitutional:      General: She is not in acute distress.    Appearance: Normal appearance. She is not ill-appearing.  HENT:     Head: Normocephalic and atraumatic.  Eyes:     Extraocular Movements: Extraocular movements intact.     Pupils: Pupils are equal, round, and reactive to light.  Cardiovascular:     Rate and Rhythm: Normal  rate and regular rhythm.     Heart sounds: Normal heart sounds.  Pulmonary:     Effort: Pulmonary effort is normal.     Breath sounds: Normal breath sounds. No wheezing, rhonchi or rales.  Abdominal:     Comments: Pregnant   Musculoskeletal:     Cervical back: Normal range of motion and neck supple. No rigidity.  Lymphadenopathy:     Cervical: No cervical adenopathy.  Skin:    Capillary Refill: Capillary refill takes less than 2 seconds.  Neurological:     General: No focal deficit present.     Mental Status: She is alert and oriented to person, place, and time. Mental status is at baseline.     Cranial Nerves: No cranial nerve deficit or facial asymmetry.     Sensory: Sensation is intact. No sensory deficit.     Motor: Motor function is intact. No weakness.     Coordination: Coordination is intact. Coordination normal.     Gait: Gait is intact. Gait normal.     Comments: CN 2-12 intact. No weakness or numbness in UEs or LEs. PERRLA, EOMI. Strength and sensation grossly intact upper and lower extremities.   Psychiatric:        Mood and Affect: Mood normal.        Behavior: Behavior normal.        Thought Content: Thought content normal.        Judgment: Judgment normal.     UC Treatments / Results  Labs (all labs ordered are listed, but only abnormal results are displayed) Labs Reviewed  POCT URINALYSIS DIPSTICK, ED / UC - Abnormal; Notable for the following components:      Result Value   Protein, ur 30 (*)    Leukocytes,Ua SMALL (*)    All other components within normal limits    EKG   Radiology No results found.  Procedures Procedures (including critical care time)  Medications Ordered in UC Medications - No data to display  Initial Impression / Assessment and Plan / UC Course  I have reviewed the triage vital signs and the nursing notes.  Pertinent labs & imaging  results that were available during my care of the patient were reviewed by me and considered in  my medical decision making (see chart for details).     This patient is a very pleasant 21 y.o. year old female presenting with bad headache.  She is [redacted] weeks pregnant.  Reassuring neuro exam.  Low concern for preeclampsia given blood pressure in the low-normal range at 102/64 and UA with small leuk, did not send culture..   Trial of prednisone 20mg  x3 days. Work note x2 days provided.  Follow-up with GYN if symptoms persist, and strict ED return precautions discussed. Patient verbalizes understanding and agreement.   Discussed treatment plan with attending physician Dr. Tracie Harrier who is in agreement.   Final Clinical Impressions(s) / UC Diagnoses   Final diagnoses:  Bad headache  [redacted] weeks gestation of pregnancy     Discharge Instructions      -Prednisone one pill daily x3 days. Take with food earlier in the day as this can give you energy. -Tylenol 1000mg  up to 3x daily -Avoid ibuprofen and NSAIDs -Call your OB tomorrow if symptoms persist, or head to the ED if worst headache of your life, vision changes, etc.      ED Prescriptions     Medication Sig Dispense Auth. Provider   predniSONE (DELTASONE) 20 MG tablet Take 1 tablet (20 mg total) by mouth daily for 3 days. 3 tablet Rhys Martini, PA-C      PDMP not reviewed this encounter.   Rhys Martini, PA-C 07/22/21 1643

## 2021-07-22 NOTE — ED Triage Notes (Signed)
Pt presents with ongoing migraine with some dizziness X 3 days.

## 2021-07-31 ENCOUNTER — Other Ambulatory Visit: Payer: Self-pay

## 2021-07-31 ENCOUNTER — Encounter: Payer: Self-pay | Admitting: Obstetrics and Gynecology

## 2021-07-31 ENCOUNTER — Telehealth (INDEPENDENT_AMBULATORY_CARE_PROVIDER_SITE_OTHER): Payer: Medicaid Other | Admitting: Obstetrics and Gynecology

## 2021-07-31 ENCOUNTER — Ambulatory Visit: Payer: Medicaid Other | Admitting: Licensed Clinical Social Worker

## 2021-07-31 VITALS — BP 101/64 | HR 86

## 2021-07-31 DIAGNOSIS — Z3480 Encounter for supervision of other normal pregnancy, unspecified trimester: Secondary | ICD-10-CM

## 2021-07-31 DIAGNOSIS — O99340 Other mental disorders complicating pregnancy, unspecified trimester: Secondary | ICD-10-CM

## 2021-07-31 DIAGNOSIS — Z3A31 31 weeks gestation of pregnancy: Secondary | ICD-10-CM

## 2021-07-31 DIAGNOSIS — F419 Anxiety disorder, unspecified: Secondary | ICD-10-CM

## 2021-07-31 DIAGNOSIS — O99013 Anemia complicating pregnancy, third trimester: Secondary | ICD-10-CM

## 2021-07-31 DIAGNOSIS — O99019 Anemia complicating pregnancy, unspecified trimester: Secondary | ICD-10-CM

## 2021-07-31 DIAGNOSIS — O99343 Other mental disorders complicating pregnancy, third trimester: Secondary | ICD-10-CM

## 2021-07-31 NOTE — Patient Instructions (Signed)
Group B Streptococcus Test During Pregnancy °Why am I having this test? °Routine testing, also called screening, for group B streptococcus (GBS) is recommended for all pregnant women between the 36th and 37th week of pregnancy. GBS is a type of bacteria that can be passed from mother to baby during childbirth. Screening will help guide whether or not you will need treatment during labor and delivery to prevent complications such as: °An infection in your uterus during labor. °An infection in your uterus after delivery. °A serious infection in your baby after delivery, such as pneumonia, meningitis, or sepsis. °GBS screening is not often done before 36 weeks of pregnancy unless you go into labor prematurely. °What happens if I have group B streptococcus? °If testing shows that you have GBS, your health care provider will recommend treatment with IV antibiotics during labor and delivery. This treatment significantly decreases the risk of complications for you and your baby. °If you have a planned C-section and you have GBS, you may not need to be treated with antibiotics because GBS is usually passed to babies after labor starts and your water breaks. If you are in labor or your water breaks before your C-section, it is possible for GBS to get into your uterus and be passed to your baby, so you might need treatment. °Is there a chance I may not need to be tested? °You may not need to be tested for GBS if: °You have a urine test that shows GBS before 36 to 37 weeks. °You had a baby with GBS infection after a previous delivery. °In these cases, you will automatically be treated for GBS during labor and delivery. °What is being tested? °This test is done to check if you have group B streptococcus in your vagina or rectum. °What kind of sample is taken? °To collect samples for this test, your health care provider will swab your vagina and rectum with a cotton swab. The sample is then sent to the lab to see if GBS is  present. °What happens during the test? ° °You will remove your clothing from the waist down. °You will lie down on an exam table in the same position as you would for a pelvic exam. °Your health care provider will swab your vagina and rectum to collect samples for a culture test. °You will be able to go home after the test and do all your usual activities. °How are the results reported? °The test results are reported as positive or negative. °What do the results mean? °A positive test means you are at risk for passing GBS to your baby during labor and delivery. Your health care provider will recommend that you are treated with an IV antibiotic during labor and delivery. °A negative test means you are at very low risk of passing GBS to your baby. There is still a low risk of passing GBS to your baby because sometimes test results may report that you do not have a condition when you do (false-negative result) or there is a chance that you may become infected with GBS after the test is done. You most likely will not need to be treated with an antibiotic during labor and delivery. °Talk with your health care provider about what your results mean. °Questions to ask your health care provider °Ask your health care provider, or the department that is doing the test: °When will my results be ready? °How will I get my results? °What are my treatment options? °Summary °Routine testing (screening) for   group B streptococcus (GBS) is recommended for all pregnant women between the 36th and 37th week of pregnancy. °GBS is a type of bacteria that can be passed from mother to baby during childbirth. °If testing shows that you have GBS, your health care provider will recommend that you are treated with IV antibiotics during labor and delivery. This treatment almost always prevents infection in newborns. °This information is not intended to replace advice given to you by your health care provider. Make sure you discuss any questions  you have with your health care provider. °Document Revised: 04/02/2020 Document Reviewed: 06/29/2018 °Elsevier Patient Education © 2022 Elsevier Inc. ° °

## 2021-07-31 NOTE — Progress Notes (Addendum)
MY CHART VIDEO VIRTUAL OBSTETRICS VISIT ENCOUNTER NOTE  I connected with Valerie Newman on 07/31/21 at  8:55 AM EST by My Chart video at home and verified that I am speaking with the correct person using two identifiers. Provider located at [facility] for [employer] at [place].   I discussed the limitations, risks, security and privacy concerns of performing an evaluation and management service by My Chart video and the availability of in person appointments. I also discussed with the patient that there may be a patient responsible charge related to this service. The patient expressed understanding and agreed to proceed.  I discussed the limitations of telemedicine and the availability of in person appointments.  Discussed there is a possibility of technology failure and discussed alternative modes of communication if that failure occurs.  Subjective:  Valerie Newman is a 21 y.o. G2P1001 at [redacted]w[redacted]d being followed for ongoing prenatal care.  She is currently monitored for the following issues for this low-risk pregnancy and has Supervision of other normal pregnancy, antepartum; Anxiety in pregnancy in second trimester, antepartum; and Infusion reaction on their problem list.  Patient reports no complaints. Patient wanting to get her IOL at 39 weeks scheduled at today's visit. Reports fetal movement. Denies any contractions, bleeding or leaking of fluid.   The following portions of the patient's history were reviewed and updated as appropriate: allergies, current medications, past family history, past medical history, past social history, past surgical history and problem list.   Objective:   General:  Alert, oriented and cooperative.   Mental Status: Normal mood and affect perceived. Normal judgment and thought content.  Rest of physical exam deferred due to type of encounter  BP 101/64    Pulse 86    LMP 12/20/2020  **Done by patient's own at home BP cuff and scale  Assessment and  Plan:  Pregnancy: G2P1001 at [redacted]w[redacted]d  1. Supervision of other normal pregnancy, antepartum - Advised that IOL are scheduled 2 weeks in advanced.  - Will need to reassess for IOL @ 37 weeks - Anticipatory guidance for GBS screening at 36 wks. Explained the test is important to be done at this time in pregnancy to ensure adequate treatment at the time of delivery. Explained that a positive result does not mean any harm to her, but can be harmful to the baby. Meaning that if baby is exposed to the bacteria for too long without antibiotics, the baby has the potential to develop pneumonia, septicemia, or spinal meningitis and could end up in the NICU. Also, explained that a cervical exam may be performed at the time of testing to get a baseline cervical check and make sure there is no preterm cervical dilation.   2. Anemia of mother in pregnancy, antepartum  3. Anxiety disorder affecting pregnancy, antepartum - Virtual appointment scheduled with Gwyndolyn Saxon, LCSW at 0915  4. [redacted] weeks gestation of pregnancy   Preterm labor symptoms and general obstetric precautions including but not limited to vaginal bleeding, contractions, leaking of fluid and fetal movement were reviewed in detail with the patient.  I discussed the assessment and treatment plan with the patient. The patient was provided an opportunity to ask questions and all were answered. The patient agreed with the plan and demonstrated an understanding of the instructions. The patient was advised to call back or seek an in-person office evaluation/go to MAU at [facility] for any urgent or concerning symptoms. Please refer to After Visit Summary for other counseling recommendations.  I provided 5 minutes of non-face-to-face time during this encounter. There was 5 minutes of chart review time spent prior to this encounter. Total time spent = 10 minutes.  Return in about 4 weeks (around 08/28/2021) for Return OB w/GBS.  Future  Appointments  Date Time Provider Department Center  08/28/2021 10:35 AM Anyanwu, Jethro Bastos, MD [facility]    Raelyn Mora, CNM Center for [employer], [facility]

## 2021-08-15 ENCOUNTER — Ambulatory Visit (INDEPENDENT_AMBULATORY_CARE_PROVIDER_SITE_OTHER): Payer: Medicaid Other | Admitting: Licensed Clinical Social Worker

## 2021-08-15 DIAGNOSIS — O99340 Other mental disorders complicating pregnancy, unspecified trimester: Secondary | ICD-10-CM | POA: Diagnosis not present

## 2021-08-15 DIAGNOSIS — F419 Anxiety disorder, unspecified: Secondary | ICD-10-CM | POA: Diagnosis not present

## 2021-08-15 NOTE — BH Specialist Note (Signed)
Integrated Behavioral Health via Telemedicine Visit ? ?08/18/2021 ?Valerie Newman ?[id number] ? ?Number of Integrated Behavioral Health Clinician visits: 4 ?Session Start time:  936am ?Session End time: 954am ?Total time in minutes: 18 mins via phone per pt request  ? ?Referring Provider: Carloyn Jaeger CNM ?Patient/Family location: Home ?[facility] Provider location: [facility] ?All persons participating in visit: Pt N Kopplin and LCSW A. Felton Clinton  ?Types of Service: Individual psychotherapy and Telephone visit ? ?I connected with Valerie Newman and/or Valerie Newman n/a via  Telephone or Engineer, civil (consulting)  (Video is Surveyor, mining) and verified that I am speaking with the correct person using two identifiers. Discussed confidentiality: Yes  ? ?I discussed the limitations of telemedicine and the availability of in person appointments.  Discussed there is a possibility of technology failure and discussed alternative modes of communication if that failure occurs. ? ?I discussed that engaging in this telemedicine visit, they consent to the provision of behavioral healthcare and the services will be billed under their insurance. ? ?Patient and/or legal guardian expressed understanding and consented to Telemedicine visit: Yes  ? ?Presenting Concerns: ?Patient and/or family reports the following symptoms/concerns: anxiety in pregnancy ?Duration of problem: approx 8 months; Severity of problem: mild ? ?Patient and/or Family's Strengths/Protective Factors: ?Concrete supports in place (healthy food, safe environments, etc.) ? ?Goals Addressed: ?Patient will: ? Reduce symptoms of: anxiety  ? Increase knowledge and/or ability of: coping skills  ? Demonstrate ability to: Increase adequate support systems for patient/family ? ?Progress towards Goals: ?Ongoing ? ?Interventions: ?Interventions utilized:  Supportive Counseling ?Standardized Assessments completed: PHQ 9 ? ?Patient and/or Family Response: Valerie Newman  responded well to scheduled visit. ? ?Assessment: ?Patient currently experiencing anxiety affecting pregnancy. Valerie Newman reports improvements in her mood and symptoms.  ? ?Patient may benefit from integrated behavioral health. ? ?Plan: ?Follow up with behavioral health clinician on : 4 weeks via mychart  ?Behavioral recommendations: Continue to prioritize self care, rest and healthy diet. Communicate needs with supportive family for additional support ?Referral(s): Integrated [employer] (In Clinic) ? ?I discussed the assessment and treatment plan with the patient and/or parent/guardian. They were provided an opportunity to ask questions and all were answered. They agreed with the plan and demonstrated an understanding of the instructions. ?  ?They were advised to call back or seek an in-person evaluation if the symptoms worsen or if the condition fails to improve as anticipated. ? ?Gwyndolyn Saxon, LCSW ? ?

## 2021-08-19 ENCOUNTER — Inpatient Hospital Stay (HOSPITAL_COMMUNITY)
Admission: AD | Admit: 2021-08-19 | Discharge: 2021-08-19 | Disposition: A | Discharge status: Home / Self Care | Payer: Medicaid Other | Attending: Obstetrics & Gynecology | Admitting: Obstetrics & Gynecology

## 2021-08-19 ENCOUNTER — Other Ambulatory Visit: Payer: Self-pay

## 2021-08-19 DIAGNOSIS — R102 Pelvic and perineal pain: Secondary | ICD-10-CM | POA: Insufficient documentation

## 2021-08-19 DIAGNOSIS — Z3689 Encounter for other specified antenatal screening: Secondary | ICD-10-CM | POA: Diagnosis not present

## 2021-08-19 DIAGNOSIS — Z3A34 34 weeks gestation of pregnancy: Secondary | ICD-10-CM | POA: Diagnosis not present

## 2021-08-19 DIAGNOSIS — O26893 Other specified pregnancy related conditions, third trimester: Secondary | ICD-10-CM | POA: Insufficient documentation

## 2021-08-19 LAB — URINALYSIS, ROUTINE W REFLEX MICROSCOPIC
Bilirubin Urine: NEGATIVE
Glucose, UA: NEGATIVE mg/dL
Hgb urine dipstick: NEGATIVE
Ketones, ur: NEGATIVE mg/dL
Nitrite: NEGATIVE
Protein, ur: NEGATIVE mg/dL
Specific Gravity, Urine: 1.012 (ref 1.005–1.030)
pH: 7 (ref 5.0–8.0)

## 2021-08-19 NOTE — Discharge Instructions (Signed)

## 2021-08-19 NOTE — MAU Provider Note (Signed)
?History  ?  ? ?CSN: [id number] ? ?Arrival date and time: 08/19/21 1912 ? ? Event Date/Time  ? First Provider Initiated Contact with Patient 08/19/21 2104   ?  ? ?Chief Complaint  ?Patient presents with  ? Pelvic Pain  ? ?HPI ?Valerie Newman is a 21 y.o. G2P1001 at [redacted]w[redacted]d who presents with pelvic pain. She states every time the baby moves she is leaking urine and it is causing pelvic pain. She reports the pelvic pain feels similar to the pain she feels when she has a UTI. She wanted to come make sure she didn't have a UTI. She denies any abdominal pain, vaginal bleeding or discharge. She reports normal fetal movement.  ? ?OB History   ? ? Gravida  ?2  ? Para  ?1  ? Term  ?1  ? Preterm  ?0  ? AB  ?0  ? Living  ?1  ?  ? ? SAB  ?0  ? IAB  ?0  ? Ectopic  ?0  ? Multiple  ?0  ? Live Births  ?1  ?   ?  ?  ? ? ?Past Medical History:  ?Diagnosis Date  ? 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 Allergy to pollen   ? Anemia   ? Anxiety   ? Asthma   ? ? ?Past Surgical History:  ?Procedure Laterality Date  ? NO PAST SURGERIES    ? TOOTH EXTRACTION    ? ? ?Family History  ?Problem Relation Age of Onset  ? Heart disease Mother   ? Stroke Mother   ? Hypertension Maternal Grandmother   ? Diabetes Maternal Grandmother   ? Diabetes Maternal Grandfather   ? Diabetes Maternal Uncle   ? Diabetes Maternal Aunt   ? ? ?Social History  ? ?Tobacco Use  ? Smoking status: Never  ?  Passive exposure: Never  ? Smokeless tobacco: Never  ?Vaping Use  ? Vaping Use: Never used  ?Substance Use Topics  ? Alcohol use: No  ? Drug use: No  ? ? ?Allergies:  ?Allergies  ?Allergen Reactions  ? Bee Venom Swelling  ? Metronidazole Nausea And Vomiting  ? Wasp Venom Swelling  ? ? ?Medications Prior to Admission  ?Medication Sig Dispense Refill Last Dose  ? Blood Pressure Monitoring (BLOOD PRESSURE MONITOR AUTOMAT) DEVI 1 Device by Does not apply route daily. Automatic blood pressure cuff regular size. To monitor blood pressure regularly at home. ICD-10 code:Z34.90 1  each 0   ? ? ?Review of Systems  ?Constitutional: Negative.  Negative for fatigue and fever.  ?HENT: Negative.    ?Respiratory: Negative.  Negative for shortness of breath.   ?Cardiovascular: Negative.  Negative for chest pain.  ?Gastrointestinal: Negative.  Negative for abdominal pain, constipation, diarrhea, nausea and vomiting.  ?Genitourinary:  Positive for pelvic pain. Negative for dysuria, vaginal bleeding and vaginal discharge.  ?Neurological: Negative.  Negative for dizziness and headaches.  ?Physical Exam  ? ?Blood pressure 114/67, pulse 80, temperature 98.1 ?F (36.7 ?C), temperature source Oral, resp. rate 20, height 5\' 3"  (1.6 m), weight 67.8 kg, last menstrual period 12/20/2020, SpO2 100 %. ? ?Physical Exam ?Vitals and nursing note reviewed.  ?Constitutional:   ?   General: She is not in acute distress. ?   Appearance: She is well-developed.  ?HENT:  ?   Head: Normocephalic.  ?Eyes:  ?   Pupils: Pupils are equal, round, and reactive to light.  ?Cardiovascular:  ?   Rate and  Rhythm: Normal rate and regular rhythm.  ?   Heart sounds: Normal heart sounds.  ?Pulmonary:  ?   Effort: Pulmonary effort is normal. No respiratory distress.  ?   Breath sounds: Normal breath sounds.  ?Abdominal:  ?   General: Bowel sounds are normal. There is no distension.  ?   Palpations: Abdomen is soft.  ?   Tenderness: There is no abdominal tenderness.  ?Skin: ?   General: Skin is warm and dry.  ?Neurological:  ?   Mental Status: She is alert and oriented to person, place, and time.  ?Psychiatric:     ?   Mood and Affect: Mood normal.     ?   Behavior: Behavior normal.     ?   Thought Content: Thought content normal.     ?   Judgment: Judgment normal.  ? ?Fetal Tracing: ? ?Baseline: 140 ?Variability: moderate ?Accels: 15x15 ?Decels: none ? ?Toco: occasional uc's ? ? ?MAU Course  ?Procedures ?Results for orders placed or performed during the hospital encounter of 08/19/21 (from the past 24 hour(s))  ?Urinalysis, Routine w reflex  microscopic Urine, Clean Catch     Status: Abnormal  ? Collection Time: 08/19/21  8:13 PM  ?Result Value Ref Range  ? Color, Urine YELLOW YELLOW  ? APPearance CLEAR CLEAR  ? Specific Gravity, Urine 1.012 1.005 - 1.030  ? pH 7.0 5.0 - 8.0  ? Glucose, UA NEGATIVE NEGATIVE mg/dL  ? Hgb urine dipstick NEGATIVE NEGATIVE  ? Bilirubin Urine NEGATIVE NEGATIVE  ? Ketones, ur NEGATIVE NEGATIVE mg/dL  ? Protein, ur NEGATIVE NEGATIVE mg/dL  ? Nitrite NEGATIVE NEGATIVE  ? Leukocytes,Ua TRACE (A) NEGATIVE  ? RBC / HPF 0-5 0 - 5 RBC/hpf  ? WBC, UA 6-10 0 - 5 WBC/hpf  ? Bacteria, UA RARE (A) NONE SEEN  ? Squamous Epithelial / LPF 0-5 0 - 5  ? Mucus PRESENT   ?  ?MDM ?UA, UC  ?NST reactive- occasional contractions, patient states she is not feeling them and declines cervical exam ? ?Patient states she is worried about the last ultrasound she had. She reports she had a follow up after her anatomy because they saw an EIF. She read the report of her follow up and read that something is still wrong and doesn't understand. She wants another ultrasound to be sure her baby is ok.  ? ?CNM reviewed previous ultrasound. No comment of EIF on repeat scan and a comment left stating "gall bladder with phyrigian cap vs dilated loop of bowel" Reassurance provided to patient but patient still concerned. Will order outpatient follow up ultrasound.  ? ?Assessment and Plan  ? ?1. Pelvic pain affecting pregnancy in third trimester, antepartum   ?2. [redacted] weeks gestation of pregnancy   ? ?-Discharge home in stable condition ?-Preterm labor precautions discussed ?-Patient advised to follow-up with OB as scheduled for prenatal care ?-Patient may return to MAU as needed or if her condition were to change or worsen ? ? ?Rolm Bookbinder CNM ?08/19/2021, 9:04 PM  ?

## 2021-08-19 NOTE — MAU Note (Signed)
PT SAYS ON Sunday- WHEN BABY MOVES - PT FEELS URINE COME OUT ?FEELS PRESSURE LIKE UTI- HAS HX OF UTI  ?[facility] WITH CLINIC ?DENIES HSV ?

## 2021-08-21 LAB — CULTURE, OB URINE: Culture: >=100000 — AB

## 2021-08-22 ENCOUNTER — Other Ambulatory Visit (INDEPENDENT_AMBULATORY_CARE_PROVIDER_SITE_OTHER): Payer: Medicaid Other | Admitting: Obstetrics and Gynecology

## 2021-08-22 DIAGNOSIS — O2343 Unspecified infection of urinary tract in pregnancy, third trimester: Secondary | ICD-10-CM

## 2021-08-22 MED ORDER — CEFADROXIL 500 MG PO CAPS: 500.0000 mg | ORAL_CAPSULE | Freq: Two times a day (BID) | ORAL | 0 refills | Status: AC

## 2021-08-22 NOTE — Progress Notes (Signed)
TC to patient to notify of (+) UCx results. She reports still feeling some increased bladder pressure and leaking of fluid with FM. Informed of Rx sent to pharmacy on file - pharmacy verified with patient. ? ?Raelyn Mora, CNM  ?08/22/2021 3:07 PM  ?

## 2021-08-28 ENCOUNTER — Ambulatory Visit (INDEPENDENT_AMBULATORY_CARE_PROVIDER_SITE_OTHER): Payer: Medicaid Other | Admitting: Obstetrics & Gynecology

## 2021-08-28 ENCOUNTER — Other Ambulatory Visit (HOSPITAL_COMMUNITY)
Admission: RE | Admit: 2021-08-28 | Discharge: 2021-08-28 | Disposition: A | Discharge status: Home / Self Care | Payer: Medicaid Other | Source: Ambulatory Visit | Attending: Obstetrics & Gynecology | Admitting: Obstetrics & Gynecology

## 2021-08-28 ENCOUNTER — Encounter: Payer: Self-pay | Admitting: Obstetrics & Gynecology

## 2021-08-28 ENCOUNTER — Other Ambulatory Visit: Payer: Self-pay

## 2021-08-28 VITALS — BP 106/65 | HR 87 | Wt 151.0 lb

## 2021-08-28 DIAGNOSIS — Z3483 Encounter for supervision of other normal pregnancy, third trimester: Secondary | ICD-10-CM | POA: Diagnosis present

## 2021-08-28 DIAGNOSIS — Z3A36 36 weeks gestation of pregnancy: Secondary | ICD-10-CM | POA: Insufficient documentation

## 2021-08-28 DIAGNOSIS — O2243 Hemorrhoids in pregnancy, third trimester: Secondary | ICD-10-CM | POA: Insufficient documentation

## 2021-08-28 MED ORDER — HYDROCORTISONE ACETATE 25 MG RE SUPP: 25.0000 mg | Freq: Two times a day (BID) | RECTAL | 1 refills | Status: DC

## 2021-08-28 NOTE — Progress Notes (Signed)
? ?  PRENATAL VISIT NOTE ? ?Subjective:  ?Valerie Newman is a 21 y.o. G2P1001 at [redacted]w[redacted]d being seen today for ongoing prenatal care.  She is currently monitored for the following issues for this low-risk pregnancy and has Supervision of other normal pregnancy, antepartum; Anxiety in pregnancy in second trimester, antepartum; Infusion reaction; and Hemorrhoids during pregnancy in third trimester on their problem list. ? ?Patient reports  painful hemorrhoids. No bleeding noted. Not helped by OTC  cream remedy .  Contractions: Irritability. Vag. Bleeding: None.  Movement: Present. Denies leaking of fluid.  ? ?The following portions of the patient's history were reviewed and updated as appropriate: allergies, current medications, past family history, past medical history, past social history, past surgical history and problem list.  ? ?Objective:  ? ?Vitals:  ? 08/28/21 1038  ?BP: 106/65  ?Pulse: 87  ?Weight: 151 lb (68.5 kg)  ? ? ?Fetal Status: Fetal Heart Rate (bpm): 153 Fundal Height: 36 cm Movement: Present  Presentation: Vertex ? ?General:  Alert, oriented and cooperative. Patient is in no acute distress.  ?Skin: Skin is warm and dry. No rash noted.   ?Cardiovascular: Normal heart rate noted  ?Respiratory: Normal respiratory effort, no problems with respiration noted  ?Abdomen: Soft, gravid, appropriate for gestational age.  Pain/Pressure: Present     ?Pelvic: Cervical exam performed in the presence of a chaperone Dilation: Closed Effacement (%): 30 Station: Ballotable. No external hemorrhoids seen. Pelvic cultures obtained.   ?Extremities: Normal range of motion.  Edema: None  ?Mental Status: Normal mood and affect. Normal behavior. Normal judgment and thought content.  ? ?Assessment and Plan:  ?Pregnancy: G2P1001 at [redacted]w[redacted]d ?1. Hemorrhoids during pregnancy in third trimester ?Likely internal hemorrhoids, patient feels she notices them when she is on toilet.  Anusol suppositories prescribed, will monitor effect. ?-  hydrocortisone (ANUSOL-HC) 25 MG suppository; Place 1 suppository (25 mg total) rectally 2 (two) times daily.  Dispense: 12 suppository; Refill: 1 ? ?2. [redacted] weeks gestation of pregnancy ?3. Supervision of other normal pregnancy, antepartum ?Pelvic cultures done today, will follow up results and manage accordingly. ?- Culture, beta strep (group b only) ?- Cervicovaginal ancillary only( [place]) ?Preterm labor symptoms and general obstetric precautions including but not limited to vaginal bleeding, contractions, leaking of fluid and fetal movement were reviewed in detail with the patient. ?Please refer to After Visit Summary for other counseling recommendations.  ? ?Return in about 1 week (around 09/04/2021) for OFFICE OB VISIT (MD or APP). ? ?Future Appointments  ?Date Time Provider Department Center  ?09/04/2021  3:15 PM Reva Bores, MD [facility]  ? ? ?Jaynie Collins, MD ? ?

## 2021-08-28 NOTE — Patient Instructions (Signed)
Return to office for any scheduled appointments. Call the office or go to the MAU at [facility] [place] if:  You begin to have strong, frequent contractions  Your water breaks.  Sometimes it is a big gush of fluid, sometimes it is just a trickle that keeps getting your panties wet or running down your legs  You have vaginal bleeding.  It is normal to have a small amount of spotting if your cervix was checked.   You do not feel your baby moving like normal.  If you do not, get something to eat and drink and lay down and focus on feeling your baby move.   If your baby is still not moving like normal, you should call the office or go to MAU.  Any other obstetric concerns.   

## 2021-08-28 NOTE — Progress Notes (Signed)
Patient reports contractions. She stated that she did not time how far apart each contraction was or how long each contraction lasted. ? ?She also informed me that she has not started her Duricef Rx but plans to start today  ?

## 2021-08-29 LAB — CERVICOVAGINAL ANCILLARY ONLY
Chlamydia: NEGATIVE
Comment: NEGATIVE
Comment: NORMAL
Neisseria Gonorrhea: NEGATIVE

## 2021-08-30 ENCOUNTER — Encounter (HOSPITAL_COMMUNITY): Payer: Self-pay | Admitting: Emergency Medicine

## 2021-08-30 ENCOUNTER — Other Ambulatory Visit: Payer: Self-pay

## 2021-08-30 ENCOUNTER — Ambulatory Visit (HOSPITAL_COMMUNITY)
Admission: EM | Admit: 2021-08-30 | Discharge: 2021-08-30 | Disposition: A | Discharge status: Home / Self Care | Payer: Medicaid Other | Attending: Urgent Care | Admitting: Urgent Care

## 2021-08-30 DIAGNOSIS — Z3493 Encounter for supervision of normal pregnancy, unspecified, third trimester: Secondary | ICD-10-CM

## 2021-08-30 DIAGNOSIS — Z3A00 Weeks of gestation of pregnancy not specified: Secondary | ICD-10-CM | POA: Diagnosis not present

## 2021-08-30 DIAGNOSIS — O2243 Hemorrhoids in pregnancy, third trimester: Secondary | ICD-10-CM | POA: Diagnosis not present

## 2021-08-30 DIAGNOSIS — K644 Residual hemorrhoidal skin tags: Secondary | ICD-10-CM | POA: Diagnosis not present

## 2021-08-30 MED ORDER — PROCTOFOAM HC 1-1 % EX FOAM: 1.0000 | Freq: Two times a day (BID) | CUTANEOUS | 0 refills | Status: DC

## 2021-08-30 NOTE — ED Provider Notes (Signed)
?[facility] ? ? ? ?CSN: [id number] ?Arrival date & time: 08/30/21  1001 ? ? ?  ? ?History   ?Chief Complaint ?Chief Complaint  ?Patient presents with  ? Hemorrhoids  ? ? ?HPI ?Valerie Newman is a 21 y.o. female.  ? ?Pleasant 21yo female who is pregnant and due on April 14th, presents today with concerns of a painful, bleeding hemorrhoid. She states she had one hemorrhoid last week that went away on its own, but states this one seems to be getting larger and painful. She called her OB who prescribed her a rectal suppository two days ago, but pt states it was not covered by her insurance thus did not get it filled. She has tried OTC creams and hot baths without relief. She does not eat any fruits or vegetables, but denies straining or constipation. ? ? ? ?Past Medical History:  ?Diagnosis Date  ? 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 Allergy to pollen   ? Anemia   ? Anxiety   ? Asthma   ? ? ?Patient Active Problem List  ? Diagnosis Date Noted  ? Hemorrhoids during pregnancy in third trimester 08/28/2021  ? Infusion reaction 07/15/2021  ? Anxiety in pregnancy in second trimester, antepartum 05/06/2021  ? Supervision of other normal pregnancy, antepartum 03/11/2021  ? ? ?Past Surgical History:  ?Procedure Laterality Date  ? NO PAST SURGERIES    ? TOOTH EXTRACTION    ? ? ?OB History   ? ? Gravida  ?2  ? Para  ?1  ? Term  ?1  ? Preterm  ?0  ? AB  ?0  ? Living  ?1  ?  ? ? SAB  ?0  ? IAB  ?0  ? Ectopic  ?0  ? Multiple  ?0  ? Live Births  ?1  ?   ?  ?  ? ? ? ?Home Medications   ? ?Prior to Admission medications   ?Medication Sig Start Date End Date Taking? Authorizing Provider  ?cefadroxil (DURICEF) 500 MG capsule Take 1 capsule (500 mg total) by mouth 2 (two) times daily for 10 days. 08/22/21 09/01/21 Yes Laury Deep, CNM  ?hydrocortisone-pramoxine (PROCTOFOAM HC) rectal foam Place 1 applicator rectally 2 (two) times daily. 08/30/21  Yes Myrikal Messmer L, PA  ?Blood Pressure Monitoring (BLOOD PRESSURE MONITOR  AUTOMAT) DEVI 1 Device by Does not apply route daily. Automatic blood pressure cuff regular size. To monitor blood pressure regularly at home. ICD-10 code:Z34.90 ?Patient not taking: Reported on 08/28/2021 03/11/21   Laury Deep, CNM  ?hydrocortisone (ANUSOL-HC) 25 MG suppository Place 1 suppository (25 mg total) rectally 2 (two) times daily. 08/28/21   Osborne Oman, MD  ? ? ?Family History ?Family History  ?Problem Relation Age of Onset  ? Heart disease Mother   ? Stroke Mother   ? Hypertension Maternal Grandmother   ? Diabetes Maternal Grandmother   ? Diabetes Maternal Grandfather   ? Diabetes Maternal Uncle   ? Diabetes Maternal Aunt   ? ? ?Social History ?Social History  ? ?Tobacco Use  ? Smoking status: Never  ?  Passive exposure: Never  ? Smokeless tobacco: Never  ?Vaping Use  ? Vaping Use: Never used  ?Substance Use Topics  ? Alcohol use: No  ? Drug use: No  ? ? ? ?Allergies   ?Bee venom, Metronidazole, and Wasp venom ? ? ?Review of Systems ?Review of Systems  ?Gastrointestinal:   ?     Painful hemorrhoid  ?  All other systems reviewed and are negative. ? ? ?Physical Exam ?Triage Vital Signs ?ED Triage Vitals  ?Enc Vitals Group  ?   BP 08/30/21 1017 120/79  ?   Pulse Rate 08/30/21 1017 93  ?   Resp 08/30/21 1017 16  ?   Temp 08/30/21 1017 97.7 ?F (36.5 ?C)  ?   Temp Source 08/30/21 1017 Oral  ?   SpO2 08/30/21 1017 98 %  ?   Weight --   ?   Height --   ?   Head Circumference --   ?   Peak Flow --   ?   Pain Score 08/30/21 1015 8  ?   Pain Loc --   ?   Pain Edu? --   ?   Excl. in [place]? --   ? ?No data found. ? ?Updated Vital Signs ?BP 120/79 (BP Location: Right Arm)   Pulse 93   Temp 97.7 ?F (36.5 ?C) (Oral)   Resp 16   LMP 12/20/2020   SpO2 98%  ? ?Visual Acuity ?Right Eye Distance:   ?Left Eye Distance:   ?Bilateral Distance:   ? ?Right Eye Near:   ?Left Eye Near:    ?Bilateral Near:    ? ?Physical Exam ?Vitals and nursing note reviewed.  ?Constitutional:   ?   General: She is not in acute distress. ?    Appearance: Normal appearance. She is normal weight. She is not ill-appearing, toxic-appearing or diaphoretic.  ?HENT:  ?   Head: Normocephalic and atraumatic.  ?Cardiovascular:  ?   Rate and Rhythm: Normal rate and regular rhythm.  ?   Pulses: Normal pulses.  ?Pulmonary:  ?   Effort: Pulmonary effort is normal.  ?   Breath sounds: Normal breath sounds. No wheezing.  ?Chest:  ?   Chest wall: No tenderness.  ?Genitourinary: ?   Comments: Very large, pedunculated, single non-thrombosed, not actively bleeding hemorrhoid ?Musculoskeletal:  ?   Right lower leg: No edema.  ?   Left lower leg: No edema.  ?Skin: ?   General: Skin is warm.  ?   Capillary Refill: Capillary refill takes less than 2 seconds.  ?   Coloration: Skin is not jaundiced.  ?   Findings: No erythema.  ?Neurological:  ?   General: No focal deficit present.  ?   Mental Status: She is alert and oriented to person, place, and time.  ?Psychiatric:     ?   Mood and Affect: Mood normal.     ?   Behavior: Behavior normal.  ? ? ? ?UC Treatments / Results  ?Labs ?(all labs ordered are listed, but only abnormal results are displayed) ?Labs Reviewed - No data to display ? ?EKG ? ? ?Radiology ?No results found. ? ?Procedures ?Procedures (including critical care time) ? ?Medications Ordered in UC ?Medications - No data to display ? ?Initial Impression / Assessment and Plan / UC Course  ?I have reviewed the triage vital signs and the nursing notes. ? ?Pertinent labs & imaging results that were available during my care of the patient were reviewed by me and considered in my medical decision making (see chart for details). ? ?  ? ?Inflamed external hemorrhoid - discussed with pt that this was a common finding with pregnancy. Supportive care recommended first, including laying on LEFT side, increasing fiber from fruits and vegetables, maintaining hydration, and avoiding straining. Can try topical proctofoam if worsening. GoodRx coupon was printed for patient as well to  fill without using her insurance the suppository is only $11 at [employer]. F/U with OB ? ?Final Clinical Impressions(s) / UC Diagnoses  ? ?Final diagnoses:  ?Inflamed external hemorrhoid  ?Third trimester pregnancy  ? ? ? ?Discharge Instructions   ? ?  ?Your hemorrhoid is likely a complication from pregnancy. These are very common due to the weight of the baby and the pressure placed on your pelvis. ?Delivery of the baby usually resolves the hemorrhoids. ?Please lay on your LEFT side, not your right. This may help. ?You MUST increase your fruits and vegetables - eating more of these natural sources of fiber will also help prevent hemorrhoids. ?Drink plenty of water. Soak in warm baths, call your OB to see if a Sitz bath solution is safe to use. ?I have sent in proctofoam [facility] to try. Please note these Pregnancy Considerations: ?Adverse events have been observed with corticosteroids in animal reproduction studies. Rectal use of pramoxine and hydrocortisone for the treatment of hemorrhoids in the third trimester of pregnancy was not shown to affect birth weight. ? ?Call your OB for further recommendations. You could consider filling the suppository called in for you by your OB and using the $11.38 GoodRx coupon at [facility] should symptoms persist. ? ? ? ? ?ED Prescriptions   ? ? Medication Sig Dispense Auth. Provider  ? hydrocortisone-pramoxine (PROCTOFOAM HC) rectal foam Place 1 applicator rectally 2 (two) times daily. 10 g Geryl Councilman L, PA  ? ?  ? ?PDMP not reviewed this encounter. ?  Chaney Malling, [place] ?08/30/21 1645 ? ?

## 2021-08-30 NOTE — ED Triage Notes (Signed)
Patient c/o hemorrhoids x 1 week.  ? ?Patient endorses history of hemorrhoids.  ? ?Patient endorses anal pressure and " blood when I wipe".  ? ?Patient endorses being 9 months pregnant.  ? ?Patient has tried warm baths and OTC "hemorrhoid cream" with no relief of symptoms.  ? ? ?

## 2021-08-30 NOTE — Discharge Instructions (Addendum)
Your hemorrhoid is likely a complication from pregnancy. These are very common due to the weight of the baby and the pressure placed on your pelvis. ?Delivery of the baby usually resolves the hemorrhoids. ?Please lay on your LEFT side, not your right. This may help. ?You MUST increase your fruits and vegetables - eating more of these natural sources of fiber will also help prevent hemorrhoids. ?Drink plenty of water. Soak in warm baths, call your OB to see if a Sitz bath solution is safe to use. ?I have sent in proctofoam [facility] to try. Please note these Pregnancy Considerations: ?Adverse events have been observed with corticosteroids in animal reproduction studies. Rectal use of pramoxine and hydrocortisone for the treatment of hemorrhoids in the third trimester of pregnancy was not shown to affect birth weight. ? ?Call your OB for further recommendations. You could consider filling the suppository called in for you by your OB and using the $11.38 GoodRx coupon at [facility] should symptoms persist. ?

## 2021-08-31 ENCOUNTER — Inpatient Hospital Stay (HOSPITAL_COMMUNITY)
Admission: AD | Admit: 2021-08-31 | Discharge: 2021-09-01 | Disposition: A | Discharge status: Home / Self Care | Payer: Medicaid Other | Attending: Obstetrics & Gynecology | Admitting: Obstetrics & Gynecology

## 2021-08-31 DIAGNOSIS — R109 Unspecified abdominal pain: Secondary | ICD-10-CM | POA: Insufficient documentation

## 2021-08-31 DIAGNOSIS — Z3A36 36 weeks gestation of pregnancy: Secondary | ICD-10-CM | POA: Insufficient documentation

## 2021-08-31 DIAGNOSIS — R102 Pelvic and perineal pain: Secondary | ICD-10-CM | POA: Insufficient documentation

## 2021-08-31 DIAGNOSIS — O26893 Other specified pregnancy related conditions, third trimester: Secondary | ICD-10-CM | POA: Insufficient documentation

## 2021-09-01 ENCOUNTER — Other Ambulatory Visit: Payer: Self-pay

## 2021-09-01 DIAGNOSIS — Z3A36 36 weeks gestation of pregnancy: Secondary | ICD-10-CM

## 2021-09-01 DIAGNOSIS — R102 Pelvic and perineal pain: Secondary | ICD-10-CM | POA: Diagnosis not present

## 2021-09-01 DIAGNOSIS — O26893 Other specified pregnancy related conditions, third trimester: Secondary | ICD-10-CM | POA: Diagnosis present

## 2021-09-01 DIAGNOSIS — R109 Unspecified abdominal pain: Secondary | ICD-10-CM | POA: Diagnosis not present

## 2021-09-01 LAB — CULTURE, BETA STREP (GROUP B ONLY): Strep Gp B Culture: NEGATIVE

## 2021-09-01 NOTE — MAU Note (Addendum)
.  Valerie Newman is a 21 y.o. at [redacted]w[redacted]d here in MAU via EMS reporting ctx since 2300.  Pt states that before the ctx she was bouncing on her birthing ball and moving furniture. Reports pink/orange discharge for the past couple days and increased vaginal pressure. Denies LOF. +FM.    ? ?Pain score: 7/10 ? ?

## 2021-09-01 NOTE — MAU Provider Note (Signed)
Patient Valerie Newman is a 21 y.o. G2P1001 ? At [redacted]w[redacted]d here with complaints of pelvic pain and contractions that started around 2330. Patient denies VB (endorses some rectal bleeding from hemorroids), denies LOF, decreased fetal movements, other OB complaints. She arrived by EMS. Sister is at the bedside giving  history as well. She denies any complications in this pregnancy.  ?History  ?  ? ?CSN: [id number] ? ?Arrival date and time: 08/31/21 2357 ? ? None  ?  ? ?Chief Complaint  ?Patient presents with  ? Contractions  ? ?Abdominal Pain ?This is a new problem. The current episode started today. The onset quality is sudden. The problem occurs constantly. The pain is located in the suprapubic region. The pain is at a severity of 0/10. The quality of the pain is described as sharp. The pain does not radiate. Pertinent negatives include no anorexia, diarrhea, dysuria, fever, flatus, nausea or vomiting.  ?Patient states that she has been "bouncing on a birthing ball" at home, then moving furniture this evening. She states she has been very active and then around 2330 starting having contractions 10/10 and so she called EMS.  ?OB History   ? ? Gravida  ?2  ? Para  ?1  ? Term  ?1  ? Preterm  ?0  ? AB  ?0  ? Living  ?1  ?  ? ? SAB  ?0  ? IAB  ?0  ? Ectopic  ?0  ? Multiple  ?0  ? Live Births  ?1  ?   ?  ?  ? ? ?Past Medical History:  ?Diagnosis Date  ? 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 Allergy to pollen   ? Anemia   ? Anxiety   ? Asthma   ? ? ?Past Surgical History:  ?Procedure Laterality Date  ? NO PAST SURGERIES    ? TOOTH EXTRACTION    ? ? ?Family History  ?Problem Relation Age of Onset  ? Heart disease Mother   ? Stroke Mother   ? Hypertension Maternal Grandmother   ? Diabetes Maternal Grandmother   ? Diabetes Maternal Grandfather   ? Diabetes Maternal Uncle   ? Diabetes Maternal Aunt   ? ? ?Social History  ? ?Tobacco Use  ? Smoking status: Never  ?  Passive exposure: Never  ? Smokeless tobacco: Never  ?Vaping Use  ?  Vaping Use: Never used  ?Substance Use Topics  ? Alcohol use: No  ? Drug use: No  ? ? ?Allergies:  ?Allergies  ?Allergen Reactions  ? Bee Venom Swelling  ? Metronidazole Nausea And Vomiting  ? Wasp Venom Swelling  ? ? ?No medications prior to admission.  ? ? ?Review of Systems  ?Constitutional: Negative.  Negative for fever.  ?HENT: Negative.    ?Respiratory: Negative.    ?Cardiovascular: Negative.   ?Gastrointestinal:  Positive for abdominal pain. Negative for anorexia, diarrhea, flatus, nausea and vomiting.  ?Genitourinary: Negative.  Negative for dysuria.  ?Musculoskeletal: Negative.   ?Neurological: Negative.   ?Hematological: Negative.   ?Psychiatric/Behavioral: Negative.    ?Physical Exam  ? ?Blood pressure 106/65, pulse 75, temperature 98 ?F (36.7 ?C), temperature source Oral, resp. rate 20, last menstrual period 12/20/2020, SpO2 99 %. ? ?Physical Exam ?Constitutional:   ?   Appearance: Normal appearance.  ?Cardiovascular:  ?   Rate and Rhythm: Normal rate.  ?Pulmonary:  ?   Effort: Pulmonary effort is normal.  ?Abdominal:  ?   General: Abdomen is flat.  ?  Musculoskeletal:  ?   Cervical back: Normal range of motion.  ?Skin: ?   General: Skin is warm.  ?Neurological:  ?   General: No focal deficit present.  ?   Mental Status: She is alert.  ?Psychiatric:     ?   Mood and Affect: Mood normal.  ? ? ?MAU Course  ?Procedures ? ?MDM ?-patient declines vaginal exam, she declines any other treatment. She reports strong fetal movements and denies contractions. Reports that her pain is 0/10.  ?-cervix was 0.5 while in MAU ?-NST ?Assessment and Plan  ? ?1. Pelvic pain during pregnancy in third trimester, antepartum   ?2. [redacted] weeks gestation of pregnancy   ?-patient stable for discharge; she declines any further work-up, stating "I'm fine, I want to go home and eat my tacos".  ?-keep follow up appt on 3/23 with Dr. Shawnie Pons ?-reviewed warning signs and when to return to MAU; reviewed its importance to be active but that she  also needs to rest and hydrate, be careful about moving heavy furniture and remember that bouncing on birthing ball not recommended for extended periods of time ?-All questions answered ? ? ?Marylene Land ?09/01/2021, 1:22 AM  ?

## 2021-09-04 ENCOUNTER — Other Ambulatory Visit: Payer: Self-pay

## 2021-09-04 ENCOUNTER — Ambulatory Visit (INDEPENDENT_AMBULATORY_CARE_PROVIDER_SITE_OTHER): Payer: Medicaid Other | Admitting: Family Medicine

## 2021-09-04 VITALS — BP 111/63 | HR 82 | Wt 153.7 lb

## 2021-09-04 DIAGNOSIS — O99342 Other mental disorders complicating pregnancy, second trimester: Secondary | ICD-10-CM

## 2021-09-04 DIAGNOSIS — F419 Anxiety disorder, unspecified: Secondary | ICD-10-CM

## 2021-09-04 DIAGNOSIS — Z3480 Encounter for supervision of other normal pregnancy, unspecified trimester: Secondary | ICD-10-CM

## 2021-09-04 NOTE — Progress Notes (Signed)
? ?  PRENATAL VISIT NOTE ? ?Subjective:  ?Valerie Newman is a 21 y.o. G2P1001 at [redacted]w[redacted]d being seen today for ongoing prenatal care.  She is currently monitored for the following issues for this low-risk pregnancy and has Supervision of other normal pregnancy, antepartum; Anxiety in pregnancy in second trimester, antepartum; Infusion reaction; and Hemorrhoids during pregnancy in third trimester on their problem list. ? ?Patient reports no complaints.  Contractions: Irritability. Vag. Bleeding: None.  Movement: Present. Denies leaking of fluid.  ? ?The following portions of the patient's history were reviewed and updated as appropriate: allergies, current medications, past family history, past medical history, past social history, past surgical history and problem list.  ? ?Objective:  ? ?Vitals:  ? 09/04/21 1518  ?BP: 111/63  ?Pulse: 82  ?Weight: 153 lb 11.2 oz (69.7 kg)  ? ? ?Fetal Status: Fetal Heart Rate (bpm): 129 Fundal Height: 34 cm Movement: Present  Presentation: Vertex ? ?General:  Alert, oriented and cooperative. Patient is in no acute distress.  ?Skin: Skin is warm and dry. No rash noted.   ?Cardiovascular: Normal heart rate noted  ?Respiratory: Normal respiratory effort, no problems with respiration noted  ?Abdomen: Soft, gravid, appropriate for gestational age.  Pain/Pressure: Present     ?Pelvic: Cervical exam performed in the presence of a chaperone Dilation: 1.5 Effacement (%): 30 Station: Ballotable  ?Extremities: Normal range of motion.  Edema: None  ?Mental Status: Normal mood and affect. Normal behavior. Normal judgment and thought content.  ? ?Assessment and Plan:  ?Pregnancy: G2P1001 at [redacted]w[redacted]d ?1. Supervision of other normal pregnancy, antepartum ?Continue routine prenatal care. ? ?Preterm labor symptoms and general obstetric precautions including but not limited to vaginal bleeding, contractions, leaking of fluid and fetal movement were reviewed in detail with the patient. ?Please refer to After  Visit Summary for other counseling recommendations.  ? ?Return in 1 week (on 09/11/2021) for [facility]. ? ?Future Appointments  ?Date Time Provider [place]  ?09/11/2021  3:15 PM Donnamae Jude, MD [facility]  ?09/17/2021  8:55 AM Radene Gunning, MD [facility]  ?09/24/2021  2:35 PM Radene Gunning, MD [facility]  ? ? ?Donnamae Jude, MD ? ?

## 2021-09-04 NOTE — Patient Instructions (Signed)

## 2021-09-05 ENCOUNTER — Encounter: Payer: Medicaid Other | Admitting: Certified Nurse Midwife

## 2021-09-10 ENCOUNTER — Encounter: Payer: Medicaid Other | Admitting: Certified Nurse Midwife

## 2021-09-11 ENCOUNTER — Ambulatory Visit (INDEPENDENT_AMBULATORY_CARE_PROVIDER_SITE_OTHER): Payer: Medicaid Other | Admitting: Family Medicine

## 2021-09-11 VITALS — BP 111/68 | HR 85 | Wt 157.0 lb

## 2021-09-11 DIAGNOSIS — Z3480 Encounter for supervision of other normal pregnancy, unspecified trimester: Secondary | ICD-10-CM

## 2021-09-11 DIAGNOSIS — N898 Other specified noninflammatory disorders of vagina: Secondary | ICD-10-CM

## 2021-09-11 DIAGNOSIS — O99342 Other mental disorders complicating pregnancy, second trimester: Secondary | ICD-10-CM

## 2021-09-11 DIAGNOSIS — F419 Anxiety disorder, unspecified: Secondary | ICD-10-CM

## 2021-09-11 MED ORDER — TERCONAZOLE 0.8 % VA CREA: 1.0000 | TOPICAL_CREAM | Freq: Every day | VAGINAL | 0 refills | Status: DC

## 2021-09-11 NOTE — Patient Instructions (Signed)

## 2021-09-12 ENCOUNTER — Encounter (HOSPITAL_COMMUNITY): Payer: Self-pay | Admitting: *Deleted

## 2021-09-12 ENCOUNTER — Telehealth (HOSPITAL_COMMUNITY): Payer: Self-pay | Admitting: *Deleted

## 2021-09-12 NOTE — Progress Notes (Signed)
? ?  PRENATAL VISIT NOTE ? ?Subjective:  ?Valerie Newman is a 21 y.o. G2P1001 at [redacted]w[redacted]d being seen today for ongoing prenatal care.  She is currently monitored for the following issues for this low-risk pregnancy and has Supervision of other normal pregnancy, antepartum; Anxiety in pregnancy in second trimester, antepartum; Infusion reaction; and Hemorrhoids during pregnancy in third trimester on their problem list. ? ?Patient reports vaginal irritation and following her waxing on one side. Very itchy .  Contractions: Irritability. Vag. Bleeding: None.  Movement: Present. Denies leaking of fluid.  ? ?The following portions of the patient's history were reviewed and updated as appropriate: allergies, current medications, past family history, past medical history, past social history, past surgical history and problem list.  ? ?Objective:  ? ?Vitals:  ? 09/11/21 1513  ?BP: 111/68  ?Pulse: 85  ?Weight: 157 lb (71.2 kg)  ? ? ?Fetal Status: Fetal Heart Rate (bpm): 158 Fundal Height: 34 cm Movement: Present  Presentation: Vertex ? ?General:  Alert, oriented and cooperative. Patient is in no acute distress.  ?Skin: Skin is warm and dry. No rash noted.   ?Cardiovascular: Normal heart rate noted  ?Respiratory: Normal respiratory effort, no problems with respiration noted  ?Abdomen: Soft, gravid, appropriate for gestational age.  Pain/Pressure: Absent     ?Pelvic: Cervical exam performed in the presence of a chaperone Dilation: 2 Effacement (%): 60  vulvar irritation on left c/w yeast, satellite lesions, white plaque  ?Extremities: Normal range of motion.  Edema: None  ?Mental Status: Normal mood and affect. Normal behavior. Normal judgment and thought content.  ? ?Assessment and Plan:  ?Pregnancy: G2P1001 at [redacted]w[redacted]d ?1. Supervision of other normal pregnancy, antepartum ?Continue routine prenatal care. ?GBS negative ?Desires IOL at 40 wks-orders placed ? ?2. Anxiety in pregnancy in second trimester, antepartum ? ?3. Vaginal  irritation ?C/w yeast--will treat ?- terconazole (TERAZOL 3) 0.8 % vaginal cream; Place 1 applicator vaginally at bedtime.  Dispense: 20 g; Refill: 0 ? ?Preterm labor symptoms and general obstetric precautions including but not limited to vaginal bleeding, contractions, leaking of fluid and fetal movement were reviewed in detail with the patient. ?Please refer to After Visit Summary for other counseling recommendations.  ? ?Return in 1 week (on 09/18/2021). ? ?Future Appointments  ?Date Time Provider Department Center  ?09/17/2021  8:55 AM Milas Hock, MD [facility]  ?09/24/2021  2:35 PM Milas Hock, MD [facility]  ?09/26/2021  7:00 AM [facility] SCHED ROOM [facility] None  ? ? ?Reva Bores, MD ? ?

## 2021-09-12 NOTE — Telephone Encounter (Signed)
Preadmission screen  

## 2021-09-14 NOTE — Progress Notes (Signed)
? ?  PRENATAL VISIT NOTE ? ?Subjective:  ?Valerie Newman is a 21 y.o. G2P1001 at [redacted]w[redacted]d being seen today for ongoing prenatal care.  She is currently monitored for the following issues for this low-risk pregnancy and has Supervision of other normal pregnancy, antepartum; Anxiety in pregnancy in second trimester, antepartum; Infusion reaction; and Hemorrhoids during pregnancy in third trimester on their problem list. ? ?Patient reports no complaints.  Contractions: Irritability. Vag. Bleeding: None.  Movement: Present. Denies leaking of fluid.  ? ?The following portions of the patient's history were reviewed and updated as appropriate: allergies, current medications, past family history, past medical history, past social history, past surgical history and problem list.  ? ?Objective:  ? ?Vitals:  ? 09/17/21 0901  ?BP: 102/62  ?Pulse: 62  ?Weight: 154 lb 11.2 oz (70.2 kg)  ? ? ?Fetal Status: Fetal Heart Rate (bpm): 143 Fundal Height: 37 cm Movement: Present    ? ?General:  Alert, oriented and cooperative. Patient is in no acute distress.  ?Skin: Skin is warm and dry. No rash noted.   ?Cardiovascular: Normal heart rate noted  ?Respiratory: Normal respiratory effort, no problems with respiration noted  ?Abdomen: Soft, gravid, appropriate for gestational age.  Pain/Pressure: Present     ?Pelvic: Cervical exam deferred Dilation: 3 Effacement (%): 60 Station: -2  ?Extremities: Normal range of motion.  Edema: None  ?Mental Status: Normal mood and affect. Normal behavior. Normal judgment and thought content.  ? ?Assessment and Plan:  ?Pregnancy: G2P1001 at [redacted]w[redacted]d ?1. Supervision of other normal pregnancy, antepartum ?- IOL scheduled for 4/14 ? ?Preterm labor symptoms and general obstetric precautions including but not limited to vaginal bleeding, contractions, leaking of fluid and fetal movement were reviewed in detail with the patient. ?Please refer to After Visit Summary for other counseling recommendations.  ? ?Return in  about 1 week (around 09/24/2021) for OB VISIT, MD or APP. ? ?Future Appointments  ?Date Time Provider Department Center  ?09/24/2021  2:35 PM Milas Hock, MD [facility]  ?09/26/2021  7:00 AM [facility] SCHED ROOM [facility] None  ? ? ?Milas Hock, MD ?

## 2021-09-16 IMAGING — US US OB < 14 WEEKS - US OB TV
1 series · 15 of 28 positions shown · non-contrast
Comparison: None.

CLINICAL DATA: Pelvic pain common known positive pregnancy test

EXAM:
OBSTETRIC <14 WK US AND TRANSVAGINAL OB US
TECHNIQUE: Both transabdominal and transvaginal ultrasound examinations were
performed for complete evaluation of the gestation as well as the
maternal uterus, adnexal regions, and pelvic cul-de-sac.
Transvaginal technique was performed to assess early pregnancy.

[Series 1: us ob < 14 weeks - us ob tv · 15 of 45 slices shown]
[im 1/45]
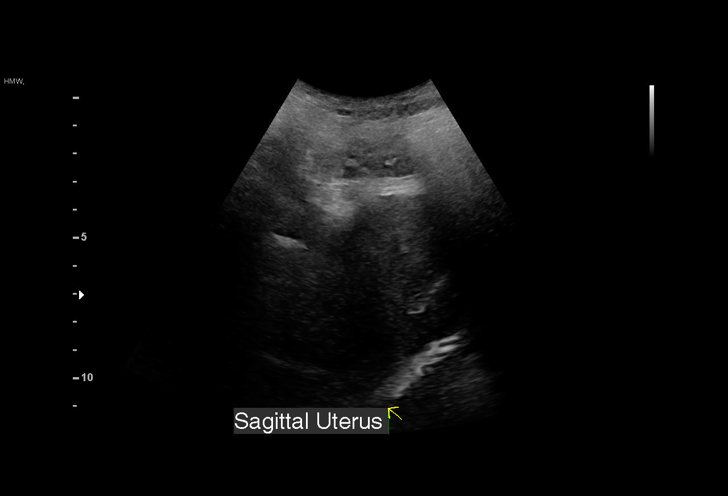
[im 4/45]
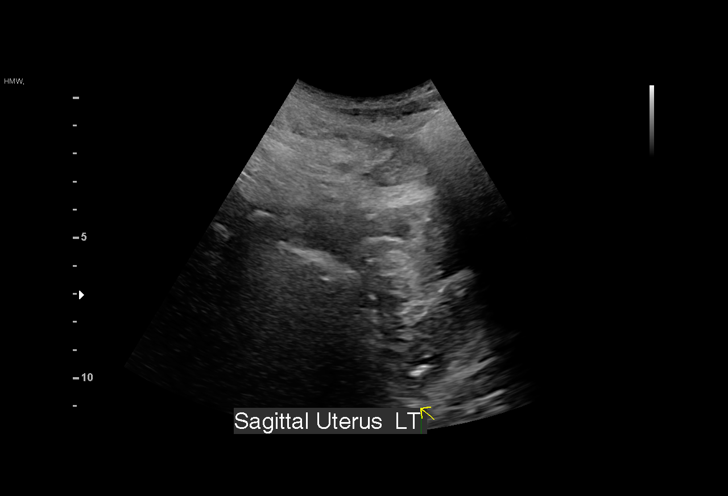
[im 7/45]
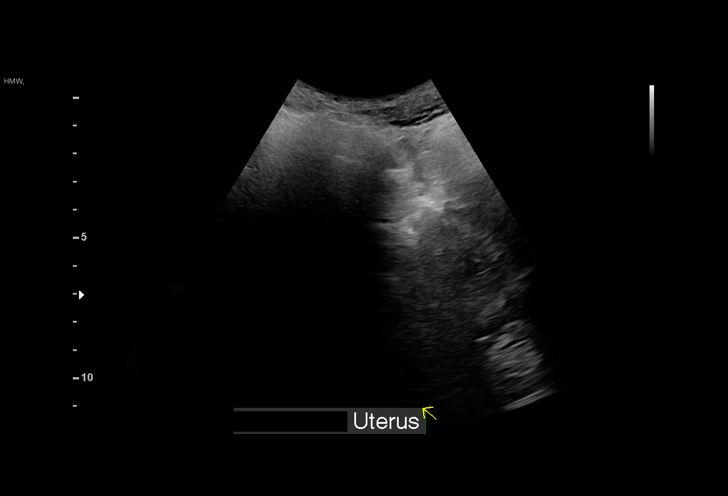
[im 10/45]
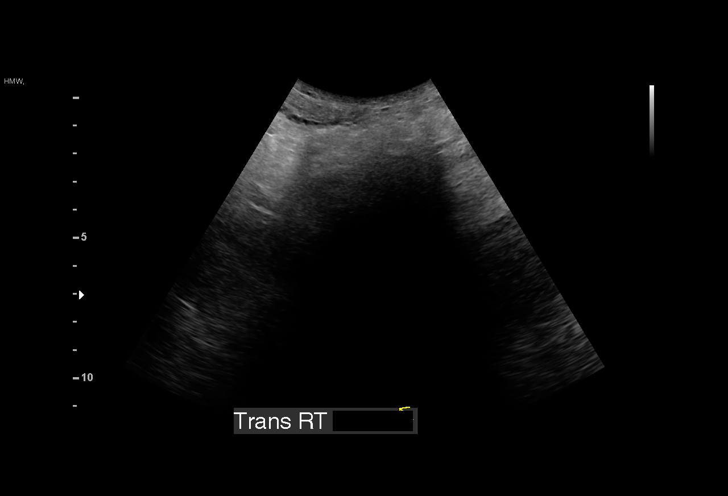
[im 14/45]
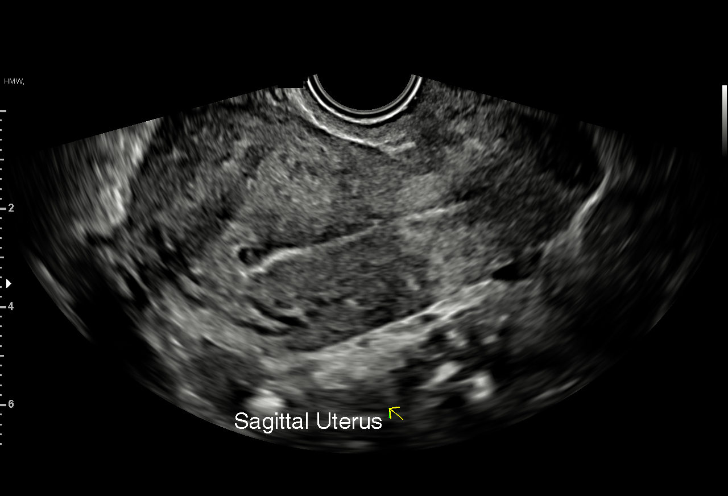
[im 17/45]
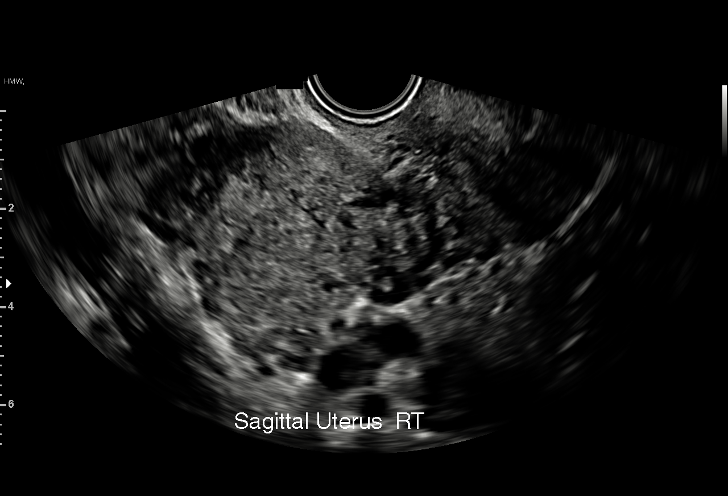
[im 20/45]
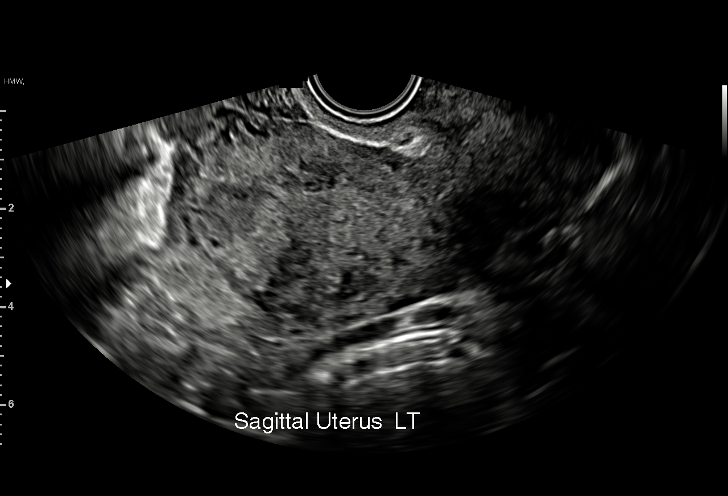
[im 23/45]
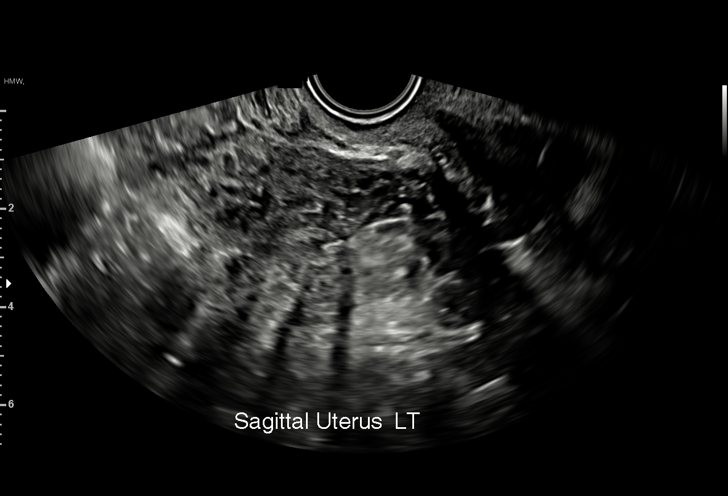
[im 25/45]
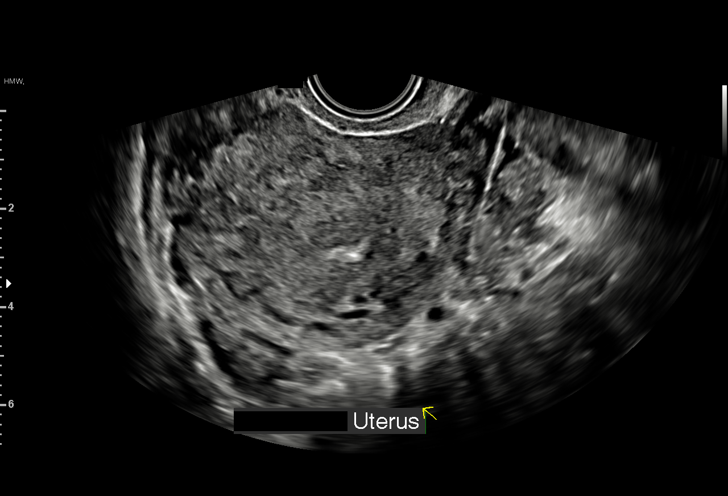
[im 28/45]
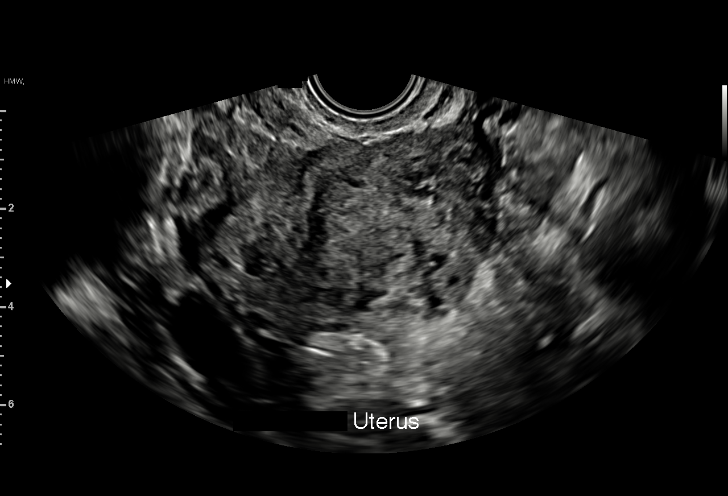
[im 31/45]
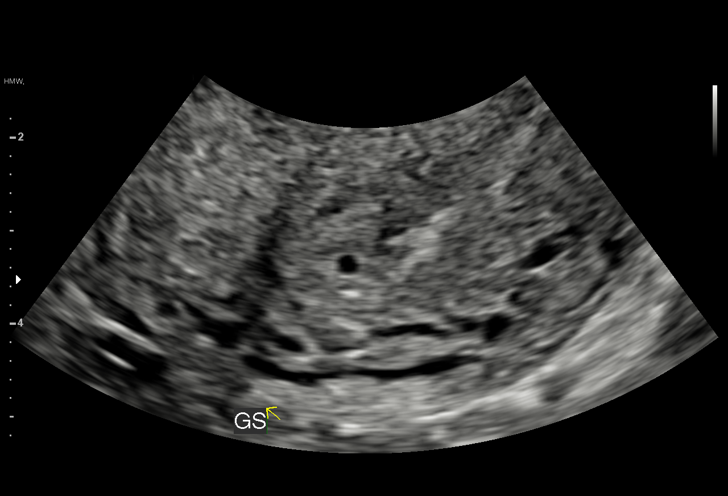
[im 35/45]
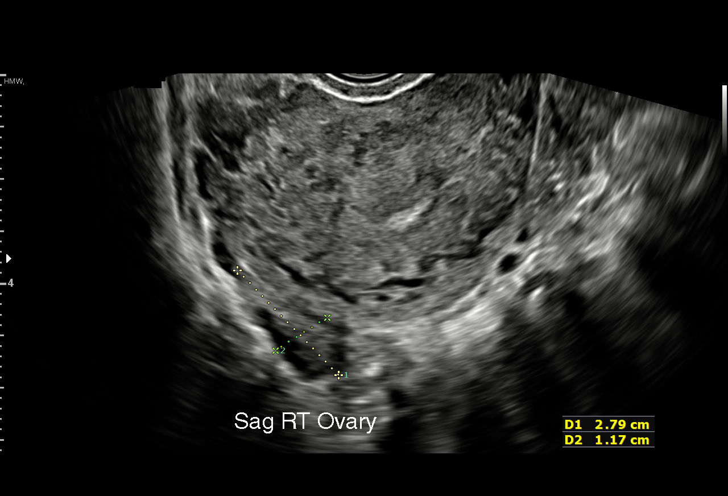
[im 38/45]
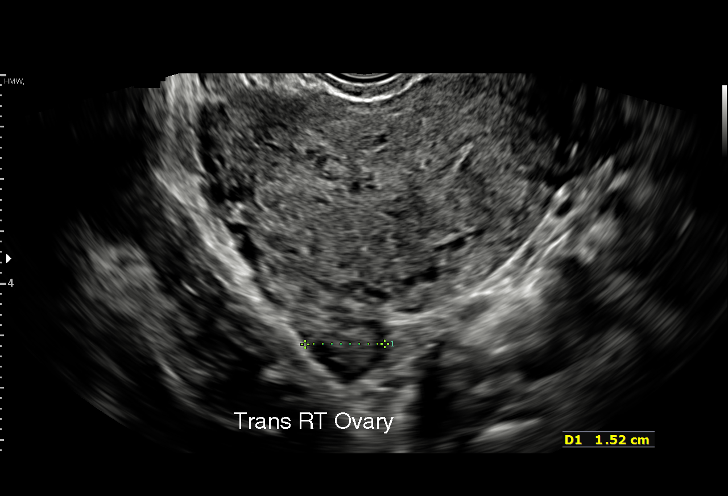
[im 41/45]
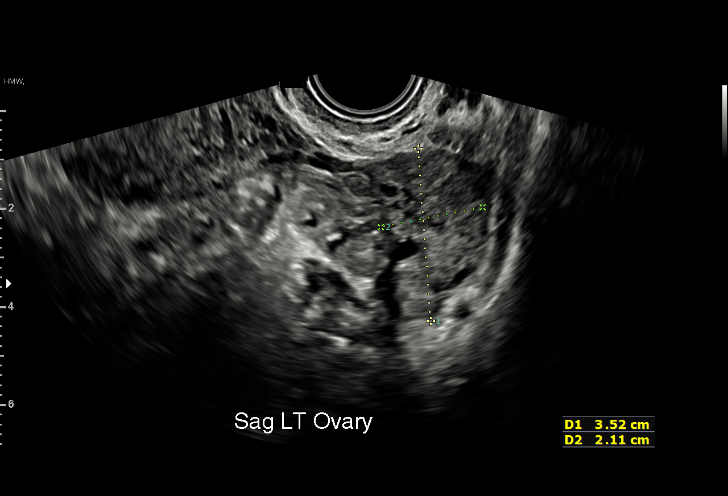
[im 45/45]
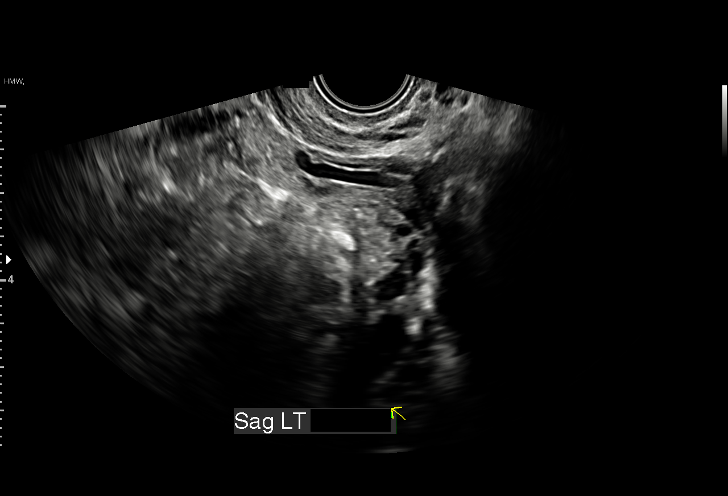

[15 of 28 positions shown; findings below may reference images not displayed]

FINDINGS: Intrauterine gestational sac: Tiny gestational sac is noted
corresponding to a 4 week 6 day gestation.

MSD: 2.7 mm   4 w   6 d

Subchorionic hemorrhage:  None visualized.

Maternal uterus/adnexae: Uterus is within normal limits. Ovaries
appear unremarkable.
IMPRESSION: Tiny intrauterine gestational sac at 4 weeks 6 days. Follow-up can
be performed as clinically indicated.

## 2021-09-17 ENCOUNTER — Encounter: Payer: Self-pay | Admitting: Obstetrics and Gynecology

## 2021-09-17 ENCOUNTER — Ambulatory Visit (INDEPENDENT_AMBULATORY_CARE_PROVIDER_SITE_OTHER): Payer: Medicaid Other | Admitting: Obstetrics and Gynecology

## 2021-09-17 VITALS — BP 102/62 | HR 62 | Wt 154.7 lb

## 2021-09-17 DIAGNOSIS — Z3480 Encounter for supervision of other normal pregnancy, unspecified trimester: Secondary | ICD-10-CM

## 2021-09-18 ENCOUNTER — Encounter: Payer: Medicaid Other | Admitting: Obstetrics and Gynecology

## 2021-09-19 ENCOUNTER — Other Ambulatory Visit: Payer: Self-pay

## 2021-09-19 ENCOUNTER — Inpatient Hospital Stay (HOSPITAL_COMMUNITY)
Admission: AD | Admit: 2021-09-19 | Discharge: 2021-09-20 | Disposition: A | Discharge status: Home / Self Care | Payer: Medicaid Other | Attending: Obstetrics & Gynecology | Admitting: Obstetrics & Gynecology

## 2021-09-19 ENCOUNTER — Encounter (HOSPITAL_COMMUNITY): Payer: Self-pay | Admitting: Obstetrics & Gynecology

## 2021-09-19 DIAGNOSIS — O26899 Other specified pregnancy related conditions, unspecified trimester: Secondary | ICD-10-CM

## 2021-09-19 DIAGNOSIS — Z3A39 39 weeks gestation of pregnancy: Secondary | ICD-10-CM | POA: Insufficient documentation

## 2021-09-19 DIAGNOSIS — R102 Pelvic and perineal pain: Secondary | ICD-10-CM | POA: Insufficient documentation

## 2021-09-19 DIAGNOSIS — O479 False labor, unspecified: Secondary | ICD-10-CM

## 2021-09-19 DIAGNOSIS — O471 False labor at or after 37 completed weeks of gestation: Secondary | ICD-10-CM | POA: Insufficient documentation

## 2021-09-19 DIAGNOSIS — F419 Anxiety disorder, unspecified: Secondary | ICD-10-CM | POA: Diagnosis present

## 2021-09-19 DIAGNOSIS — Z3689 Encounter for other specified antenatal screening: Secondary | ICD-10-CM

## 2021-09-19 DIAGNOSIS — Z3480 Encounter for supervision of other normal pregnancy, unspecified trimester: Secondary | ICD-10-CM

## 2021-09-19 NOTE — MAU Note (Signed)
.  Valerie Newman is a 21 y.o. at [redacted]w[redacted]d here in MAU reporting: she is havine sharp pain in her pelvic area off and on that started about an hour ago. Stated she tripped up the stairs earlier today but did not fall or hit her stomach. Good fetal movement felt. Denies any vag bleeding or leaking at this time.  ? ?Onset of complaint: 1 hr ?Pain score: 7 ?Vitals:  ? 09/19/21 2327  ?BP: 108/71  ?Pulse: 85  ?Resp: 18  ?Temp: (!) 97.5 ?F (36.4 ?C)  ?   ?FHT:136 ?Lab orders placed from triage:  none ? ?

## 2021-09-20 DIAGNOSIS — Z3689 Encounter for other specified antenatal screening: Secondary | ICD-10-CM | POA: Diagnosis not present

## 2021-09-20 DIAGNOSIS — O26899 Other specified pregnancy related conditions, unspecified trimester: Secondary | ICD-10-CM | POA: Diagnosis not present

## 2021-09-20 DIAGNOSIS — R102 Pelvic and perineal pain: Secondary | ICD-10-CM

## 2021-09-20 DIAGNOSIS — O471 False labor at or after 37 completed weeks of gestation: Secondary | ICD-10-CM | POA: Diagnosis not present

## 2021-09-20 DIAGNOSIS — Z3A39 39 weeks gestation of pregnancy: Secondary | ICD-10-CM | POA: Diagnosis not present

## 2021-09-20 NOTE — MAU Provider Note (Signed)
S: Patient is here for RN labor evaluation. Fetal tracing, vital signs, & chart reviewed . Patient endorses irregular contractions as well as "sharp" left groin pain which radiates down her thigh. This is a recurrent problem. She denies vaginal bleeding, leaking of fluid, decreased fetal movement, fever, falls, or recent illness.  ? ? ?O:  ?Vitals:  ? 09/19/21 2327  ?BP: 108/71  ?Pulse: 85  ?Resp: 18  ?Temp: (!) 97.5 ?F (36.4 ?C)  ?Weight: 70.8 kg  ?Height: 5\' 3"  (1.6 m)  ? ?No results found for this or any previous visit (from the past 24 hour(s)). ? ?Dilation: 2 ?Effacement (%): 60 ?Cervical Position: Posterior ?Station: -2 ?Presentation: Vertex ?Exam by:: Ardelle Lesches, RN ? ? ?FHR: 125 bpm, Mod Var, No Decels, 15 x 15 Accels ?UC: q 3-5 min ? ? ?A: ?1. Supervision of other normal pregnancy, antepartum   ?2. Non-stress test reactive   ?3. Braxton Hick's contraction Cervix unchanged at 2cm 90 min after initial assessment  ?4. Pelvic pain in pregnancy CNM at bedside for discussion of pelvic pain at 39 weeks  ? ?P:  ?RN to discharge home in stable condition with return precautions & fetal kick counts ? ?Darlina Rumpf, CNM ?09/20/21 ?1:48 AM ? ?

## 2021-09-22 NOTE — Progress Notes (Signed)
? ?  PRENATAL VISIT NOTE ? ?Subjective:  ?Valerie Newman is a 21 y.o. G2P1001 at [redacted]w[redacted]d being seen today for ongoing prenatal care.  She is currently monitored for the following issues for this low-risk pregnancy and has Supervision of other normal pregnancy, antepartum; Anxiety in pregnancy in second trimester, antepartum; Infusion reaction; and Hemorrhoids during pregnancy in third trimester on their problem list. ? ?Patient reports no complaints.  Contractions: Irritability. Vag. Bleeding: None.  Movement: Present. Denies leaking of fluid.  ? ?The following portions of the patient's history were reviewed and updated as appropriate: allergies, current medications, past family history, past medical history, past social history, past surgical history and problem list.  ? ?Objective:  ? ?Vitals:  ? 09/24/21 1431  ?BP: 103/69  ?Pulse: 93  ?Weight: 158 lb (71.7 kg)  ? ? ?Fetal Status: Fetal Heart Rate (bpm): 143 Fundal Height: 38 cm Movement: Present    ? ?General:  Alert, oriented and cooperative. Patient is in no acute distress.  ?Skin: Skin is warm and dry. No rash noted.   ?Cardiovascular: Normal heart rate noted  ?Respiratory: Normal respiratory effort, no problems with respiration noted  ?Abdomen: Soft, gravid, appropriate for gestational age.  Pain/Pressure: Present     ?Pelvic: Cervical exam performed in the presence of a chaperone Dilation: 3 Effacement (%): 70 Station: -1  ?Extremities: Normal range of motion.  Edema: None  ?Mental Status: Normal mood and affect. Normal behavior. Normal judgment and thought content.  ? ?Assessment and Plan:  ?Pregnancy: G2P1001 at [redacted]w[redacted]d ?1. Supervision of other normal pregnancy, antepartum ?Has IOL scheduled for 4/14 - Process reviewed.  ?Offered membrane sweep - she accepts and it was performed.  ? ?Term labor symptoms and general obstetric precautions including but not limited to vaginal bleeding, contractions, leaking of fluid and fetal movement were reviewed in detail with  the patient. ?Please refer to After Visit Summary for other counseling recommendations.  ? ?Return in about 6 weeks (around 11/05/2021) for postpartum visit. ? ?Future Appointments  ?Date Time Provider [place]  ?09/26/2021  7:00 AM [facility] SCHED ROOM [facility] None  ? ? ?Radene Gunning, MD ?

## 2021-09-24 ENCOUNTER — Encounter: Payer: Self-pay | Admitting: Obstetrics and Gynecology

## 2021-09-24 ENCOUNTER — Ambulatory Visit (INDEPENDENT_AMBULATORY_CARE_PROVIDER_SITE_OTHER): Payer: Medicaid Other | Admitting: Obstetrics and Gynecology

## 2021-09-24 VITALS — BP 103/69 | HR 93 | Wt 158.0 lb

## 2021-09-24 DIAGNOSIS — Z3480 Encounter for supervision of other normal pregnancy, unspecified trimester: Secondary | ICD-10-CM

## 2021-09-25 ENCOUNTER — Inpatient Hospital Stay (HOSPITAL_COMMUNITY): Payer: Medicaid Other | Admitting: Anesthesiology

## 2021-09-25 ENCOUNTER — Encounter: Payer: Medicaid Other | Admitting: Obstetrics and Gynecology

## 2021-09-25 ENCOUNTER — Encounter (HOSPITAL_COMMUNITY): Payer: Self-pay | Admitting: Obstetrics & Gynecology

## 2021-09-25 ENCOUNTER — Inpatient Hospital Stay (HOSPITAL_COMMUNITY)
Admission: AD | Admit: 2021-09-25 | Discharge: 2021-09-26 | DRG: 807 | Disposition: A | Discharge status: Home / Self Care | Payer: Medicaid Other | Attending: Family Medicine | Admitting: Family Medicine

## 2021-09-25 ENCOUNTER — Other Ambulatory Visit: Payer: Self-pay

## 2021-09-25 DIAGNOSIS — Z3A39 39 weeks gestation of pregnancy: Secondary | ICD-10-CM

## 2021-09-25 DIAGNOSIS — O479 False labor, unspecified: Principal | ICD-10-CM

## 2021-09-25 DIAGNOSIS — O26893 Other specified pregnancy related conditions, third trimester: Secondary | ICD-10-CM | POA: Diagnosis present

## 2021-09-25 DIAGNOSIS — Z3490 Encounter for supervision of normal pregnancy, unspecified, unspecified trimester: Secondary | ICD-10-CM

## 2021-09-25 LAB — CBC
HCT: 38 % (ref 36.0–46.0)
Hemoglobin: 11.8 g/dL — ABNORMAL LOW (ref 12.0–15.0)
MCH: 25 pg — ABNORMAL LOW (ref 26.0–34.0)
MCHC: 31.1 g/dL (ref 30.0–36.0)
MCV: 80.5 fL (ref 80.0–100.0)
Platelets: 238 10*3/uL (ref 150–400)
RBC: 4.72 MIL/uL (ref 3.87–5.11)
RDW: 17.8 % — ABNORMAL HIGH (ref 11.5–15.5)
WBC: 10.8 10*3/uL — ABNORMAL HIGH (ref 4.0–10.5)
nRBC: 0 % (ref 0.0–0.2)

## 2021-09-25 LAB — TYPE AND SCREEN
ABO/RH(D): A POS
Antibody Screen: NEGATIVE

## 2021-09-25 LAB — RPR: RPR Ser Ql: NONREACTIVE

## 2021-09-25 MED ORDER — ONDANSETRON HCL 4 MG/2ML IJ SOLN: 4.0000 mg | INTRAMUSCULAR | Status: DC | PRN

## 2021-09-25 MED ORDER — PHENYLEPHRINE 40 MCG/ML (10ML) SYRINGE FOR IV PUSH (FOR BLOOD PRESSURE SUPPORT): 80.0000 ug | PREFILLED_SYRINGE | INTRAVENOUS | Status: DC | PRN

## 2021-09-25 MED ORDER — LACTATED RINGERS IV SOLN
500.0000 mL | Freq: Once | INTRAVENOUS | Status: AC
  Administered 2021-09-25: 500 mL via INTRAVENOUS

## 2021-09-25 MED ORDER — ACETAMINOPHEN 325 MG PO TABS: 650.0000 mg | ORAL_TABLET | ORAL | Status: DC | PRN

## 2021-09-25 MED ORDER — FENTANYL-BUPIVACAINE-NACL 0.5-0.125-0.9 MG/250ML-% EP SOLN
12.0000 mL/h | EPIDURAL | Status: DC | PRN
  Filled 2021-09-25: qty 250

## 2021-09-25 MED ORDER — FLEET ENEMA 7-19 GM/118ML RE ENEM: 1.0000 | ENEMA | RECTAL | Status: DC | PRN

## 2021-09-25 MED ORDER — PRENATAL MULTIVITAMIN CH
1.0000 | ORAL_TABLET | Freq: Every day | ORAL | Status: DC
  Administered 2021-09-25 – 2021-09-26 (×2): 1 via ORAL
  Filled 2021-09-25 (×2): qty 1

## 2021-09-25 MED ORDER — OXYTOCIN BOLUS FROM INFUSION
333.0000 mL | Freq: Once | INTRAVENOUS | Status: AC
  Administered 2021-09-25: 333 mL via INTRAVENOUS

## 2021-09-25 MED ORDER — MISOPROSTOL 25 MCG QUARTER TABLET: 25.0000 ug | ORAL_TABLET | ORAL | Status: DC | PRN

## 2021-09-25 MED ORDER — LIDOCAINE HCL (PF) 1 % IJ SOLN
INTRAMUSCULAR | Status: DC | PRN
  Administered 2021-09-25: 5 mL via EPIDURAL

## 2021-09-25 MED ORDER — ZOLPIDEM TARTRATE 5 MG PO TABS: 5.0000 mg | ORAL_TABLET | Freq: Every evening | ORAL | Status: DC | PRN

## 2021-09-25 MED ORDER — LACTATED RINGERS IV SOLN
INTRAVENOUS | Status: DC
Start: 2021-09-25 — End: 2021-09-25

## 2021-09-25 MED ORDER — COCONUT OIL OIL
1.0000 "application " | TOPICAL_OIL | Status: DC | PRN
  Administered 2021-09-26: 1 via TOPICAL

## 2021-09-25 MED ORDER — BENZOCAINE-MENTHOL 20-0.5 % EX AERO
1.0000 "application " | INHALATION_SPRAY | CUTANEOUS | Status: DC | PRN
  Administered 2021-09-25 – 2021-09-26 (×2): 1 via TOPICAL
  Filled 2021-09-25 (×2): qty 56

## 2021-09-25 MED ORDER — SIMETHICONE 80 MG PO CHEW: 80.0000 mg | CHEWABLE_TABLET | ORAL | Status: DC | PRN

## 2021-09-25 MED ORDER — WITCH HAZEL-GLYCERIN EX PADS: 1.0000 "application " | MEDICATED_PAD | CUTANEOUS | Status: DC | PRN

## 2021-09-25 MED ORDER — ACETAMINOPHEN 325 MG PO TABS
650.0000 mg | ORAL_TABLET | ORAL | Status: DC | PRN
  Administered 2021-09-25 – 2021-09-26 (×3): 650 mg via ORAL
  Filled 2021-09-25 (×3): qty 2

## 2021-09-25 MED ORDER — ONDANSETRON HCL 4 MG PO TABS: 4.0000 mg | ORAL_TABLET | ORAL | Status: DC | PRN

## 2021-09-25 MED ORDER — FENTANYL-BUPIVACAINE-NACL 0.5-0.125-0.9 MG/250ML-% EP SOLN
EPIDURAL | Status: DC | PRN
  Administered 2021-09-25: 12 mL/h via EPIDURAL

## 2021-09-25 MED ORDER — DIPHENHYDRAMINE HCL 50 MG/ML IJ SOLN: 12.5000 mg | INTRAMUSCULAR | Status: DC | PRN

## 2021-09-25 MED ORDER — SENNOSIDES-DOCUSATE SODIUM 8.6-50 MG PO TABS
2.0000 | ORAL_TABLET | Freq: Every day | ORAL | Status: DC
  Administered 2021-09-26: 2 via ORAL
  Filled 2021-09-25: qty 2

## 2021-09-25 MED ORDER — SOD CITRATE-CITRIC ACID 500-334 MG/5ML PO SOLN: 30.0000 mL | ORAL | Status: DC | PRN

## 2021-09-25 MED ORDER — OXYCODONE HCL 5 MG PO TABS
5.0000 mg | ORAL_TABLET | Freq: Four times a day (QID) | ORAL | Status: DC | PRN
Start: 2021-09-25 — End: 2021-09-26
  Administered 2021-09-25: 5 mg via ORAL
  Filled 2021-09-25: qty 1

## 2021-09-25 MED ORDER — TERBUTALINE SULFATE 1 MG/ML IJ SOLN: 0.2500 mg | Freq: Once | INTRAMUSCULAR | Status: DC | PRN

## 2021-09-25 MED ORDER — LIDOCAINE HCL (PF) 1 % IJ SOLN: 30.0000 mL | INTRAMUSCULAR | Status: DC | PRN

## 2021-09-25 MED ORDER — EPHEDRINE 5 MG/ML INJ: 10.0000 mg | INTRAVENOUS | Status: DC | PRN

## 2021-09-25 MED ORDER — OXYTOCIN-SODIUM CHLORIDE 30-0.9 UT/500ML-% IV SOLN
2.5000 [IU]/h | INTRAVENOUS | Status: DC
  Administered 2021-09-25: 2.5 [IU]/h via INTRAVENOUS
  Filled 2021-09-25: qty 500

## 2021-09-25 MED ORDER — OXYCODONE-ACETAMINOPHEN 5-325 MG PO TABS: 2.0000 | ORAL_TABLET | ORAL | Status: DC | PRN

## 2021-09-25 MED ORDER — DIBUCAINE (PERIANAL) 1 % EX OINT: 1.0000 "application " | TOPICAL_OINTMENT | CUTANEOUS | Status: DC | PRN

## 2021-09-25 MED ORDER — TETANUS-DIPHTH-ACELL PERTUSSIS 5-2.5-18.5 LF-MCG/0.5 IM SUSY: 0.5000 mL | PREFILLED_SYRINGE | Freq: Once | INTRAMUSCULAR | Status: DC

## 2021-09-25 MED ORDER — ONDANSETRON HCL 4 MG/2ML IJ SOLN: 4.0000 mg | Freq: Four times a day (QID) | INTRAMUSCULAR | Status: DC | PRN

## 2021-09-25 MED ORDER — IBUPROFEN 600 MG PO TABS
600.0000 mg | ORAL_TABLET | Freq: Four times a day (QID) | ORAL | Status: DC
  Administered 2021-09-25 – 2021-09-26 (×5): 600 mg via ORAL
  Filled 2021-09-25 (×5): qty 1

## 2021-09-25 MED ORDER — OXYCODONE-ACETAMINOPHEN 5-325 MG PO TABS: 1.0000 | ORAL_TABLET | ORAL | Status: DC | PRN

## 2021-09-25 MED ORDER — FENTANYL CITRATE (PF) 100 MCG/2ML IJ SOLN
100.0000 ug | INTRAMUSCULAR | Status: DC | PRN
  Administered 2021-09-25: 100 ug via INTRAVENOUS
  Filled 2021-09-25: qty 2

## 2021-09-25 MED ORDER — LACTATED RINGERS IV SOLN
500.0000 mL | INTRAVENOUS | Status: DC | PRN
  Administered 2021-09-25: 1000 mL via INTRAVENOUS

## 2021-09-25 MED ORDER — DIPHENHYDRAMINE HCL 25 MG PO CAPS: 25.0000 mg | ORAL_CAPSULE | Freq: Four times a day (QID) | ORAL | Status: DC | PRN

## 2021-09-25 NOTE — Progress Notes (Signed)
OB/GYN Faculty Practice Delivery Note ? ?Valerie Newman is a 21 y.o. G2P1001 s/p SVD at [redacted]w[redacted]d. She was admitted for SOL.  ? ?ROM: 0h 51m with clear fluid ?GBS Status: Negative ? ? ?Delivery Date/Time: 09/25/2021 at 6:41 ?Delivery: Called to room and patient was complete and pushing. Head delivered . No nuchal cord present. Shoulder and body delivered in usual fashion. Infant with spontaneous cry, placed on mother's abdomen, dried and stimulated. Cord clamped x 2 after 1-minute delay, and cut by Grandma under my direct supervision. Cord blood drawn. Placenta delivered spontaneously with gentle cord traction. Fundus firm with massage and Pitocin. Labia, perineum, vagina, and cervix were inspected, with no lacerations.  ? ?Placenta: complete, three vessel cord appreciated ?Complications: None ?Lacerations: None ?EBL: 200 mL ?Analgesia: Epidural ? ?Infant: Girl  APGARs 8 and 9  ? ?Alen Bleacher, MD ?Center for [place], [place]  ? ? ? ?  ?

## 2021-09-25 NOTE — MAU Note (Signed)
.  Valerie Newman is a 21 y.o. at [redacted]w[redacted]d here in MAU reporting: arrival via EMS with complaints of ctx every 2-3 minutes, 9/10 pain. Denies LOF and reports vaginal spotting. Endorses +FM. Jimmye Norman, CNM at bedside at time of patient arrival. ?Pain score: 9/10 ?Vitals:  ? 09/25/21 0314  ?BP: 108/72  ?Pulse: 98  ?Resp: 20  ?Temp: (!) 97.5 ?F (36.4 ?C)  ?SpO2: 100%  ?   ?FHT:144 bpm ? ? ?

## 2021-09-25 NOTE — H&P (Addendum)
OBSTETRIC ADMISSION HISTORY AND PHYSICAL ? ?Valerie Newman is a 21 y.o. female G2P1001 with IUP at [redacted]w[redacted]d by LMP presenting for SOL . She reports +FMs, No LOF, no VB, no blurry vision, headaches or peripheral edema, and RUQ pain.  She plans on breastfeeding feeding. She requests IUD for birth control. ?She received her prenatal care at [facility]  ? ?Dating: By LMP --->  Estimated Date of Delivery: 09/26/21 ? ?Sono:   ? ?Last [place] was done at 23wks. Growth at the time was 23%. Presentation on admission was vertex. ? ? ?Prenatal History/Complications:  ?-Silent Alpha Thal Carrier  ?-LR NIPS ?-EIF ? ?Past Medical History: ?Past Medical History:  ?Diagnosis Date  ? 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 Allergy to pollen   ? Anemia   ? Anxiety   ? Asthma   ? ? ?Past Surgical History: ?Past Surgical History:  ?Procedure Laterality Date  ? NO PAST SURGERIES    ? TOOTH EXTRACTION    ? ? ?Obstetrical History: ?OB History   ? ? Gravida  ?2  ? Para  ?1  ? Term  ?1  ? Preterm  ?0  ? AB  ?0  ? Living  ?1  ?  ? ? SAB  ?0  ? IAB  ?0  ? Ectopic  ?0  ? Multiple  ?0  ? Live Births  ?1  ?   ?  ?  ? ? ?Social History ?Social History  ? ?Socioeconomic History  ? Marital status: Single  ?  Spouse name: Not on file  ? Number of children: 1  ? Years of education: Not on file  ? Highest education level: High school graduate  ?Occupational History  ? Not on file  ?Tobacco Use  ? Smoking status: Never  ?  Passive exposure: Never  ? Smokeless tobacco: Never  ?Vaping Use  ? Vaping Use: Never used  ?Substance and Sexual Activity  ? Alcohol use: No  ? Drug use: No  ? Sexual activity: Yes  ?Other Topics Concern  ? Not on file  ?Social History Narrative  ? Not on file  ? ?Social Determinants of Health  ? ?Financial Resource Strain: Not on file  ?Food Insecurity: No Food Insecurity  ? Worried About Programme researcher, broadcasting/film/video in the Last Year: Never true  ? Ran Out of Food in the Last Year: Never true  ?Transportation Needs: No Transportation Needs  ? Lack of  Transportation (Medical): No  ? Lack of Transportation (Non-Medical): No  ?Physical Activity: Not on file  ?Stress: Not on file  ?Social Connections: Not on file  ? ? ?Family History: ?Family History  ?Problem Relation Age of Onset  ? Heart disease Mother   ? Stroke Mother   ? Hypertension Maternal Grandmother   ? Diabetes Maternal Grandmother   ? Diabetes Maternal Grandfather   ? Diabetes Maternal Uncle   ? Diabetes Maternal Aunt   ? ? ?Allergies: ?Allergies  ?Allergen Reactions  ? Bee Venom Swelling  ? Metronidazole Nausea And Vomiting  ? Wasp Venom Swelling  ? ? ?Pt denies allergies to latex, iodine, or shellfish. ? ?No medications prior to admission.  ? ? ? ?Review of Systems  ? ?All systems reviewed and negative except as stated in HPI ? ?Blood pressure 108/72, pulse 98, temperature (!) 97.5 ?F (36.4 ?C), temperature source Oral, resp. rate 20, last menstrual period 12/20/2020, SpO2 100 %. ?General appearance: alert, cooperative, and appears stated age ?Lungs: clear  to auscultation bilaterally ?Heart: regular rate and rhythm ?Abdomen: soft, non-tender; bowel sounds normal ?Extremities: Homans sign is negative, no sign of DVT ?Presentation: cephalic ?Fetal monitoringBaseline: 130 bpm, Variability: Good {> 6 bpm), and Accelerations: Reactive ?Uterine activityFrequency: Every 2-3 minutes ?Dilation: 4 ?Effacement (%): 50 ?Exam by:: Valerie Shores, RN ? ? ?Prenatal labs: ?ABO, Rh: --/--/PENDING (04/13 9030) ?Antibody: PENDING (04/13 0325) ?Rubella: 9.22 (09/27 0859) ?RPR: Non Reactive (01/18 0923)  ?HBsAg: Negative (09/27 0859)  ?HIV: Non Reactive (01/18 3007)  ?GBS: Negative/-- (03/16 1120)  ?1 hr Glucola: Normal ?Genetic screening:  Silent Alpha Thal carrier, LR NIPS ?Anatomy US: Normal ? ?Prenatal Transfer Tool  ?Maternal Diabetes: No ?Genetic Screening: Abnormal:  Results: Other:Alpha thal carrier, LR NIPS ?Maternal Ultrasounds/Referrals: Normal ?Fetal Ultrasounds or other Referrals:  None ?Maternal Substance  Abuse:  No ?Significant Maternal Medications:  None ?Significant Maternal Lab Results: Group B Strep negative ? ?Results for orders placed or performed during the hospital encounter of 09/25/21 (from the past 24 hour(s))  ?Type and screen  ? Collection Time: 09/25/21  3:25 AM  ?Result Value Ref Range  ? ABO/RH(D) PENDING   ? Antibody Screen PENDING   ? Sample Expiration    ?  09/28/2021,2359 ?Performed at [facility], [address], [place], [place] [address] ?  ? ? ?Patient Active Problem List  ? Diagnosis Date Noted  ? Term pregnancy 09/25/2021  ? Hemorrhoids during pregnancy in third trimester 08/28/2021  ? Infusion reaction 07/15/2021  ? Anxiety in pregnancy in second trimester, antepartum 05/06/2021  ? Supervision of other normal pregnancy, antepartum 03/11/2021  ? ? ?Assessment/Plan:  ?Valerie Newman is a 21 y.o. G2P1001 at [redacted]w[redacted]d here forSOL ? ?#Labor:Progressing well. Continue care plan and will reassess patient in 2-3 hrs. ?#Pain: IV PRN ?#FWB: Cat 1 ?#ID:  GBS negative ?#MOF: Breast feeding ?#MOC: PPIUD ? ?Valerie Simon, MD  ?Center for [facility] ?09/25/2021, 3:50 AM ? ? Attestation: ? ?I confirm that I have verified the information documented in the resident?s note and that I have also personally reperformed the physical exam and all medical decision making activities.  ? ?The patient was seen and examined by me also ?Agree with note ?NST reactive and reassuring ?UCs as listed ?Cervical exams as listed in note ? ?Valerie Newman, CNM ? ?

## 2021-09-25 NOTE — Anesthesia Procedure Notes (Signed)
Epidural ?Patient location during procedure: OB ?Start time: 09/25/2021 4:22 AM ? ?Staffing ?Anesthesiologist: Trevor Iha, MD ?Performed: anesthesiologist  ? ?Preanesthetic Checklist ?Completed: patient identified, IV checked, site marked, risks and benefits discussed, surgical consent, monitors and equipment checked, pre-op evaluation and timeout performed ? ?Epidural ?Patient position: sitting ?Prep: DuraPrep and site prepped and draped ?Patient monitoring: continuous pulse ox and blood pressure ?Approach: midline ?Location: L3-L4 ?Injection technique: LOR air ? ?Needle:  ?Needle type: Tuohy  ?Needle gauge: 17 G ?Needle length: 9 cm and 9 ?Needle insertion depth: 6 cm ?Catheter type: closed end flexible ?Catheter size: 19 Gauge ?Catheter at skin depth: 11 cm ?Test dose: negative ? ?Assessment ?Events: blood not aspirated, injection not painful, no injection resistance, no paresthesia and negative IV test ? ?Additional Notes ?Patient identified. Risks/Benefits/Options discussed with patient including but not limited to bleeding, infection, nerve damage, paralysis, failed block, incomplete pain control, headache, blood pressure changes, nausea, vomiting, reactions to medication both or allergic, itching and postpartum back pain. Confirmed with bedside nurse the patient's most recent platelet count. Confirmed with patient that they are not currently taking any anticoagulation, have any bleeding history or any family history of bleeding disorders. Patient expressed understanding and wished to proceed. All questions were answered. Sterile technique was used throughout the entire procedure. Please see nursing notes for vital signs. Test dose was given through epidural needle and negative prior to continuing to dose epidural or start infusion. Warning signs of high block given to the patient including shortness of breath, tingling/numbness in hands, complete motor block, or any concerning symptoms with instructions  to call for help. Patient was given instructions on fall risk and not to get out of bed. All questions and concerns addressed with instructions to call with any issues.  1 Attempt (S) . Patient tolerated procedure well. ? ? ? ?

## 2021-09-25 NOTE — Anesthesia Postprocedure Evaluation (Signed)
Anesthesia Post Note ? ?Patient: Valerie Newman ? ?Procedure(s) Performed: AN AD HOC LABOR EPIDURAL ? ?  ? ?Patient location during evaluation: Mother Baby ?Anesthesia Type: Epidural ?Level of consciousness: awake, awake and alert and oriented ?Pain management: pain level controlled ?Vital Signs Assessment: post-procedure vital signs reviewed and stable ?Respiratory status: spontaneous breathing and respiratory function stable ?Cardiovascular status: blood pressure returned to baseline ?Postop Assessment: no headache, epidural receding, patient able to bend at knees, adequate PO intake, no backache, no apparent nausea or vomiting and able to ambulate ?Anesthetic complications: no ? ? ?No notable events documented. ? ?Last Vitals:  ?Vitals:  ? 09/25/21 0938 09/25/21 1300  ?BP: 115/73 110/73  ?Pulse: (!) 59 (!) 56  ?Resp: 18 18  ?Temp: 36.4 ?C 36.6 ?C  ?SpO2: 100% 100%  ?  ?Last Pain:  ?Vitals:  ? 09/25/21 1300  ?TempSrc: Oral  ?PainSc:   ? ?Pain Goal:   ? ?  ?  ?  ?  ?  ?  ?Epidural/Spinal Function Cutaneous sensation: Normal sensation (09/25/21 1300), Patient able to flex knees: Yes (09/25/21 1300), Patient able to lift hips off bed: Yes (09/25/21 1300), Back pain beyond tenderness at insertion site: No (09/25/21 1300), Progressively worsening motor and/or sensory loss: No (09/25/21 1300), Bowel and/or bladder incontinence post epidural: No (09/25/21 1300) ? ?Evelina Dun R ? ? ? ? ?

## 2021-09-25 NOTE — Lactation Note (Signed)
This note was copied from a baby's chart. ?Lactation Consultation Note ? ?Patient Name: Valerie Newman ?Today's Date: 09/25/2021 ?Reason for consult: L&D Initial assessment ?Age:22 hours ? ?P2, Infant cueing upon entering. ?Assisted with latching on L breast with ease. ?Mother states she has had more difficulty in the past latching on the R breast. ?Lactation to follow up on MBU. ? ?Maternal Data ?Does the patient have breastfeeding experience prior to this delivery?: Yes ?How long did the patient breastfeed?: 6 mos. ? ?LATCH Score ?Latch: Grasps breast easily, tongue down, lips flanged, rhythmical sucking. ? ?Audible Swallowing: A few with stimulation ? ?Type of Nipple: Everted at rest and after stimulation ? ?Comfort (Breast/Nipple): Soft / non-tender ? ?Hold (Positioning): Assistance needed to correctly position infant at breast and maintain latch. ? ?LATCH Score: 8 ? ?Interventions ?Interventions: Education;Assisted with latch;Skin to skin;Hand express ? ?Consult Status ?Consult Status: Follow-up from L&D ? ? ? ?Dahlia Byes Boschen ?09/25/2021, 7:25 AM ? ? ? ?

## 2021-09-25 NOTE — Anesthesia Preprocedure Evaluation (Signed)
Anesthesia Evaluation  ?Patient identified by MRN, date of birth, ID band ?Patient awake ? ? ? ?Reviewed: ?Allergy & Precautions, NPO status , Patient's Chart, lab work & pertinent test results ? ?Airway ?Mallampati: II ? ?TM Distance: >3 FB ?Neck ROM: Full ? ? ? Dental ?no notable dental hx. ?(+) Teeth Intact, Dental Advisory Given ?  ?Pulmonary ?asthma ,  ?  ?Pulmonary exam normal ?breath sounds clear to auscultation ? ? ? ? ? ? Cardiovascular ?Exercise Tolerance: Good ?Normal cardiovascular exam ?Rhythm:Regular Rate:Normal ? ? ?  ?Neuro/Psych ?Anxiety   ? GI/Hepatic ?negative GI ROS, Neg liver ROS,   ?Endo/Other  ?negative endocrine ROS ? Renal/GU ?negative Renal ROS  ? ?  ?Musculoskeletal ? ? Abdominal ?  ?Peds ? Hematology ?Lab Results ?     Component                Value               Date                 ?     WBC                      10.8 (H)            09/25/2021           ?     HGB                      11.8 (L)            09/25/2021           ?     HCT                      38.0                09/25/2021           ?     MCV                      80.5                09/25/2021           ?     PLT                      238                 09/25/2021           ?   ?Anesthesia Other Findings ? ? Reproductive/Obstetrics ?(+) Pregnancy ? ?  ? ? ? ? ? ? ? ? ? ? ? ? ? ?  ?  ? ? ? ? ? ? ? ? ?Anesthesia Physical ?Anesthesia Plan ? ?ASA: 2 ? ?Anesthesia Plan: Epidural  ? ?Post-op Pain Management:   ? ?Induction:  ? ?PONV Risk Score and Plan:  ? ?Airway Management Planned:  ? ?Additional Equipment:  ? ?Intra-op Plan:  ? ?Post-operative Plan:  ? ?Informed Consent: I have reviewed the patients History and Physical, chart, labs and discussed the procedure including the risks, benefits and alternatives for the proposed anesthesia with the patient or authorized representative who has indicated his/her understanding and acceptance.  ? ? ? ? ? ?Plan Discussed with:  ? ?Anesthesia Plan Comments:  (39.6 wk G2P1 for LEA)  ? ? ? ? ? ? ?  Anesthesia Quick Evaluation ? ?

## 2021-09-26 ENCOUNTER — Inpatient Hospital Stay (HOSPITAL_COMMUNITY): Payer: Medicaid Other

## 2021-09-26 ENCOUNTER — Inpatient Hospital Stay (HOSPITAL_COMMUNITY): Admission: AD | Admit: 2021-09-26 | Payer: Medicaid Other | Source: Home / Self Care | Admitting: Family Medicine

## 2021-09-26 MED ORDER — IBUPROFEN 600 MG PO TABS: 600.0000 mg | ORAL_TABLET | Freq: Four times a day (QID) | ORAL | 0 refills | Status: DC

## 2021-09-26 MED ORDER — CYCLOBENZAPRINE HCL 10 MG PO TABS: 10.0000 mg | ORAL_TABLET | Freq: Three times a day (TID) | ORAL | Status: DC | PRN

## 2021-09-26 NOTE — Discharge Summary (Signed)
? ?  Postpartum Discharge Summary ?   ?Patient Name: Valerie Newman ?DOB: 2000/12/21 ?MRN: [id number] ? ?Date of admission: 09/25/2021 ?Delivery date:09/25/2021  ?Delivering provider: Florian Buff  ?Date of discharge: 09/26/2021 ? ?Admitting diagnosis: Term pregnancy [Z34.90] ?Intrauterine pregnancy: [redacted]w[redacted]d    ?Secondary diagnosis:  Principal Problem: ?  Term pregnancy ? ?Additional problems:     ?Discharge diagnosis: Term Pregnancy Delivered                                              ?Post partum procedures: n/a ?Augmentation: N/A ?Complications: None ? ?Hospital course: Onset of Labor With Vaginal Delivery      ?21y.o. yo G2P2002 at 333w6das admitted in Active Labor on 09/25/2021. Patient had an uncomplicated labor course as follows:  ?Membrane Rupture Time/Date: 6:15 AM ,09/25/2021   ?Delivery Method:Vaginal, Spontaneous  ?Episiotomy: None  ?Lacerations:  None  ?Patient had an uncomplicated postpartum course.  She is ambulating, tolerating a regular diet, passing flatus, and urinating well. Patient is discharged home in stable condition on 09/26/21. ? ?Newborn Data: ?Birth date:09/25/2021  ?Birth time:6:41 AM  ?Gender:Female  ?Living status:Living  ?Apgars:8 ,9  ?Weight:3030 g  ? ?Magnesium Sulfate received: No ?BMZ received: No ?Rhophylac:No ?MMR:No ?T-DaP: declined ?Flu: No ?Transfusion:No ? ?Physical exam  ?Vitals:  ? 09/25/21 1300 09/25/21 1741 09/25/21 2049 09/26/21 0519  ?BP: 110/73 120/85 116/82 122/69  ?Pulse: (!) 56 (!) 54 66 68  ?Resp: _0 ?Temp: 97.8 ?F (36.6 ?C) 97.8 ?F (36.6 ?C) (!) 97.4 ?F (36.3 ?C) 98.2 ?F (36.8 ?C)  ?TempSrc: Oral Oral Oral Oral  ?SpO2: 100% 100% 100%   ? ?General: alert, cooperative, and no distress ?Lochia: appropriate ?Uterine Fundus: firm ?Incision: n/a ?DVT Evaluation: No evidence of DVT seen on physical exam. ?Labs: ?Lab Results  ?Component Value Date  ? WBC 10.8 (H) 09/25/2021  ? HGB 11.8 (L) 09/25/2021  ? HCT 38.0 09/25/2021  ? MCV 80.5 09/25/2021  ? PLT 238  09/25/2021  ? ? ?  Latest Ref Rng & Units 03/20/2021  ?  3:11 PM  ?CMP  ?Glucose 70 - 99 mg/dL 90    ?BUN 6 - 20 mg/dL 7    ?Creatinine 0.44 - 1.00 mg/dL 0.51    ?Sodium 135 - 145 mmol/L 135    ?Potassium 3.5 - 5.1 mmol/L 3.7    ?Chloride 98 - 111 mmol/L 105    ?CO2 22 - 32 mmol/L 24    ?Calcium 8.9 - 10.3 mg/dL 9.2    ?Total Protein 6.5 - 8.1 g/dL 6.7    ?Total Bilirubin 0.3 - 1.2 mg/dL 0.4    ?Alkaline Phos 38 - 126 U/L 43    ?AST 15 - 41 U/L 17    ?ALT 0 - 44 U/L 10    ? ?Edinburgh Score: ? ?  12/01/2017  ?  9:58 AM  ?EdFlavia Shipperostnatal Depression Scale Screening Tool  ?I have been able to laugh and see the funny side of things. 1  ?I have looked forward with enjoyment to things. 1  ?I have blamed myself unnecessarily when things went wrong. 1  ?I have been anxious or worried for no good reason. 0  ?I have felt scared or panicky for no good reason. 0  ?Things have been getting on top of me. 0  ?I have been so  unhappy that I have had difficulty sleeping. 1  ?I have felt sad or miserable. 2  ?I have been so unhappy that I have been crying. 2  ?The thought of harming myself has occurred to me. 0  ?Edinburgh Postnatal Depression Scale Total 8  ? ? ? ?After visit meds:  ?Allergies as of 09/26/2021   ? ?   Reactions  ? Bee Venom Swelling  ? Metronidazole Nausea And Vomiting  ? Wasp Venom Swelling  ? ?  ? ?  ?Medication List  ?  ? ?TAKE these medications   ? ?ibuprofen 600 MG tablet ?Commonly known as: ADVIL ?Take 1 tablet (600 mg total) by mouth every 6 (six) hours. ?  ? ?  ? ? ? ?Discharge home in stable condition ?Infant Feeding: Bottle and Breast ?Infant Disposition:home with mother ?Discharge instruction: per After Visit Summary and Postpartum booklet. ?Activity: Advance as tolerated. Pelvic rest for 6 weeks.  ?Diet: routine diet ?Future Appointments: ?Future Appointments  ?Date Time Provider [place]  ?10/31/2021 10:15 AM Radene Gunning, MD [facility]  ? ?Follow up Visit: ? Follow-up Information   ? ? Center  for [facility] Follow up.   ?Specialty: Obstetrics and Gynecology ?Why: As scheduled for prenatal care ?Contact information: ?[place] ?[place] [id number] ?[phone] ? ?  ?  ? ?  ?  ? ?  ? ? ?09/26/2021 ?Wende Mott, CNM ? ? ? ?

## 2021-09-26 NOTE — Social Work (Addendum)
CSW received consult for hx of Anxiety. CSW met with MOB to offer support and complete assessment.   ? ?CSW met with MOB at bedside and introduced CSW role. CSW observed MOB sitting in bed, maternal grandmother, and her aunt were present at bedside. CSW offered MOB privacy. MOB gave CSW permission to share information in front of her mom and aunt. MOB identified them as supports. MOB presented pleasant and welcomed CSW visit. CSW inquired how MOB has felt since giving birth. MOB reported ?feeling good besides the pain.? MOB reported the labor and delivery was quicker in comparison to her first birth. CSW inquired how MOB felt emotionally during the pregnancy. MOB reported that she ?felt good.? CSW inquired about MOB history of anxiety. MOB acknowledged her history of anxiety. Maternal grandmother politely interjected and stated that MOB had her first panic attack in elementary school, diagnosed with anxiety and prescribed medication for symptoms. MOB reported it has been a while since she had a panic attack and has taken medication for anxiety. MOB reported she was seeing a counselor weekly at [facility] in February 2022 as a requirement while living at [facility]. MOB reported the therapy was helpful and she has not needed it since then. MOB reported she is opening to seeing counselor again, if needed. 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 MOB reported having a lot of stressors at the time and that she cried a lot. MOB reported the PPD lasted for eight months. MOB encouraged MOB to reach out to medical professional this time if she has concerns and emphasize there are resources available to help. MOB reported understanding. CSW inquired about MOB coping skills. MOB shared she listens to music, runs track and watches [employer]. CSW encouraged MOB to continue using her coping skills and doing things that make her happy.  ? ?CSW provided education regarding the baby  blues period vs. perinatal mood disorders, discussed treatment and gave resources for mental health follow up if concerns arise.  CSW recommended MOB self-evaluation during the postpartum time period using the New Mom Checklist from Postpartum Progress. CSW assessed MOB for safety. MOB denied thoughts of harm to self and others.  ? ?CSW provided review of Sudden Infant Death Syndrome (SIDS) precautions. MOB reported understanding. MOB reported she has essential items for the infant including a bassinet where the infant will sleep. MOB has chosen [facility] for the infant's follow up care. MOB reported she receives WIC/FS and called to update WIC about the birth. MOB reported she will use FS to purchase the infants milk until her appointment. CSW educated MOB about [facility]. MOB reported she has the information to follow if she decided to use their services. CSW assessed MOB for additional needs. MOB reported no further need.  ? ?CSW identifies no further need for intervention and no barriers to discharge at this time.  ? ?Dayvon Dax, MSW, LCSW ?[facility]  ?Clinical Social Worker  ?[phone] ?09/26/2021  2:40 PM  ?

## 2021-09-26 NOTE — Progress Notes (Signed)
Post Partum Day 1 ?Subjective: ?up ad lib, voiding, tolerating PO, and having "pretty bad" uterine and low back pain ? ?Objective: ?Blood pressure 122/69, pulse 68, temperature 98.2 ?F (36.8 ?C), temperature source Oral, resp. rate 18, last menstrual period 12/20/2020, SpO2 100 %, unknown if currently breastfeeding. ? ?Physical Exam:  ?General: alert, cooperative, and no distress ?Lochia: appropriate ?Uterine Fundus: firm ?Incision: n/a ?DVT Evaluation: No evidence of DVT seen on physical exam. ? ?Recent Labs  ?  09/25/21 ?0325  ?HGB 11.8*  ?HCT 38.0  ? ? ?Assessment/Plan: ?Plan for discharge tomorrow and Breastfeeding ?Will offer Flexeril for back pain in addition to analgesics ? ? LOS: 1 day  ? ?Hansel Feinstein ?09/26/2021, 5:25 AM  ? ? ?

## 2021-09-29 ENCOUNTER — Encounter: Payer: Self-pay | Admitting: *Deleted

## 2021-10-06 ENCOUNTER — Telehealth (HOSPITAL_COMMUNITY): Payer: Self-pay | Admitting: *Deleted

## 2021-10-06 NOTE — Telephone Encounter (Signed)
Mom reports feeling good. No concerns about herself at this time. EPDS=2 [facility] score=8) ?Mom reports baby is doing well. Feeding, peeing, and pooping without difficulty. Safe sleep reviewed. Mom reports no concerns about baby at present. ? ?Duffy Rhody, RN 10-06-2021 at 10:00am ?

## 2021-10-31 ENCOUNTER — Ambulatory Visit: Payer: Medicaid Other | Admitting: Obstetrics and Gynecology

## 2021-11-14 ENCOUNTER — Other Ambulatory Visit (HOSPITAL_COMMUNITY)
Admission: RE | Admit: 2021-11-14 | Discharge: 2021-11-14 | Disposition: A | Discharge status: Home / Self Care | Payer: Medicaid Other | Source: Ambulatory Visit | Attending: Obstetrics and Gynecology | Admitting: Obstetrics and Gynecology

## 2021-11-14 ENCOUNTER — Ambulatory Visit (INDEPENDENT_AMBULATORY_CARE_PROVIDER_SITE_OTHER): Payer: Medicaid Other | Admitting: Obstetrics and Gynecology

## 2021-11-14 ENCOUNTER — Encounter: Payer: Self-pay | Admitting: Obstetrics and Gynecology

## 2021-11-14 VITALS — BP 119/71 | HR 81 | Ht 63.0 in | Wt 129.1 lb

## 2021-11-14 DIAGNOSIS — Z30430 Encounter for insertion of intrauterine contraceptive device: Secondary | ICD-10-CM | POA: Diagnosis not present

## 2021-11-14 DIAGNOSIS — N898 Other specified noninflammatory disorders of vagina: Secondary | ICD-10-CM | POA: Diagnosis not present

## 2021-11-14 DIAGNOSIS — Z3202 Encounter for pregnancy test, result negative: Secondary | ICD-10-CM

## 2021-11-14 DIAGNOSIS — Z3040 Encounter for surveillance of contraceptives, unspecified: Secondary | ICD-10-CM

## 2021-11-14 LAB — POCT PREGNANCY, URINE: Preg Test, Ur: NEGATIVE

## 2021-11-14 MED ORDER — LEVONORGESTREL 20.1 MCG/DAY IU IUD
1.0000 | INTRAUTERINE_SYSTEM | Freq: Once | INTRAUTERINE | Status: AC
  Administered 2021-11-14: 1 via INTRAUTERINE

## 2021-11-14 NOTE — Progress Notes (Signed)
Pt wants IUD 

## 2021-11-14 NOTE — Progress Notes (Deleted)
ERROR

## 2021-11-14 NOTE — Progress Notes (Signed)
    [facility] Partum Visit Note  Valerie Newman is a 21 y.o. [id number] s/p 4/13 SVD/intact perineum at 39/6 weeks after presenting in labor.  Anesthesia: epidural. Postpartum course has been uncomplicated. Baby is doing well. Baby is feeding by breast. Bleeding staining only. Bowel function is normal. Bladder function is normal. Patient is sexually active and no problem Contraception method is condoms and last intercourse 2 weeks ago. Postpartum depression screening: negative.    Edinburgh Postnatal Depression Scale - 11/14/21 1015       Edinburgh Postnatal Depression Scale:  In the Past 7 Days   I have been able to laugh and see the funny side of things. 0    I have looked forward with enjoyment to things. 0    I have blamed myself unnecessarily when things went wrong. 0    I have been anxious or worried for no good reason. 0    I have felt scared or panicky for no good reason. 0    Things have been getting on top of me. 0    I have been so unhappy that I have had difficulty sleeping. 0    I have felt sad or miserable. 0    I have been so unhappy that I have been crying. 0    The thought of harming myself has occurred to me. 0    Edinburgh Postnatal Depression Scale Total 0             Review of Systems Pertinent items noted in HPI and remainder of comprehensive ROS otherwise negative.  Objective:  BP 119/71   Pulse 81   Ht 5\' 3"  (1.6 m)   Wt 129 lb 1.6 oz (58.6 kg)   LMP 12/20/2020   Breastfeeding Yes   BMI 22.87 kg/m    NAD Abdomen: soft, nttp, nd EGBUS normal Vault: small amount of old blood in the vault; no active bleeding Cervix: wnl Bimanual: negative  See procedure note for Liletta IUD insertion  Labs: UPT negative Assessment:   Normal PP visit  Plan:  Patient had IUD in the past and desired another one and elected for Liletta which was placed today. Follow up AUB but d/w her re: breast feeding, recent birth and now with IUD placement. Follow up vaginal  swab  - Last pap smear  Diagnosis  Date Value Ref Range Status  05/06/2021   Final   - Negative for intraepithelial lesion or malignancy (NILM)    RTC 38m for string check.   Aletha Halim, MD [facility]

## 2021-11-17 ENCOUNTER — Encounter: Payer: Self-pay | Admitting: Obstetrics and Gynecology

## 2021-11-17 HISTORY — PX: IUD INSERTION: OBO1003

## 2021-11-17 LAB — CERVICOVAGINAL ANCILLARY ONLY
Bacterial Vaginitis (gardnerella): NEGATIVE
Candida Glabrata: NEGATIVE
Candida Vaginitis: POSITIVE — AB
Chlamydia: NEGATIVE
Comment: NEGATIVE
Comment: NEGATIVE
Comment: NEGATIVE
Comment: NEGATIVE
Comment: NEGATIVE
Comment: NORMAL
Neisseria Gonorrhea: NEGATIVE
Trichomonas: NEGATIVE

## 2021-11-17 NOTE — Procedures (Signed)
Intrauterine Device (IUD) Insertion Procedure Note  After a recent pap (results: wnl/date: 2023) and recent negative gonorrhea-chlamydia testing (date: 2023, repeat today) was confirmed, written consent was obtained; her urine pregnancy test was: negative The patient understands the risks of IUD placement, which include but are not limited to: bleeding, infection, uterine perforation, risk of expulsion, risk of failure < 1%, increased risk of ectopic pregnancy in the event of failure.   Prior to the procedure being performed, the patient (or guardian) was asked to state their full name, date of birth, and the type of procedure being performed. A bimanual exam showed the uterus to be midposition.  Next, the cervix and vagina were cleaned with an antiseptic solution, and the cervix was grasped with a tenaculum.  The uterus was sounded to  8.5  cm.  The Liletta was placed without difficulty in the usual fashion.  The strings were cut to 3-4 cm.  The tenaculum was removed and cervix was found to be hemostatic.    No complications, patient tolerated the procedure well.  Cornelia Copa MD Attending [facility])

## 2021-11-18 MED ORDER — FLUCONAZOLE 150 MG PO TABS: 150.0000 mg | ORAL_TABLET | Freq: Once | ORAL | 0 refills | Status: AC

## 2021-11-18 NOTE — Addendum Note (Signed)
Addended by: Aletha Halim on: 11/18/2021 06:52 PM   Modules accepted: Orders

## 2021-12-04 ENCOUNTER — Telehealth: Payer: Self-pay | Admitting: Family Medicine

## 2021-12-04 NOTE — Telephone Encounter (Signed)
Returned patients call. She reports that since she had the Liletta placed, she is bleeding daily and feeling nauseous and that her right breast is hurting so bad that she cannot lay baby on it.   She is feeling Nauseous every day when awakening and after eating or smelling certain foods. The Nausea comes and goes. She is not vomiting.   Patient reports she is bleeding and using tampons and changing every 2-3 hours and feels like the tampons are soaked and she is leaking out of them some. The bleeding started once IUD inserted. She had stopped post partum bleeding prior to getting IUD inserted.   She denies vaginal pain and reports she is not checking her strings and has nothing sticking out of the vagina.   Mom was breast feeding and reports infant will no longer latch. She stopped BF around May 20th. She weaned slowly and she is now no longer pumping or latching.   She has pain near her nipple/areola under the right breast. She does not wear under wire bras. The area is not reddened, no lump per mom. She has not had a fever or flu like symptoms. She sleeps on both sides.   Reviewed with Dr. Macon Large and she reports these are common with IUD and it takes time for body to adjust and can take time for body to adjust. Patient reports she has had an IUD previously and did not have the same issues.   Patient would like to see if she can get a sooner appointment. Reviewed I will ask the front desk but am not sure if it can be moved up.   Advised patient to call back in a week or so if not improving.

## 2021-12-04 NOTE — Telephone Encounter (Signed)
Patient states she is feeling very sick and her right nipple is hurting very bad after getting IUD on June 2. Would like a nurse to give her a call to see what she can do.

## 2021-12-12 ENCOUNTER — Ambulatory Visit (HOSPITAL_COMMUNITY)
Admission: EM | Admit: 2021-12-12 | Discharge: 2021-12-12 | Disposition: A | Discharge status: Home / Self Care | Payer: Medicaid Other | Attending: Student | Admitting: Student

## 2021-12-12 ENCOUNTER — Encounter (HOSPITAL_COMMUNITY): Payer: Self-pay | Admitting: Emergency Medicine

## 2021-12-12 DIAGNOSIS — Z975 Presence of (intrauterine) contraceptive device: Secondary | ICD-10-CM | POA: Insufficient documentation

## 2021-12-12 DIAGNOSIS — N921 Excessive and frequent menstruation with irregular cycle: Secondary | ICD-10-CM | POA: Diagnosis not present

## 2021-12-12 LAB — CBC WITH DIFFERENTIAL/PLATELET
Abs Immature Granulocytes: 0.01 10*3/uL (ref 0.00–0.07)
Basophils Absolute: 0 10*3/uL (ref 0.0–0.1)
Basophils Relative: 1 %
Eosinophils Absolute: 0.2 10*3/uL (ref 0.0–0.5)
Eosinophils Relative: 4 %
HCT: 34.3 % — ABNORMAL LOW (ref 36.0–46.0)
Hemoglobin: 11.9 g/dL — ABNORMAL LOW (ref 12.0–15.0)
Immature Granulocytes: 0 %
Lymphocytes Relative: 48 %
Lymphs Abs: 2.4 10*3/uL (ref 0.7–4.0)
MCH: 28.4 pg (ref 26.0–34.0)
MCHC: 34.7 g/dL (ref 30.0–36.0)
MCV: 81.9 fL (ref 80.0–100.0)
Monocytes Absolute: 0.5 10*3/uL (ref 0.1–1.0)
Monocytes Relative: 9 %
Neutro Abs: 1.9 10*3/uL (ref 1.7–7.7)
Neutrophils Relative %: 38 %
Platelets: 269 10*3/uL (ref 150–400)
RBC: 4.19 MIL/uL (ref 3.87–5.11)
RDW: 14.3 % (ref 11.5–15.5)
WBC: 5 10*3/uL (ref 4.0–10.5)
nRBC: 0 % (ref 0.0–0.2)

## 2021-12-12 LAB — POC URINE PREG, ED: Preg Test, Ur: NEGATIVE

## 2021-12-12 MED ORDER — MEGESTROL ACETATE 40 MG PO TABS: 40.0000 mg | ORAL_TABLET | Freq: Two times a day (BID) | ORAL | 0 refills | Status: DC

## 2021-12-12 NOTE — Discharge Instructions (Addendum)
-  Megestrol twice daily until follow-up appointment with gyn. Do not breastfeed while on this medication.  -We'll call with any abnormal labwork  -Follow-up with gyn as scheduled on 7/14.  -Do not remove your IUD yourself! This could be very dangerous, including perforation of the uterus.  -Follow-up if new symptoms -worsening abd pain, new dizziness, new fevers.

## 2021-12-12 NOTE — ED Provider Notes (Signed)
[facility]    CSN: [id number] Arrival date & time: 12/12/21  1849      History   Chief Complaint Chief Complaint  Patient presents with   Vaginal Bleeding   Abdominal Cramping    HPI Valerie Newman is a 21 y.o. female presenting with vaginal bleeding and crampy lower abd pain x2 months following IUD insertion (Liletta).  She is not able to quantify number of tampons and pads use daily, but states she is changing these frequently and the bleeding is heavy.  She has a history of anemia but denies any current dizziness, shortness of breath, weakness.  The patient states that she has contacted her GYN about this, they could not fit her in until 7/14.  She has grown so frustrated with the vaginal bleeding that she is threatening to remove her IUD herself.  She denies STI risk, but is interested in STI screening.  She denies vaginal discharge, vaginal odor, vaginal rash or lesion.  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 Authorizing Provider  megestrol (MEGACE) 40 MG tablet Take 1 tablet (40 mg total) by mouth 2 (two) times daily for 14 days. 12/12/21 12/26/21 Yes Rhys Martini, PA-C  ibuprofen (ADVIL) 600 MG tablet Take 1 tablet (600 mg total) by mouth every 6 (six) hours. 09/26/21   Rolm Bookbinder, CNM  levonorgestrel (LILETTA, 52 MG,) 20.1 MCG/DAY IUD 1 each by Intrauterine route  once. 11/14/21   [provider]    Family History Family History  Problem Relation Age of Onset   Heart disease Mother    Stroke Mother    Hypertension Maternal Grandmother    Diabetes Maternal Grandmother    Diabetes Maternal Grandfather    Diabetes Maternal Uncle    Diabetes Maternal Aunt     Social History Social History   Tobacco Use   Smoking status: Never    Passive exposure: Never   Smokeless tobacco: Never  Vaping Use   Vaping Use: Never used  Substance Use Topics   Alcohol use: No   Drug use: No     Allergies   Bee venom, Metronidazole, and Wasp venom   Review of Systems Review of Systems  Constitutional:  Negative for chills and fever.  HENT:  Negative for sore throat.   Eyes:  Negative for pain and redness.  Respiratory:  Negative for shortness of breath.   Cardiovascular:  Negative for chest pain.  Gastrointestinal:  Negative for abdominal pain, diarrhea, nausea and vomiting.  Genitourinary:  Positive for menstrual problem. Negative for decreased urine volume, difficulty urinating, dysuria, flank pain, frequency, genital sores, hematuria and urgency.  Musculoskeletal:  Negative for back pain.  Skin:  Negative for rash.  All other systems reviewed and are negative.    Physical Exam Triage Vital Signs ED Triage Vitals  Enc Vitals Group     BP 12/12/21 1923 112/71     Pulse Rate 12/12/21 1922 (!) 104     Resp 12/12/21 1922 18     Temp 12/12/21 1922 98.1 F (36.7 C)     Temp src --      SpO2 12/12/21 1922 98 %     Weight --      Height --      Head Circumference --      Peak Flow --      Pain Score 12/12/21 1921 3     Pain Loc --      Pain Edu? --      Excl. in GC? --    No data found.  Updated Vital Signs BP 112/71   Pulse (!) 104   Temp 98.1 F (36.7 C)   Resp 18   SpO2 98%   Breastfeeding Yes   Visual Acuity Right Eye Distance:   Left Eye Distance:   Bilateral Distance:    Right Eye Near:   Left Eye Near:     Bilateral Near:     Physical Exam Vitals reviewed.  Constitutional:      General: She is not in acute distress.    Appearance: Normal appearance. She is not ill-appearing.  HENT:     Head: Normocephalic and atraumatic.     Mouth/Throat:     Mouth: Mucous membranes are moist.     Comments: Moist mucous membranes Eyes:     Extraocular Movements: Extraocular movements intact.     Pupils: Pupils are equal, round, and reactive to light.  Cardiovascular:     Rate and Rhythm: Normal rate and regular rhythm.     Heart sounds: Normal heart sounds.  Pulmonary:     Effort: Pulmonary effort is normal.     Breath sounds: Normal breath sounds. No wheezing, rhonchi or rales.  Abdominal:     General: Bowel sounds are normal. There is no distension.     Palpations: Abdomen is soft. There is no mass.     Tenderness: There is no abdominal tenderness. There is no right CVA tenderness, left CVA tenderness, guarding or rebound. Negative signs include Murphy's sign, Rovsing's sign and McBurney's sign.     Comments: Minimal lower abd pain. Comfortable throughout exam  Skin:    General: Skin is warm.     Capillary Refill: Capillary refill takes less than 2 seconds.     Comments: Good skin turgor  Neurological:     General: No focal deficit present.     Mental Status: She is alert and oriented to person, place, and time.  Psychiatric:        Mood and Affect: Mood normal.        Behavior: Behavior normal.      UC Treatments / Results  Labs (all labs ordered are listed, but only abnormal results are displayed) Labs Reviewed  CBC WITH DIFFERENTIAL/PLATELET  POC URINE PREG, ED  CERVICOVAGINAL ANCILLARY ONLY    EKG   Radiology No results found.  Procedures Procedures (including critical care time)  Medications Ordered in UC Medications - No data to display  Initial Impression /  Assessment and Plan / UC Course  I have reviewed the triage vital signs and the nursing notes.  Pertinent  labs & imaging results that were available during my care of the patient were reviewed by me and considered in my medical decision making (see chart for details).     This patient is a very pleasant 21 y.o. year old female presenting with heavy bleeding on hormonal IUD since insertion x2 months. Minimal abd pain and no flank pain. Afebrile but borderline tachy.    U-preg negative. Will send self-swab for G/C, trich, yeast, BV testing. Declines HIV, RPR. Safe sex precautions.  History anemia. Will check a CBC.   I strongly advised against removing IUD at home. Next gyn appt is 7/14. We will start her on megace while awaiting this visit. She is in agreement. She states multiple times she is not breastfeeding, and she understands to avoid breastfeeding while on this medication.   ED return precautions discussed. Patient verbalizes understanding and agreement.    Final Clinical Impressions(s) / UC Diagnoses   Final diagnoses:  Breakthrough bleeding associated with intrauterine device (IUD)     Discharge Instructions      -Megestrol twice daily until follow-up appointment with gyn. Do not breastfeed while on this medication.  -We'll call with any abnormal labwork  -Follow-up with gyn as scheduled on 7/14.  -Do not remove your IUD yourself! This could be very dangerous, including perforation of the uterus.  -Follow-up if new symptoms -worsening abd pain, new dizziness, new fevers.    ED Prescriptions     Medication Sig Dispense Auth. Provider   megestrol (MEGACE) 40 MG tablet Take 1 tablet (40 mg total) by mouth 2 (two) times daily for 14 days. 28 tablet Rhys Martini, PA-C      PDMP not reviewed this encounter.   Rhys Martini, PA-C 12/12/21 2006

## 2021-12-12 NOTE — ED Triage Notes (Signed)
Pt is present today with c/o vaginal bleeding and abdominal pain. Pt states that she has been bleeding for two months and has the IUD for birth control

## 2021-12-15 ENCOUNTER — Telehealth (HOSPITAL_COMMUNITY): Payer: Self-pay | Admitting: Emergency Medicine

## 2021-12-15 LAB — CERVICOVAGINAL ANCILLARY ONLY
Bacterial Vaginitis (gardnerella): POSITIVE — AB
Candida Glabrata: NEGATIVE
Candida Vaginitis: POSITIVE — AB
Chlamydia: NEGATIVE
Comment: NEGATIVE
Comment: NEGATIVE
Comment: NEGATIVE
Comment: NEGATIVE
Comment: NEGATIVE
Comment: NORMAL
Neisseria Gonorrhea: NEGATIVE
Trichomonas: NEGATIVE

## 2021-12-15 MED ORDER — METRONIDAZOLE 0.75 % VA GEL: 1.0000 | Freq: Every day | VAGINAL | 0 refills | Status: AC

## 2021-12-15 MED ORDER — FLUCONAZOLE 150 MG PO TABS: 150.0000 mg | ORAL_TABLET | Freq: Once | ORAL | 0 refills | Status: AC

## 2021-12-26 ENCOUNTER — Ambulatory Visit (INDEPENDENT_AMBULATORY_CARE_PROVIDER_SITE_OTHER): Payer: Medicaid Other | Admitting: Obstetrics and Gynecology

## 2021-12-26 VITALS — BP 112/72 | HR 70 | Wt 126.2 lb

## 2021-12-26 DIAGNOSIS — Z30011 Encounter for initial prescription of contraceptive pills: Secondary | ICD-10-CM

## 2021-12-26 DIAGNOSIS — Z975 Presence of (intrauterine) contraceptive device: Secondary | ICD-10-CM | POA: Diagnosis not present

## 2021-12-26 DIAGNOSIS — N921 Excessive and frequent menstruation with irregular cycle: Secondary | ICD-10-CM | POA: Diagnosis not present

## 2021-12-26 DIAGNOSIS — Z30432 Encounter for removal of intrauterine contraceptive device: Secondary | ICD-10-CM | POA: Diagnosis not present

## 2021-12-26 DIAGNOSIS — Z3009 Encounter for other general counseling and advice on contraception: Secondary | ICD-10-CM

## 2021-12-26 LAB — POCT PREGNANCY, URINE: Preg Test, Ur: NEGATIVE

## 2021-12-26 MED ORDER — NORETHIN ACE-ETH ESTRAD-FE 1-20 MG-MCG(24) PO TABS: 1.0000 | ORAL_TABLET | Freq: Every day | ORAL | 3 refills | Status: DC

## 2021-12-26 NOTE — Progress Notes (Signed)
  Obstetrics and Gynecology Visit Return Patient Evaluation  Appointment Date: 12/26/2021  Primary Care Provider: Pcp, No  [facility]: [facility]  Chief Complaint: AUB with Liletta  History of Present Illness:  Valerie Newman is a 21 y.o. P2 s/p 11/14/21 Liletta IUD placement at her PP visit  Interval History: Since that time, she states that she's had continued qday AUB; she's had issues with an IUD in the past. She's used depo, nexplanon and OCPs in the past, as well  Patient went to UC on 6/30 and had neg UPT and dx with bv and yeast and meds sent in; no pelvic exam done.   Review of Systems:  as noted in the History of Present Illness.   Patient Active Problem List   Diagnosis Date Noted   Breakthrough bleeding with IUD 12/26/2021   Infusion reaction 07/15/2021   Medications: Liletta   Allergies: is allergic to bee venom, metronidazole, and wasp venom.  Physical Exam:  BP 112/72   Pulse 70   Wt 126 lb 3.2 oz (57.2 kg)   Breastfeeding Yes   BMI 22.36 kg/m  Body mass index is 22.36 kg/m. General appearance: Well nourished, well developed female in no acute distress.  Abdomen: diffusely non tender to palpation, non distended, and no masses, hernias Neuro/Psych:  Normal mood and affect.    Pelvic exam:  EGBUS: normal Vaginal vault: no blood in vault. Scant yellow/brown d/c in the vault Cervix:  IUD strings seen 3-4cm>>removed w/o issued Bimanual: deferred  Labs: UPT neg   Assessment: pt stable  Plan:  1. Breakthrough bleeding with IUD Options d/w her with keeping the IUD but she would like to have it removed and start OCP today; loestrin sent   RTC: 58m for annual   Cornelia Copa MD Attending [facility])

## 2021-12-27 ENCOUNTER — Encounter: Payer: Self-pay | Admitting: Obstetrics and Gynecology

## 2021-12-27 HISTORY — PX: IUD REMOVAL: OBO 1004

## 2021-12-27 NOTE — Procedures (Signed)
Intrauterine Device (IUD) Removal Procedure Note  Continued breakthrough bleeding  Prior to the procedure being performed, the patient (or guardian) was asked to state their full name, date of birth, and the type of procedure being performed. EGBUS normal. Vaginal vault normal. Cervix normal with IUD strings seen (approx 3-4cm in length). Strings grasped with ringed forceps and easily removed and noted to be intact.   No complications, patient tolerated the procedure well.  Cornelia Copa MD Attending [facility] (Faculty Practice) 12/26/2021

## 2022-01-14 ENCOUNTER — Ambulatory Visit (HOSPITAL_COMMUNITY)
Admission: EM | Admit: 2022-01-14 | Discharge: 2022-01-14 | Disposition: A | Discharge status: Home / Self Care | Payer: Medicaid Other | Attending: Physician Assistant | Admitting: Physician Assistant

## 2022-01-14 ENCOUNTER — Encounter (HOSPITAL_COMMUNITY): Payer: Self-pay

## 2022-01-14 DIAGNOSIS — N898 Other specified noninflammatory disorders of vagina: Secondary | ICD-10-CM | POA: Diagnosis not present

## 2022-01-14 MED ORDER — METRONIDAZOLE 500 MG PO TABS: 500.0000 mg | ORAL_TABLET | Freq: Two times a day (BID) | ORAL | 0 refills | Status: DC

## 2022-01-14 MED ORDER — CLINDAMYCIN HCL 300 MG PO CAPS: 300.0000 mg | ORAL_CAPSULE | Freq: Three times a day (TID) | ORAL | 0 refills | Status: DC

## 2022-01-14 NOTE — ED Provider Notes (Signed)
[facility]    CSN: [id number] Arrival date & time: 01/14/22  0846      History   Chief Complaint Chief Complaint  Patient presents with   Vaginal Discharge    HPI Valerie Newman is a 21 y.o. female.   Patient presents today with a several week history of vaginal irritation and odor.  She was seen by clinic on 12/12/2021 at which point she tested positive for yeast and bacterial vaginosis.  She was started on Diflucan and MetroGel.  She does report that she continues to have some symptoms including discharge and odor.  Reports that when she bears down to have a bowel movement she will notice significant clear discharge.  She denies any changes to personal hygiene products including soaps or detergents.  She is sexually active with female partners does not consistently use condoms.  She is confident that she is not pregnant.  She is not breast-feeding.  Denies any recent antibiotic use outside of what was prescribed.  Denies any pelvic pain, abdominal pain, fever, nausea, vomiting.    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 Authorizing Provider  clindamycin (CLEOCIN) 300 MG capsule Take 1 capsule (300 mg total) by mouth 3 (three) times daily. 01/14/22  Yes Alphonsus Doyel K, PA-C  ibuprofen (ADVIL) 600 MG tablet Take 1 tablet  (600 mg total) by mouth every 6 (six) hours. 09/26/21   Rolm Bookbinder, CNM  Norethindrone Acetate-Ethinyl Estrad-FE (LOESTRIN 24 FE) 1-20 MG-MCG(24) tablet Take 1 tablet by mouth daily. 12/26/21   [place] Bing, MD    Family History Family History  Problem Relation Age of Onset   Heart disease Mother    Stroke Mother    Hypertension Maternal Grandmother    Diabetes Maternal Grandmother    Diabetes Maternal Grandfather    Diabetes Maternal Uncle    Diabetes Maternal Aunt     Social History Social History   Tobacco Use   Smoking status: Never    Passive exposure: Never   Smokeless tobacco: Never  Vaping Use   Vaping Use: Never used  Substance Use Topics   Alcohol use: No   Drug use: No     Allergies   Bee venom, Metronidazole, and Wasp venom   Review of Systems Review of Systems  Constitutional:  Positive for activity change. Negative for appetite change, fatigue and fever.  Gastrointestinal:  Negative for abdominal pain, diarrhea, nausea and vomiting.  Genitourinary:  Positive for vaginal discharge. Negative for dysuria, frequency, pelvic pain, urgency, vaginal bleeding and vaginal pain.  Musculoskeletal:  Negative for arthralgias, back pain and myalgias.     Physical Exam Triage Vital Signs ED Triage Vitals [01/14/22 0923]  Enc Vitals Group     BP 117/65     Pulse Rate 82     Resp 18     Temp (!) 97.5 F (36.4 C)     Temp Source Oral     SpO2 100 %     Weight      Height      Head Circumference      Peak Flow      Pain Score      Pain Loc      Pain Edu?      Excl. in GC?    No data found.  Updated Vital Signs BP 117/65 (BP Location: Left Arm)   Pulse 82   Temp (!) 97.5 F (36.4 C) (Oral)   Resp 18   SpO2 100%   Visual Acuity Right Eye Distance:   Left Eye Distance:   Bilateral Distance:    Right Eye Near:   Left Eye Near:    Bilateral Near:     Physical Exam Vitals reviewed.  Constitutional:      General: She is awake. She is  not in acute distress.    Appearance: Normal appearance. She is well-developed. She is not ill-appearing.     Comments: Very pleasant female appears stated age in no acute distress sitting comfortably in exam room  HENT:     Head: Normocephalic and atraumatic.  Cardiovascular:     Rate and Rhythm: Normal rate and regular rhythm.     Heart sounds: Normal heart sounds, S1 normal and S2 normal. No murmur heard. Pulmonary:     Effort: Pulmonary effort is normal.     Breath sounds: Normal breath sounds. No wheezing, rhonchi or rales.     Comments: Clear to auscultation bilaterally Abdominal:     General: Bowel sounds are normal.     Palpations: Abdomen is soft.     Tenderness: There is no abdominal tenderness. There is no right CVA tenderness, left CVA tenderness, guarding or rebound.     Comments: Benign abdominal exam  Genitourinary:    Comments: Exam deferred Psychiatric:        Behavior: Behavior is cooperative.      UC Treatments / Results  Labs (all labs ordered are listed, but only abnormal results are displayed) Labs Reviewed  CERVICOVAGINAL ANCILLARY ONLY    EKG   Radiology No results found.  Procedures Procedures (including critical care time)  Medications Ordered in UC Medications - No data to display  Initial Impression / Assessment and Plan / UC Course  I have reviewed the triage vital signs and the nursing notes.  Pertinent labs & imaging results that were available during my care of the patient were reviewed by me and considered in my medical decision making (see chart for details).     STI swab collected today-results pending.  Discussed that discharge symptoms could be related to use of MetroGel but today since she is still having an odor as well as increased discharge we will empirically treat for bacterial vaginosis.  Patient has difficulty tolerating oral metronidazole so we will use clindamycin.  Recommended hypoallergenic soaps and detergents and  wearing loosefitting cotton underwear.  We will contact her if we need to arrange any additional treatment  based on her swab results.  Patient reports that she is not pregnant and is not actively breast-feeding.  Discussed that if she has any worsening symptoms including abdominal pain, fever, nausea, vomiting, pelvic pain she needs to be seen immediately to which she expressed understanding.  Strict return precautions given.  Final Clinical Impressions(s) / UC Diagnoses   Final diagnoses:  Vaginal discharge  Vaginal odor     Discharge Instructions      We are going to treat you with clindamycin to cover for bacterial vaginosis.  Take this 3 times a day for 1 week.  Wear loosefitting cotton underwear and use hypoallergenic soaps and detergents.  We will contact you if we need to make any additional treatment.  If you develop any additional symptoms including fever, abdominal pain, pelvic pain, nausea, vomiting you need to be seen immediately.     ED Prescriptions     Medication Sig Dispense Auth. Provider   metroNIDAZOLE (FLAGYL) 500 MG tablet  (Status: Discontinued) Take 1 tablet (500 mg total) by mouth 2 (two) times daily. 14 tablet Hinton Luellen K, PA-C   clindamycin (CLEOCIN) 300 MG capsule Take 1 capsule (300 mg total) by mouth 3 (three) times daily. 21 capsule Carlee Tesfaye K, PA-C      PDMP not reviewed this encounter.   Jeani Hawking, PA-C 01/14/22 2458

## 2022-01-14 NOTE — ED Triage Notes (Signed)
Pt reports vaginal discharge for several days. Pt reports she was on medication for BV, but she was still having sex doing her treatment.

## 2022-01-14 NOTE — Discharge Instructions (Addendum)
We are going to treat you with clindamycin to cover for bacterial vaginosis.  Take this 3 times a day for 1 week.  Wear loosefitting cotton underwear and use hypoallergenic soaps and detergents.  We will contact you if we need to make any additional treatment.  If you develop any additional symptoms including fever, abdominal pain, pelvic pain, nausea, vomiting you need to be seen immediately.

## 2022-01-15 LAB — CERVICOVAGINAL ANCILLARY ONLY
Bacterial Vaginitis (gardnerella): NEGATIVE
Candida Glabrata: NEGATIVE
Candida Vaginitis: NEGATIVE
Chlamydia: NEGATIVE
Comment: NEGATIVE
Comment: NEGATIVE
Comment: NEGATIVE
Comment: NEGATIVE
Comment: NEGATIVE
Comment: NORMAL
Neisseria Gonorrhea: NEGATIVE
Trichomonas: NEGATIVE

## 2022-01-25 ENCOUNTER — Ambulatory Visit (HOSPITAL_COMMUNITY)
Admission: EM | Admit: 2022-01-25 | Discharge: 2022-01-25 | Disposition: A | Discharge status: Home / Self Care | Payer: Medicaid Other | Attending: Emergency Medicine | Admitting: Emergency Medicine

## 2022-01-25 ENCOUNTER — Encounter (HOSPITAL_COMMUNITY): Payer: Self-pay

## 2022-01-25 DIAGNOSIS — Z20822 Contact with and (suspected) exposure to covid-19: Secondary | ICD-10-CM | POA: Insufficient documentation

## 2022-01-25 DIAGNOSIS — Z113 Encounter for screening for infections with a predominantly sexual mode of transmission: Secondary | ICD-10-CM

## 2022-01-25 DIAGNOSIS — Z202 Contact with and (suspected) exposure to infections with a predominantly sexual mode of transmission: Secondary | ICD-10-CM | POA: Diagnosis not present

## 2022-01-25 DIAGNOSIS — N898 Other specified noninflammatory disorders of vagina: Secondary | ICD-10-CM | POA: Diagnosis not present

## 2022-01-25 DIAGNOSIS — K120 Recurrent oral aphthae: Secondary | ICD-10-CM

## 2022-01-25 DIAGNOSIS — J029 Acute pharyngitis, unspecified: Secondary | ICD-10-CM

## 2022-01-25 LAB — HIV ANTIBODY (ROUTINE TESTING W REFLEX): HIV Screen 4th Generation wRfx: NONREACTIVE

## 2022-01-25 LAB — POCT RAPID STREP A, ED / UC: Streptococcus, Group A Screen (Direct): NEGATIVE

## 2022-01-25 LAB — SARS CORONAVIRUS 2 BY RT PCR: SARS Coronavirus 2 by RT PCR: NEGATIVE

## 2022-01-25 MED ORDER — TRIAMCINOLONE ACETONIDE 0.1 % MT PSTE
1.0000 | PASTE | Freq: Two times a day (BID) | OROMUCOSAL | 12 refills | Status: DC
Start: 2022-01-25 — End: 2022-04-04

## 2022-01-25 NOTE — ED Triage Notes (Signed)
Pt would like STD testing today.  Pt reports sore in her mouth x 2 days. Pt reports a sore throat.

## 2022-01-25 NOTE — ED Provider Notes (Addendum)
[facility]    CSN: [id number] Arrival date & time: 01/25/22  1001      History   Chief Complaint Chief Complaint  Patient presents with   Sore Throat   Exposure to STD   Mouth Lesions    HPI Valerie Newman is a 21 y.o. female.   Patient presents with several different chief complaints today.  Patient would like STD testing completing including blood work for HIV and syphilis.  Patient reports that she started having some yellow to clear mucousy vaginal discharge yesterday.  Denies any confirmed exposure to STD.  She did use leftover metronidazole gel with minimal improvement.  Denies any urinary symptoms, abdominal pain, back pain, fever, pelvic pain.  Last menstrual cycle was just completed a few days ago per patient.  Denies any abnormal vaginal bleeding.  Patient also reports a sore in the left side of her mouth that started about 2 days ago.  Patient also reporting that she has a sore throat that has been intermittent over the past week.  Her daughter recently tested positive for strep throat.  Denies any associated upper respiratory symptoms, cough, fever, chest pain, shortness of breath, nausea, vomiting, diarrhea, abdominal pain.   Patient is not breastfeeding.    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 Authorizing Provider  triamcinolone (KENALOG) 0.1 % paste Use as directed 1 Application in the mouth or throat 2 (two) times daily. Apply to sore in mouth 01/25/22  Yes Daegon Deiss, [place] E, [place]  clindamycin (CLEOCIN) 300 MG capsule Take 1 capsule (300 mg total) by mouth 3 (three) times daily. 01/14/22   Raspet, Noberto Retort, PA-C  ibuprofen (ADVIL) 600 MG tablet Take 1 tablet (600 mg total) by mouth every 6 (six) hours. 09/26/21   Rolm Bookbinder, CNM  Norethindrone Acetate-Ethinyl Estrad-FE (LOESTRIN 24 FE) 1-20 MG-MCG(24) tablet Take 1 tablet by mouth daily. 12/26/21   [place] Bing, MD    Family History Family History  Problem Relation Age of Onset   Heart disease Mother    Stroke Mother    Hypertension Maternal Grandmother    Diabetes Maternal Grandmother    Diabetes Maternal Grandfather    Diabetes Maternal Uncle    Diabetes Maternal Aunt     Social History Social History   Tobacco Use   Smoking status: Never    Passive exposure: Never  Smokeless tobacco: Never  Vaping Use   Vaping Use: Never used  Substance Use Topics   Alcohol use: No   Drug use: No     Allergies   Bee venom, Metronidazole, and Wasp venom   Review of Systems Review of Systems Per HPI  Physical Exam Triage Vital Signs ED Triage Vitals [01/25/22 1014]  Enc Vitals Group     BP 110/74     Pulse Rate (!) 115     Resp 16     Temp 98.8 F (37.1 C)     Temp Source Oral     SpO2 95 %     Weight      Height      Head Circumference      Peak Flow      Pain Score      Pain Loc      Pain Edu?      Excl. in GC?    No data found.  Updated Vital Signs BP 110/74 (BP Location: Left Arm)   Pulse (!) 115   Temp 98.8 F (37.1 C) (Oral)   Resp 16   SpO2 95%   Visual Acuity Right Eye Distance:   Left Eye Distance:   Bilateral Distance:    Right Eye Near:   Left Eye Near:    Bilateral Near:     Physical Exam Constitutional:      General:  She is not in acute distress.    Appearance: Normal appearance. She is not toxic-appearing or diaphoretic.  HENT:     Head: Normocephalic and atraumatic.     Right Ear: Tympanic membrane and ear canal normal.     Left Ear: Tympanic membrane and ear canal normal.     Nose: No congestion.     Mouth/Throat:     Mouth: Mucous membranes are moist.     Pharynx: No pharyngeal swelling, oropharyngeal exudate, posterior oropharyngeal erythema or uvula swelling.     Tonsils: No tonsillar exudate or tonsillar abscesses.      Comments: Patient has aphthous ulcer in between left cheek and left lower dentition. No signs of bacterial infection.  Eyes:     Extraocular Movements: Extraocular movements intact.     Conjunctiva/sclera: Conjunctivae normal.     Pupils: Pupils are equal, round, and reactive to light.  Cardiovascular:     Rate and Rhythm: Normal rate and regular rhythm.     Pulses: Normal pulses.     Heart sounds: Normal heart sounds.  Pulmonary:     Effort: Pulmonary effort is normal. No respiratory distress.     Breath sounds: Normal breath sounds. No wheezing.  Abdominal:     General: Abdomen is flat. Bowel sounds are normal.     Palpations: Abdomen is soft.  Musculoskeletal:        General: Normal range of motion.     Cervical back: Normal range of motion.  Skin:    General: Skin is warm and dry.  Neurological:     General: No focal deficit present.     Mental Status: She is alert and oriented to person, place, and time. Mental status is at baseline.  Psychiatric:        Mood and Affect: Mood normal.        Behavior: Behavior normal.      UC Treatments / Results  Labs (all labs ordered are listed, but only abnormal results are displayed) Labs Reviewed  CULTURE, GROUP A STREP [facility])  SARS CORONAVIRUS 2  BY RT PCR  RPR  HIV ANTIBODY (ROUTINE TESTING W REFLEX)  POCT RAPID STREP A, ED / UC  CERVICOVAGINAL ANCILLARY ONLY    EKG   Radiology No results  found.  Procedures Procedures (including critical care time)  Medications Ordered in UC Medications - No data to display  Initial Impression / Assessment and Plan / UC Course  I have reviewed the triage vital signs and the nursing notes.  Pertinent labs & imaging results that were available during my care of the patient were reviewed by me and considered in my medical decision making (see chart for details).     Rapid strep was negative.  Throat culture pending.  Posterior pharynx and physical exam is fairly benign.  Low suspicion for strep throat despite patient's close exposure.  Discussed with patient the possibility that viral illness could be causing sore throat.  COVID test pending.  Discussed supportive care and symptom management for sore throat.  Patient has aphthous ulcer.  Will treat with Or[place].  No signs of bacterial infection in mouth on exam.  Cervicovaginal swab pending for new onset vaginal discharge.  HIV and RPR also pending.  Will await results for treatment.  Will defer pregnancy test given the patient recently came off her menstrual cycle.  Patient to refrain from sexual activity until test results and treatment are complete.  Patient verbalized understanding and was agreeable with plan.  Recheck of HR was 98.   Final Clinical Impressions(s) / UC Diagnoses   Final diagnoses:  Sore throat  Vaginal discharge  Screening examination for venereal disease  Aphthous ulcer     Discharge Instructions      You have an ulcer in your mouth which is being treated with a paste that you apply directly to it.  Your rapid strep was negative.  Throat culture and COVID test are pending.  Suspect that your sore throat is due to a viral illness and should resolve with symptomatic treatment as we discussed.  Your vaginal swab is pending as well as your blood work.  We will call if it is abnormal and treat as appropriate.  Please refrain from sexual activity until test results and  treatment are complete.    ED Prescriptions     Medication Sig Dispense Auth. Provider   triamcinolone (KENALOG) 0.1 % paste Use as directed 1 Application in the mouth or throat 2 (two) times daily. Apply to sore in mouth 5 g Teodora Medici, [place]      PDMP not reviewed this encounter.   Teodora Medici, [place] 01/25/22 [place], [place],  01/25/22 1043

## 2022-01-25 NOTE — Discharge Instructions (Signed)
You have an ulcer in your mouth which is being treated with a paste that you apply directly to it.  Your rapid strep was negative.  Throat culture and COVID test are pending.  Suspect that your sore throat is due to a viral illness and should resolve with symptomatic treatment as we discussed.  Your vaginal swab is pending as well as your blood work.  We will call if it is abnormal and treat as appropriate.  Please refrain from sexual activity until test results and treatment are complete.

## 2022-01-26 LAB — CERVICOVAGINAL ANCILLARY ONLY
Bacterial Vaginitis (gardnerella): NEGATIVE
Candida Glabrata: NEGATIVE
Candida Vaginitis: POSITIVE — AB
Chlamydia: NEGATIVE
Comment: NEGATIVE
Comment: NEGATIVE
Comment: NEGATIVE
Comment: NEGATIVE
Comment: NEGATIVE
Comment: NORMAL
Neisseria Gonorrhea: NEGATIVE
Trichomonas: NEGATIVE

## 2022-01-26 LAB — RPR: RPR Ser Ql: NONREACTIVE

## 2022-01-27 ENCOUNTER — Telehealth (HOSPITAL_COMMUNITY): Payer: Self-pay | Admitting: Emergency Medicine

## 2022-01-27 LAB — CULTURE, GROUP A STREP (THRC)

## 2022-01-27 MED ORDER — FLUCONAZOLE 150 MG PO TABS: 150.0000 mg | ORAL_TABLET | Freq: Once | ORAL | 0 refills | Status: AC

## 2022-02-07 LAB — GLUCOSE, POCT (MANUAL RESULT ENTRY): POC Glucose: 118 mg/dl — AB (ref 70–99)

## 2022-04-03 ENCOUNTER — Ambulatory Visit
Admission: EM | Admit: 2022-04-03 | Discharge: 2022-04-03 | Disposition: A | Discharge status: Other Acute Inpatient Hospital | Payer: Medicaid Other

## 2022-04-04 ENCOUNTER — Ambulatory Visit
Admission: RE | Admit: 2022-04-04 | Discharge: 2022-04-04 | Disposition: A | Discharge status: Home / Self Care | Payer: Medicaid Other | Source: Ambulatory Visit | Attending: Emergency Medicine | Admitting: Emergency Medicine

## 2022-04-04 VITALS — BP 110/74 | HR 63 | Temp 98.0°F | Resp 16

## 2022-04-04 DIAGNOSIS — B3731 Acute candidiasis of vulva and vagina: Secondary | ICD-10-CM | POA: Diagnosis present

## 2022-04-04 DIAGNOSIS — N3090 Cystitis, unspecified without hematuria: Secondary | ICD-10-CM | POA: Insufficient documentation

## 2022-04-04 DIAGNOSIS — Z113 Encounter for screening for infections with a predominantly sexual mode of transmission: Secondary | ICD-10-CM | POA: Diagnosis present

## 2022-04-04 LAB — POCT URINALYSIS DIP (MANUAL ENTRY)
Glucose, UA: 100 mg/dL — AB
Nitrite, UA: POSITIVE — AB
Protein Ur, POC: >=300 mg/dL — AB
Spec Grav, UA: 1.015 (ref 1.010–1.025)
Urobilinogen, UA: 4 E.U./dL — AB
pH, UA: 8.5 — AB (ref 5.0–8.0)

## 2022-04-04 LAB — POCT URINE PREGNANCY: Preg Test, Ur: NEGATIVE

## 2022-04-04 MED ORDER — SULFAMETHOXAZOLE-TRIMETHOPRIM 800-160 MG PO TABS: 1.0000 | ORAL_TABLET | Freq: Two times a day (BID) | ORAL | 0 refills | Status: AC

## 2022-04-04 MED ORDER — FLUCONAZOLE 150 MG PO TABS: ORAL_TABLET | ORAL | 0 refills | Status: DC

## 2022-04-04 NOTE — ED Triage Notes (Signed)
Pt c/o white vaginal discharge that started a couple day ago per patient.  Home interventions: none

## 2022-04-04 NOTE — Discharge Instructions (Addendum)
Based on the symptoms and concerns you shared with me today, you were treated for presumed vaginal yeast infection with fluconazole (Diflucan), take the first tablet today and take the second tablet three days after the first tablet.  I recommend that you abstain from sexual intercourse, tampon use or any other other intravaginal activities while being treated.   Your urine pregnancy test today is negative.  The results of your vaginal swab test which tests for BV, yeast, gonorrhea, chlamydia and trichomonas will be posted to your MyChart account in the next 3 to 5 days.  If any of your results are abnormal, you will receive a phone call regarding further treatment.  Additional prescriptions, if any are needed, will be provided for you at your pharmacy.   Please abstain from sexual intercourse of any kind, vaginal, oral or anal, until until you have received the results of your test.   The urinalysis that we performed in the clinic today was abnormal.  Urine culture will be performed per our protocol.  The result of the urine culture will be available in the next 3 to 5 days and will be posted to your MyChart account.  If there is an abnormal finding, you will be contacted by phone and advised of further treatment recommendations, if any.   You were advised to begin antibiotics today because your urinalysis is concerning for an acute lower urinary tract infection also known as cystitis.     Please pick up and begin taking your prescription for Bactrim DS (trimethoprim sulfamethoxazole) as soon as possible.  Please take all doses exactly as prescribed.  You can take this medication with or without food.  This medication is safe to take with your other medications.   If you have not had complete resolution of your symptoms after completing any needed treatment, please return for repeat evaluation.   Thank you for visiting urgent care today.  I appreciate the opportunity to participate in your care.

## 2022-04-04 NOTE — ED Provider Notes (Signed)
[facility]    CSN: [id number] Arrival date & time: 04/04/22  [id number]    HISTORY   Chief Complaint  Patient presents with   Vaginal Discharge   HPI Valerie Newman is a pleasant, 21 y.o. female who presents to urgent care today. Pt c/o white vaginal discharge that started a couple day ago per patient.. Patient endorses white vaginal discharge.  Patient denies abnormal odor of urine, burning with urination, blood in urine, increased frequency of urination, increased urge to urinate, suprapubic pain, perineal pain, flank pain, fever, chills, malaise, rigors, significant fatigue, vaginal itching, vaginal irritation, dyspareunia, genital lesion(s), known exposure to STD, and possible exposure to STD.  Tried a medication to alleviate her symptoms.  Has not been taking any supplements.   The history is provided by the patient.   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 Authorizing Provider  Norethindrone Acetate-Ethinyl Estrad-FE (LOESTRIN 24 FE) 1-20 MG-MCG(24) tablet Take 1 tablet by mouth daily. 12/26/21   Aletha Halim, MD  triamcinolone (KENALOG) 0.1 % paste Use as directed 1 Application in the mouth or throat 2 (two) times daily. Apply to sore in mouth 01/25/22   Teodora Medici, FNP    Family History Family History  Problem  Relation Age of Onset   Heart disease Mother    Stroke Mother    Hypertension Maternal Grandmother    Diabetes Maternal Grandmother    Diabetes Maternal Grandfather    Diabetes Maternal Uncle    Diabetes Maternal Aunt    Social History Social History   Tobacco Use   Smoking status: Never    Passive exposure: Never   Smokeless tobacco: Never  Vaping Use   Vaping Use: Never used  Substance Use Topics   Alcohol use: No   Drug use: No   Allergies   Bee venom, Metronidazole, and Wasp venom  Review of Systems Review of Systems Pertinent findings revealed after performing a 14 point review of systems has been noted in the history of present illness.  Physical Exam Triage Vital Signs ED Triage Vitals  Enc Vitals Group     BP 04/11/21 0827 (!) 147/82     Pulse Rate 04/11/21 0827 72     Resp 04/11/21 0827 18     Temp 04/11/21 0827 98.3 F (36.8 C)     Temp Source 04/11/21 0827 Oral     SpO2 04/11/21 0827 98 %     Weight --  Height --      Head Circumference --      Peak Flow --      Pain Score 04/11/21 0826 5     Pain Loc --      Pain Edu? --      Excl. in GC? --   No data found.  Updated Vital Signs BP 110/74 (BP Location: Right Arm)   Pulse 63   Temp 98 F (36.7 C) (Oral)   Resp 16   LMP 04/03/2022   SpO2 97%   Breastfeeding No   Physical Exam Vitals and nursing note reviewed.  Constitutional:      General: She is not in acute distress.    Appearance: Normal appearance. She is not ill-appearing.  HENT:     Head: Normocephalic and atraumatic.  Eyes:     General: Lids are normal.        Right eye: No discharge.        Left eye: No discharge.     Extraocular Movements: Extraocular movements intact.     Conjunctiva/sclera: Conjunctivae normal.     Right eye: Right conjunctiva is not injected.     Left eye: Left conjunctiva is not injected.  Neck:     Trachea: Trachea and phonation normal.  Cardiovascular:     Rate and Rhythm: Normal rate and  regular rhythm.     Pulses: Normal pulses.     Heart sounds: Normal heart sounds. No murmur heard.    No friction rub. No gallop.  Pulmonary:     Effort: Pulmonary effort is normal. No accessory muscle usage, prolonged expiration or respiratory distress.     Breath sounds: Normal breath sounds. No stridor, decreased air movement or transmitted upper airway sounds. No decreased breath sounds, wheezing, rhonchi or rales.  Chest:     Chest wall: No tenderness.  Genitourinary:    Comments: Patient politely declines pelvic exam today, patient provided a vaginal swab for testing. Musculoskeletal:        General: Normal range of motion.     Cervical back: Normal range of motion and neck supple. Normal range of motion.  Lymphadenopathy:     Cervical: No cervical adenopathy.  Skin:    General: Skin is warm and dry.     Findings: No erythema or rash.  Neurological:     General: No focal deficit present.     Mental Status: She is alert and oriented to person, place, and time.  Psychiatric:        Mood and Affect: Mood normal.        Behavior: Behavior normal.     Visual Acuity Right Eye Distance:   Left Eye Distance:   Bilateral Distance:    Right Eye Near:   Left Eye Near:    Bilateral Near:     UC Couse / Diagnostics / Procedures:     Radiology No results found.  Procedures Procedures (including critical care time) EKG  Pending results:  Labs Reviewed  POCT URINALYSIS DIP (MANUAL ENTRY) - Abnormal; Notable for the following components:      Result Value   Color, UA red (*)    Clarity, UA cloudy (*)    Glucose, UA =100 (*)    Bilirubin, UA large (*)    Ketones, POC UA moderate (40) (*)    Blood, UA large (*)    pH, UA 8.5 (*)    Protein Ur, POC >=300 (*)    Urobilinogen, UA 4.0 (*)  Nitrite, UA Positive (*)    Leukocytes, UA Large (3+) (*)    All other components within normal limits  URINE CULTURE  POCT URINE PREGNANCY  CERVICOVAGINAL ANCILLARY ONLY     Medications Ordered in UC: Medications - No data to display  UC Diagnoses / Final Clinical Impressions(s)   I have reviewed the triage vital signs and the nursing notes.  Pertinent labs & imaging results that were available during my care of the patient were reviewed by me and considered in my medical decision making (see chart for details).    Final diagnoses:  Screening examination for STD (sexually transmitted disease)  Cystitis  Vulvovaginitis candida albicans    Patient was provided with Diflucan 150 mg every 3 days x 2 for empiric treatment of presumed vulvovaginal candidiasis based on the history provided to me today.   Patient was advised to abstain from sexual intercourse for the next 7 days while being treated.  Patient was also advised to use condoms to protect themselves from STD exposure. STD screening was performed, patient advised that the results be posted to their MyChart and if any of the results are positive, they will be notified by phone, further treatment will be provided as indicated based on results of STD screening. Urine pregnancy test was negative.  Urine dip today was positive for hematuria to a large degree, nitrites, and pyuria.  Urine culture will be performed per our protocol.     Patient was advised to begin antibiotics now due to findings on urine dip. Patient advised that they will be contacted with results and that adjustments to treatment will be provided as indicated based on the results.   Patient was advised of possibility that urine culture results may be negative if sample provided was obtained late in the day causing urine to be more diluted.  Patient was advised that if antibiotics were effective after the first 24 to 36 hours, despite negative urine culture result, it is recommended that they complete the full course as prescribed.     Return precautions advised.  ED Prescriptions     Medication Sig Dispense Auth. Provider    sulfamethoxazole-trimethoprim (BACTRIM DS) 800-160 MG tablet Take 1 tablet by mouth 2 (two) times daily for 3 days. 6 tablet Lynden Oxford Scales, PA-C   fluconazole (DIFLUCAN) 150 MG tablet Take 1 tablet today.  Take second tablet 3 days later. 2 tablet Lynden Oxford Scales, PA-C      PDMP not reviewed this encounter.  Disposition Upon Discharge:  Condition: stable for discharge home  Patient presented with concern for an acute illness with associated systemic symptoms and significant discomfort requiring urgent management. In my opinion, this is a condition that a prudent lay person (someone who possesses an average knowledge of health and medicine) may potentially expect to result in complications if not addressed urgently such as respiratory distress, impairment of bodily function or dysfunction of bodily organs.   As such, the patient has been evaluated and assessed, work-up was performed and treatment was provided in alignment with urgent care protocols and evidence based medicine.  Patient/parent/caregiver has been advised that the patient may require follow up for further testing and/or treatment if the symptoms continue in spite of treatment, as clinically indicated and appropriate.  Routine symptom specific, illness specific and/or disease specific instructions were discussed with the patient and/or caregiver at length.  Prevention strategies for avoiding STD exposure were also discussed.  The patient will follow up with their current PCP  if and as advised. If the patient does not currently have a PCP we will assist them in obtaining one.   The patient may need specialty follow up if the symptoms continue, in spite of conservative treatment and management, for further workup, evaluation, consultation and treatment as clinically indicated and appropriate.  Patient/parent/caregiver verbalized understanding and agreement of plan as discussed.  All questions were addressed during visit.   Please see discharge instructions below for further details of plan.  Discharge Instructions:   Discharge Instructions      Based on the symptoms and concerns you shared with me today, you were treated for presumed vaginal yeast infection with fluconazole (Diflucan), take the first tablet today and take the second tablet three days after the first tablet.  I recommend that you abstain from sexual intercourse, tampon use or any other other intravaginal activities while being treated.   Your urine pregnancy test today is negative.  The results of your vaginal swab test which tests for BV, yeast, gonorrhea, chlamydia and trichomonas will be posted to your MyChart account in the next 3 to 5 days.  If any of your results are abnormal, you will receive a phone call regarding further treatment.  Additional prescriptions, if any are needed, will be provided for you at your pharmacy.   Please abstain from sexual intercourse of any kind, vaginal, oral or anal, until until you have received the results of your test.   The urinalysis that we performed in the clinic today was abnormal.  Urine culture will be performed per our protocol.  The result of the urine culture will be available in the next 3 to 5 days and will be posted to your MyChart account.  If there is an abnormal finding, you will be contacted by phone and advised of further treatment recommendations, if any.   You were advised to begin antibiotics today because your urinalysis is concerning for an acute lower urinary tract infection also known as cystitis.     Please pick up and begin taking your prescription for Bactrim DS (trimethoprim sulfamethoxazole) as soon as possible.  Please take all doses exactly as prescribed.  You can take this medication with or without food.  This medication is safe to take with your other medications.   If you have not had complete resolution of your symptoms after completing any needed treatment, please return  for repeat evaluation.   Thank you for visiting urgent care today.  I appreciate the opportunity to participate in your care.       This office note has been dictated using Museum/gallery curator.  Unfortunately, this method of dictation can sometimes lead to typographical or grammatical errors.  I apologize for your inconvenience in advance if this occurs.  Please do not hesitate to reach out to me if clarification is needed.       Lynden Oxford Scales, [place] 04/04/22 [phone]

## 2022-04-05 LAB — URINE CULTURE: Culture: NO GROWTH

## 2022-04-06 LAB — CERVICOVAGINAL ANCILLARY ONLY
Bacterial Vaginitis (gardnerella): NEGATIVE
Candida Glabrata: NEGATIVE
Candida Vaginitis: POSITIVE — AB
Chlamydia: NEGATIVE
Comment: NEGATIVE
Comment: NEGATIVE
Comment: NEGATIVE
Comment: NEGATIVE
Comment: NEGATIVE
Comment: NORMAL
Neisseria Gonorrhea: NEGATIVE
Trichomonas: NEGATIVE

## 2022-04-21 ENCOUNTER — Ambulatory Visit
Admission: EM | Admit: 2022-04-21 | Discharge: 2022-04-21 | Disposition: A | Discharge status: Home / Self Care | Payer: Medicaid Other | Attending: Urgent Care | Admitting: Urgent Care

## 2022-04-21 DIAGNOSIS — B3731 Acute candidiasis of vulva and vagina: Secondary | ICD-10-CM

## 2022-04-21 DIAGNOSIS — N760 Acute vaginitis: Secondary | ICD-10-CM

## 2022-04-21 MED ORDER — FLUCONAZOLE 150 MG PO TABS: ORAL_TABLET | ORAL | 0 refills | Status: DC

## 2022-04-21 NOTE — ED Triage Notes (Signed)
Pt c/o thick white vaginal discharge x3 days. Denies odor. States this happens after her cycle each months since she had her baby 6 months ago.

## 2022-04-21 NOTE — ED Provider Notes (Signed)
[facility]  Note:  This document was prepared using Systems analyst and may include unintentional dictation errors.  MRN: [id number] DOB: Jun 06, 2001  Subjective:   Valerie Newman is a 21 y.o. female presenting for 3-day history of acute onset recurrent white vaginal discharge, vaginal itching and irritation.  Patient has been getting treatment consistently for yeast infections for the past 4 to 5 months.  States that the medication helps while she is on it and then quickly returns.  She is not very sexually active, has low concern for STI but is not opposed to testing.  All of this testing has been negative in the past except for yeast infections.  Patient tries to hydrate with water but she drinks Sprite daily.  She does not do anything extra to the vaginal area.  Has not changed soaps.  Denies dysuria, urinary frequency, hematuria.  She does have an OB/GYN but has not directly discussed her yeast infections.  LMP was 04/03/2022.  No current facility-administered medications for this encounter.  Current Outpatient Medications:    fluconazole (DIFLUCAN) 150 MG tablet, Take 1 tablet today.  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   Allergy to pollen    Anemia    Anxiety    Asthma      Past Surgical History:  Procedure Laterality Date   IUD INSERTION  11/17/2021   IUD REMOVAL  12/27/2021   NO PAST SURGERIES     TOOTH EXTRACTION      Family History  Problem Relation Age of Onset   Heart disease Mother    Stroke Mother    Hypertension Maternal Grandmother    Diabetes Maternal Grandmother    Diabetes Maternal Grandfather    Diabetes Maternal Uncle    Diabetes Maternal Aunt     Social History   Tobacco Use   Smoking status:  Never    Passive exposure: Never   Smokeless tobacco: Never  Vaping Use   Vaping Use: Never used  Substance Use Topics   Alcohol use: No   Drug use: No    ROS   Objective:   Vitals: BP (!) 112/58 (BP Location: Right Arm)   Pulse 77   Temp 98.6 F (37 C) (Oral)   Resp 16   LMP 04/03/2022   SpO2 98%   Breastfeeding No   Physical Exam Constitutional:      General: She is not in acute distress.    Appearance: Normal appearance. She is well-developed. She is not ill-appearing, toxic-appearing or diaphoretic.  HENT:     Head: Normocephalic and atraumatic.     Nose: Nose normal.     Mouth/Throat:     Mouth: Mucous membranes are moist.  Eyes:     General: No scleral icterus.       Right eye: No discharge.        Left eye: No discharge.     Extraocular Movements: Extraocular movements intact.     Conjunctiva/sclera: Conjunctivae normal.  Cardiovascular:     Rate and Rhythm: Normal rate.  Pulmonary:     Effort: Pulmonary effort is normal.  Abdominal:     General: Bowel sounds are normal. There is no distension.  Palpations: Abdomen is soft. There is no mass.     Tenderness: There is no abdominal tenderness. There is no right CVA tenderness, left CVA tenderness, guarding or rebound.  Skin:    General: Skin is warm and dry.  Neurological:     General: No focal deficit present.     Mental Status: She is alert and oriented to person, place, and time.  Psychiatric:        Mood and Affect: Mood normal.        Behavior: Behavior normal.        Thought Content: Thought content normal.        Judgment: Judgment normal.     Assessment and Plan :   PDMP not reviewed this encounter.  1. Recurrent vaginitis   2. Yeast vaginitis     Given the patient has persistent and recurrent yeast vaginitis I will use long-term treatment.  For the first 2 weeks recommended 1 dose every 72 hours for total 4-5 doses.  Thereafter she is to use fluconazole once weekly for the next 6  months.  Follow-up with her OB/GYN.  Labs pending.  Low suspicion for PID.  Counseled patient on potential for adverse effects with medications prescribed/recommended today, ER and return-to-clinic precautions discussed, patient verbalized understanding.    Wallis Bamberg, [place] 04/21/22 [phone]

## 2022-04-22 LAB — CERVICOVAGINAL ANCILLARY ONLY
Bacterial Vaginitis (gardnerella): NEGATIVE
Candida Glabrata: NEGATIVE
Candida Vaginitis: POSITIVE — AB
Chlamydia: NEGATIVE
Comment: NEGATIVE
Comment: NEGATIVE
Comment: NEGATIVE
Comment: NEGATIVE
Comment: NEGATIVE
Comment: NORMAL
Neisseria Gonorrhea: NEGATIVE
Trichomonas: NEGATIVE

## 2022-04-26 ENCOUNTER — Ambulatory Visit
Admission: EM | Admit: 2022-04-26 | Discharge: 2022-04-26 | Disposition: A | Discharge status: Home / Self Care | Payer: Medicaid Other | Attending: Physician Assistant | Admitting: Physician Assistant

## 2022-04-26 ENCOUNTER — Ambulatory Visit (INDEPENDENT_AMBULATORY_CARE_PROVIDER_SITE_OTHER): Payer: Medicaid Other

## 2022-04-26 DIAGNOSIS — M25512 Pain in left shoulder: Secondary | ICD-10-CM | POA: Diagnosis not present

## 2022-04-26 MED ORDER — PREDNISONE 20 MG PO TABS: 40.0000 mg | ORAL_TABLET | Freq: Every day | ORAL | 0 refills | Status: AC

## 2022-04-26 MED ORDER — CYCLOBENZAPRINE HCL 10 MG PO TABS: 10.0000 mg | ORAL_TABLET | Freq: Two times a day (BID) | ORAL | 0 refills | Status: DC | PRN
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
## 2022-04-26 NOTE — Discharge Instructions (Signed)
  Xray without fracture.   Please follow up with ortho if symptoms persist.

## 2022-06-09 ENCOUNTER — Ambulatory Visit (HOSPITAL_COMMUNITY)
Admission: EM | Admit: 2022-06-09 | Discharge: 2022-06-09 | Disposition: A | Discharge status: Home / Self Care | Payer: Medicaid Other | Attending: Emergency Medicine | Admitting: Emergency Medicine

## 2022-06-09 ENCOUNTER — Encounter (HOSPITAL_COMMUNITY): Payer: Self-pay

## 2022-06-09 DIAGNOSIS — N898 Other specified noninflammatory disorders of vagina: Secondary | ICD-10-CM | POA: Diagnosis present

## 2022-06-09 DIAGNOSIS — K5900 Constipation, unspecified: Secondary | ICD-10-CM | POA: Diagnosis present

## 2022-06-09 DIAGNOSIS — M549 Dorsalgia, unspecified: Secondary | ICD-10-CM | POA: Diagnosis present

## 2022-06-09 LAB — POCT URINALYSIS DIPSTICK, ED / UC
Bilirubin Urine: NEGATIVE
Glucose, UA: NEGATIVE mg/dL
Ketones, ur: NEGATIVE mg/dL
Leukocytes,Ua: NEGATIVE
Nitrite: NEGATIVE
Protein, ur: NEGATIVE mg/dL
Specific Gravity, Urine: 1.025 (ref 1.005–1.030)
Urobilinogen, UA: 0.2 mg/dL (ref 0.0–1.0)
pH: 6.5 (ref 5.0–8.0)

## 2022-06-09 LAB — POC URINE PREG, ED: Preg Test, Ur: NEGATIVE

## 2022-06-09 MED ORDER — ACETAMINOPHEN 325 MG PO TABS
ORAL_TABLET | ORAL | Status: AC
  Filled 2022-06-09: qty 2

## 2022-06-09 MED ORDER — ACETAMINOPHEN 325 MG PO TABS
650.0000 mg | ORAL_TABLET | Freq: Once | ORAL | Status: AC
Start: 2022-06-09 — End: 2022-06-09
  Administered 2022-06-09: 650 mg via ORAL

## 2022-06-09 NOTE — ED Provider Notes (Addendum)
[facility]    CSN: [id number] Arrival date & time: 06/09/22  4696     History   Chief Complaint Chief Complaint  Patient presents with   Back Pain   Abdominal Pain   Vaginal Discharge    HPI Valerie Newman is a 21 y.o. female.  Presents with 2 day history of bladder discomfort with urination Milky white vaginal discharge Unprotected intercourse 2 months ago No dysuria, flank pain, fevers LMP 12/1  Some LLQ abdominal pressure No n/v/d Reports history of constipation. Last BM several days ago, hard. Used to take daily stool softener.   Bilat mid-back pain that started yesterday, mostly the left. 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   Allergy to pollen    Anemia    Anxiety    Asthma     Patient Active Problem List   Diagnosis Date Noted   Breakthrough bleeding with IUD 12/26/2021   Infusion reaction 07/15/2021    Past Surgical History:  Procedure Laterality Date   IUD INSERTION  11/17/2021   IUD REMOVAL  12/27/2021   NO PAST SURGERIES     TOOTH EXTRACTION      OB History     Gravida  2   Para  2   Term  2   Preterm  0   AB  0   Living  2      SAB  0   IAB  0   Ectopic  0   Multiple  0   Live Births  2            Home Medications    Prior to Admission medications   Not on File    Family History Family History  Problem Relation Age of Onset   Heart disease Mother    Stroke Mother    Hypertension Maternal Grandmother    Diabetes Maternal Grandmother    Diabetes Maternal Grandfather    Diabetes Maternal Uncle    Diabetes Maternal Aunt     Social History Social History   Tobacco Use   Smoking status: Never    Passive exposure: Never   Smokeless tobacco: Never  Vaping Use   Vaping Use: Never used  Substance Use Topics   Alcohol use: No   Drug use: No     Allergies   Bee venom, Wasp venom, and  Metronidazole   Review of Systems Review of Systems As per HPI  Physical Exam Triage Vital Signs ED Triage Vitals  Enc Vitals Group     BP 06/09/22 0813 114/74     Pulse Rate 06/09/22 0813 80     Resp 06/09/22 0813 12     Temp 06/09/22 0813 98 F (36.7 C)     Temp Source 06/09/22 0813 Oral     SpO2 06/09/22 0813 98 %     Weight --      Height --      Head Circumference --      Peak Flow --      Pain Score 06/09/22 0820 7     Pain Loc --      Pain Edu? --      Excl. in GC? --    No data found.  Updated Vital Signs BP 114/74 (BP Location: Right Wrist)   Pulse 80   Temp 98 F (36.7 C) (Oral)   Resp 12  LMP  (LMP Unknown)   SpO2 98%   Physical Exam Vitals and nursing note reviewed.  Constitutional:      General: She is not in acute distress. Cardiovascular:     Rate and Rhythm: Normal rate and regular rhythm.     Heart sounds: Normal heart sounds.  Pulmonary:     Effort: Pulmonary effort is normal.     Breath sounds: Normal breath sounds.  Abdominal:     General: Bowel sounds are normal. There is no distension.     Palpations: Abdomen is soft.     Tenderness: There is abdominal tenderness in the left lower quadrant. There is no right CVA tenderness, left CVA tenderness, guarding or rebound. Negative signs include Murphy's sign.  Genitourinary:    Comments: Deferred. Self-swab Musculoskeletal:     Comments: Back nontender to palpation. Full ROM of extremities   Neurological:     Mental Status: She is alert and oriented to person, place, and time.     UC Treatments / Results  Labs (all labs ordered are listed, but only abnormal results are displayed) Labs Reviewed  POCT URINALYSIS DIPSTICK, ED / UC - Abnormal; Notable for the following components:      Result Value   Hgb urine dipstick TRACE (*)    All other components within normal limits  POC URINE PREG, ED  CERVICOVAGINAL ANCILLARY ONLY    EKG  Radiology No results  found.  Procedures Procedures   Medications Ordered in UC Medications  acetaminophen (TYLENOL) tablet 650 mg (650 mg Oral Given 06/09/22 0900)    Initial Impression / Assessment and Plan / UC Course  I have reviewed the triage vital signs and the nursing notes.  Pertinent labs & imaging results that were available during my care of the patient were reviewed by me and considered in my medical decision making (see chart for details).  UPT negative UA with trace hgb but no sign of infection Reassuring exam, no CVA tenderness  Cytoswab pending. Treat positive as needed.  Cancel order for HIV/RPR/HepC blood work as 2 attempts made to draw blood.  Hx of recurrent yeast infection, recommend add fluconazole if abx therapy is indicated.  Discussed constipation, recommend re-start stool softener and continue prn, increase fluids. May be the cause of abdominal discomfort  Back pain No red flags. Tylenol dose given in clinic. Recommend medication at home, monitor for worsening symptoms.  Patient will establish with primary care provider and follow up regarding her concerns, and for annual physicals. She can have blood work drawn with them as well. Return precautions discussed. Patient agrees to plan  Final Clinical Impressions(s) / UC Diagnoses   Final diagnoses:  Vaginal discharge  Constipation, unspecified constipation type  Mid back pain     Discharge Instructions      We will call you if anything on your swab or blood work returns positive. Please abstain from sexual intercourse until your results return.  Try a stool softener for constipation and increase fluid intake.  I recommend tylenol every 4-6 hours for back pain, but you can alternate with ibuprofen if you have something on the stomach.  Please establish and follow up with a primary care provider.     ED Prescriptions   None    PDMP not reviewed this encounter.   Amayrani Bennick, Ray Church 06/09/22 0914     Christoper Bushey, Lurena Joiner, PA-C 06/09/22 [phone]

## 2022-06-09 NOTE — Discharge Instructions (Addendum)
We will call you if anything on your swab or blood work returns positive. Please abstain from sexual intercourse until your results return.  Try a stool softener for constipation and increase fluid intake.  I recommend tylenol every 4-6 hours for back pain, but you can alternate with ibuprofen if you have something on the stomach.  Please establish and follow up with a primary care provider.

## 2022-06-09 NOTE — ED Triage Notes (Signed)
Pt has been having some abdominal pain, vaginal discharge with some pressure while urinating ,and back pain x few days

## 2022-06-10 LAB — CERVICOVAGINAL ANCILLARY ONLY
Bacterial Vaginitis (gardnerella): NEGATIVE
Candida Glabrata: POSITIVE — AB
Candida Vaginitis: NEGATIVE
Chlamydia: NEGATIVE
Comment: NEGATIVE
Comment: NEGATIVE
Comment: NEGATIVE
Comment: NEGATIVE
Comment: NEGATIVE
Comment: NORMAL
Neisseria Gonorrhea: NEGATIVE
Trichomonas: NEGATIVE

## 2022-06-11 ENCOUNTER — Telehealth (HOSPITAL_COMMUNITY): Payer: Self-pay | Admitting: Emergency Medicine

## 2022-06-11 MED ORDER — FLUCONAZOLE 150 MG PO TABS: 150.0000 mg | ORAL_TABLET | Freq: Once | ORAL | 0 refills | Status: AC

## 2022-06-16 ENCOUNTER — Encounter (HOSPITAL_COMMUNITY): Payer: Self-pay

## 2022-06-21 ENCOUNTER — Inpatient Hospital Stay (HOSPITAL_COMMUNITY)
Admission: EM | Admit: 2022-06-21 | Discharge: 2022-06-21 | Disposition: A | Discharge status: Home / Self Care | Payer: Medicaid Other | Attending: Obstetrics & Gynecology | Admitting: Obstetrics & Gynecology

## 2022-06-21 ENCOUNTER — Inpatient Hospital Stay (HOSPITAL_COMMUNITY)
Admission: AD | Admit: 2022-06-21 | Payer: Medicaid Other | Source: Home / Self Care | Admitting: Obstetrics & Gynecology

## 2022-06-21 ENCOUNTER — Other Ambulatory Visit: Payer: Self-pay

## 2022-06-21 ENCOUNTER — Inpatient Hospital Stay (HOSPITAL_COMMUNITY): Payer: Medicaid Other

## 2022-06-21 ENCOUNTER — Encounter (HOSPITAL_COMMUNITY): Payer: Self-pay | Admitting: Obstetrics & Gynecology

## 2022-06-21 DIAGNOSIS — O3680X0 Pregnancy with inconclusive fetal viability, not applicable or unspecified: Secondary | ICD-10-CM | POA: Diagnosis not present

## 2022-06-21 DIAGNOSIS — O23591 Infection of other part of genital tract in pregnancy, first trimester: Secondary | ICD-10-CM | POA: Insufficient documentation

## 2022-06-21 DIAGNOSIS — O26891 Other specified pregnancy related conditions, first trimester: Secondary | ICD-10-CM | POA: Diagnosis present

## 2022-06-21 DIAGNOSIS — Z3A01 Less than 8 weeks gestation of pregnancy: Secondary | ICD-10-CM

## 2022-06-21 DIAGNOSIS — N760 Acute vaginitis: Secondary | ICD-10-CM

## 2022-06-21 DIAGNOSIS — O0281 Inappropriate change in quantitative human chorionic gonadotropin (hCG) in early pregnancy: Secondary | ICD-10-CM | POA: Diagnosis not present

## 2022-06-21 DIAGNOSIS — B9689 Other specified bacterial agents as the cause of diseases classified elsewhere: Secondary | ICD-10-CM | POA: Insufficient documentation

## 2022-06-21 HISTORY — DX: Headache, unspecified: R51.9

## 2022-06-21 HISTORY — DX: Unspecified ovarian cyst, unspecified side: N83.209

## 2022-06-21 HISTORY — DX: Urinary tract infection, site not specified: N39.0

## 2022-06-21 HISTORY — DX: Depression, unspecified: F32.A

## 2022-06-21 LAB — WET PREP, GENITAL
Sperm: NONE SEEN
Trich, Wet Prep: NONE SEEN
WBC, Wet Prep HPF POC: >=10 — AB (ref <10)
Yeast Wet Prep HPF POC: NONE SEEN

## 2022-06-21 LAB — URINALYSIS, ROUTINE W REFLEX MICROSCOPIC
Bilirubin Urine: NEGATIVE
Glucose, UA: NEGATIVE mg/dL
Hgb urine dipstick: NEGATIVE
Ketones, ur: NEGATIVE mg/dL
Leukocytes,Ua: NEGATIVE
Nitrite: NEGATIVE
Protein, ur: NEGATIVE mg/dL
Specific Gravity, Urine: 1.017 (ref 1.005–1.030)
pH: 7 (ref 5.0–8.0)

## 2022-06-21 LAB — BASIC METABOLIC PANEL
Anion gap: 8 (ref 5–15)
BUN: 8 mg/dL (ref 6–20)
CO2: 24 mmol/L (ref 22–32)
Calcium: 9.1 mg/dL (ref 8.9–10.3)
Chloride: 101 mmol/L (ref 98–111)
Creatinine, Ser: 0.73 mg/dL (ref 0.44–1.00)
GFR, Estimated: >60 mL/min (ref >60)
Glucose, Bld: 88 mg/dL (ref 70–99)
Potassium: 3.7 mmol/L (ref 3.5–5.1)
Sodium: 133 mmol/L — ABNORMAL LOW (ref 135–145)

## 2022-06-21 LAB — CBC WITH DIFFERENTIAL/PLATELET
Abs Immature Granulocytes: 0.03 10*3/uL (ref 0.00–0.07)
Basophils Absolute: 0 10*3/uL (ref 0.0–0.1)
Basophils Relative: 0 %
Eosinophils Absolute: 0.3 10*3/uL (ref 0.0–0.5)
Eosinophils Relative: 3 %
HCT: 33.9 % — ABNORMAL LOW (ref 36.0–46.0)
Hemoglobin: 11.2 g/dL — ABNORMAL LOW (ref 12.0–15.0)
Immature Granulocytes: 0 %
Lymphocytes Relative: 13 %
Lymphs Abs: 1.5 10*3/uL (ref 0.7–4.0)
MCH: 26.5 pg (ref 26.0–34.0)
MCHC: 33 g/dL (ref 30.0–36.0)
MCV: 80.3 fL (ref 80.0–100.0)
Monocytes Absolute: 0.7 10*3/uL (ref 0.1–1.0)
Monocytes Relative: 6 %
Neutro Abs: 8.8 10*3/uL — ABNORMAL HIGH (ref 1.7–7.7)
Neutrophils Relative %: 78 %
Platelets: 269 10*3/uL (ref 150–400)
RBC: 4.22 MIL/uL (ref 3.87–5.11)
RDW: 14 % (ref 11.5–15.5)
WBC: 11.2 10*3/uL — ABNORMAL HIGH (ref 4.0–10.5)
nRBC: 0 % (ref 0.0–0.2)

## 2022-06-21 LAB — HCG, QUANTITATIVE, PREGNANCY: hCG, Beta Chain, Quant, S: 315 m[IU]/mL — ABNORMAL HIGH (ref <5)

## 2022-06-21 LAB — I-STAT BETA HCG BLOOD, ED (MC, WL, AP ONLY): I-stat hCG, quantitative: 349.7 m[IU]/mL — ABNORMAL HIGH (ref <5)

## 2022-06-21 MED ORDER — METRONIDAZOLE 0.75 % VA GEL: 1.0000 | Freq: Every day | VAGINAL | 1 refills | Status: DC

## 2022-06-21 NOTE — ED Notes (Signed)
MAU called and patient transported to by [facility] to MAU

## 2022-06-21 NOTE — ED Provider Triage Note (Signed)
Emergency Medicine Provider Triage Evaluation Note  Alizae Bechtel , a 22 y.o. female  was evaluated in triage.    Patient reports that she has been having a yeast infection for a few months she reports has been treated by her primary care provider without improvement.  She reports a white vaginal discharge.  She does report occasional lower abdominal pain that has been a more recent occurrence.  She did take a pregnancy test at home she is not sure if it was positive but does report there is a faint line there.  She denies fever nausea vomiting diarrhea dysuria hematuria or any additional concerns   Review of Systems  Positive: Vaginal discharge, lower abdominal pain Negative: Fever nausea vomiting diarrhea dysuria hematuria or any other concerns  Physical Exam  BP 119/71   Pulse 91   Temp 98.7 F (37.1 C)   Resp 15   LMP  (LMP Unknown)   SpO2 100%  Gen:   Awake, no distress   Resp:  Normal effort  MSK:   Moves extremities without difficulty  Other:  No abdominal tenderness palpation.  Abdomen is soft.  Medical Decision Making  Medically screening exam initiated at 10:04 AM.  Appropriate orders placed.  Tiarna Koppen was informed that the remainder of the evaluation will be completed by another provider, this initial triage assessment does not replace that evaluation, and the importance of remaining in the ED until their evaluation is complete.   Note: Portions of this report may have been transcribed using voice recognition software. Every effort was made to ensure accuracy; however, inadvertent computerized transcription errors may still be present.    Deliah Boston, PA-C 06/21/22 1006

## 2022-06-21 NOTE — MAU Note (Signed)
Valerie Newman is a 22 y.o. at Unknown here in MAU reporting: been having real bad pelvic pain, started 3 days ago, got worse this morning.  No bleeding.  LMP: no clue, is completely surprised Onset of complaint: 3 days ago Pain score: none now, denies any pain at this time Vitals:   06/21/22 0958  BP: 119/71  Pulse: 91  Resp: 15  Temp: 98.7 F (37.1 C)  SpO2: 100%     Lab orders placed from triage:

## 2022-06-21 NOTE — MAU Provider Note (Signed)
History     CSN: [id number]  Arrival date and time: 06/21/22 0949   None     Chief Complaint  Patient presents with   Pelvic Pain   Valerie Newman , a  22 y.o. G3P2002 at [redacted]w[redacted]d presents to MAU after being transfer from ED for a positive I-stat quant in the presence of on-going abdominal pain. Patient states she has been having on-going pain for weeks but this morning patient reports pain was "severe" with movement and states she "couldn't pick up her baby." She States last intercourse was 06/06/22. Denies other pregnancy symptoms. She denies nausea and vomiting. She endorses abnormal vaginal discharge and a odor without bleeding. Patient states last LMP was 05/04/22.        Pelvic Pain The patient's primary symptoms include pelvic pain. The patient's pertinent negatives include no vaginal discharge. Pertinent negatives include no abdominal pain, back pain, chills, constipation, diarrhea, dysuria, fever, headaches, nausea or vomiting.    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   Allergy to pollen    Anemia    Anxiety    Asthma    Depression    post partum   Headache    Ovarian cyst    UTI (urinary tract infection)     Past Surgical History:  Procedure Laterality Date   IUD INSERTION  11/17/2021   IUD REMOVAL  12/27/2021   NO PAST SURGERIES     TOOTH EXTRACTION      Family History  Problem Relation Age of Onset   Heart disease Mother    Stroke Mother    Healthy Father    Diabetes Maternal Aunt    Diabetes Maternal Uncle    Hypertension Maternal Grandmother    Diabetes Maternal Grandmother    Diabetes Maternal Grandfather     Social History   Tobacco Use   Smoking status: Never    Passive exposure: Never   Smokeless tobacco: Never  Vaping Use    Vaping Use: Never used  Substance Use Topics   Alcohol use: No   Drug use: No    Allergies:  Allergies  Allergen Reactions   Bee Venom Swelling   Wasp Venom Swelling   Metronidazole Nausea And Vomiting    Medications Prior to Admission  Medication Sig Dispense Refill Last Dose   acetaminophen (TYLENOL) 500 MG tablet Take 1,000 mg by mouth every 6 (six) hours as needed. headache   06/19/2022   ibuprofen (ADVIL) 800 MG tablet Take 800 mg by mouth every 8 (eight) hours as needed. headache   06/18/2022    Review of Systems  Constitutional:  Negative for chills, fatigue and fever.  Eyes:  Negative for pain and visual disturbance.  Respiratory:  Negative for apnea, shortness of breath and wheezing.   Cardiovascular:  Negative for chest pain and palpitations.  Gastrointestinal:  Negative for abdominal pain, constipation, diarrhea, nausea and vomiting.  Genitourinary:  Positive for pelvic pain. Negative for difficulty urinating, dysuria, vaginal bleeding, vaginal discharge and vaginal pain.  Musculoskeletal:  Negative for back pain.  Neurological:  Negative for seizures, weakness and headaches.  Psychiatric/Behavioral:  Negative for suicidal ideas.    Physical Exam   Blood pressure 131/74, pulse 93, temperature 99 F (37.2 C), temperature source Oral, resp. rate 18, height 5\' 3"  (1.6 m), weight 56.9 kg, last menstrual period 05/04/2022, SpO2 100 %, not currently breastfeeding.  Physical Exam Vitals and nursing note reviewed.  Constitutional:      General: She is not in acute distress.    Appearance: Normal appearance.  HENT:     Head: Normocephalic.  Pulmonary:     Effort: Pulmonary effort is normal.  Musculoskeletal:     Cervical back: Normal range of motion.  Skin:    General: Skin is warm and dry.     Capillary Refill: Capillary refill takes less than 2 seconds.  Neurological:     Mental Status: She is alert and oriented to person, place, and time.  Psychiatric:         Mood and Affect: Mood normal.     MAU Course  Procedures Orders Placed This Encounter  Procedures   Wet prep, genital   US OB LESS THAN 14 WEEKS WITH OB TRANSVAGINAL   CBC with Differential   Basic metabolic panel   Urinalysis, Routine w reflex microscopic   hCG, quantitative, pregnancy   I-Stat Beta hCG blood, ED ([facility], [facility], [facility] only)   Discharge patient   Results for orders placed or performed during the hospital encounter of 06/21/22 (from the past 24 hour(s))  CBC with Differential     Status: Abnormal   Collection Time: 06/21/22 10:05 AM  Result Value Ref Range   WBC 11.2 (H) 4.0 - 10.5 K/uL   RBC 4.22 3.87 - 5.11 MIL/uL   Hemoglobin 11.2 (L) 12.0 - 15.0 g/dL   HCT 78.4 (L) 69.6 - 29.5 %   MCV 80.3 80.0 - 100.0 fL   MCH 26.5 26.0 - 34.0 pg   MCHC 33.0 30.0 - 36.0 g/dL   RDW 28.4 13.2 - 44.0 %   Platelets 269 150 - 400 K/uL   nRBC 0.0 0.0 - 0.2 %   Neutrophils Relative % 78 %   Neutro Abs 8.8 (H) 1.7 - 7.7 K/uL   Lymphocytes Relative 13 %   Lymphs Abs 1.5 0.7 - 4.0 K/uL   Monocytes Relative 6 %   Monocytes Absolute 0.7 0.1 - 1.0 K/uL   Eosinophils Relative 3 %   Eosinophils Absolute 0.3 0.0 - 0.5 K/uL   Basophils Relative 0 %   Basophils Absolute 0.0 0.0 - 0.1 K/uL   Immature Granulocytes 0 %   Abs Immature Granulocytes 0.03 0.00 - 0.07 K/uL  Basic metabolic panel     Status: Abnormal   Collection Time: 06/21/22 10:05 AM  Result Value Ref Range   Sodium 133 (L) 135 - 145 mmol/L   Potassium 3.7 3.5 - 5.1 mmol/L   Chloride 101 98 - 111 mmol/L   CO2 24 22 - 32 mmol/L   Glucose, Bld 88 70 - 99 mg/dL   BUN 8 6 - 20 mg/dL   Creatinine, Ser 1.02 0.44 - 1.00 mg/dL   Calcium 9.1 8.9 - 72.5 mg/dL   GFR, Estimated >36 >64 mL/min   Anion gap 8 5 - 15  Urinalysis, Routine w reflex microscopic     Status: None   Collection Time: 06/21/22 10:53 AM  Result Value Ref Range   Color, Urine YELLOW YELLOW   APPearance CLEAR CLEAR  Specific Gravity, Urine 1.017 1.005 - 1.030    pH 7.0 5.0 - 8.0   Glucose, UA NEGATIVE NEGATIVE mg/dL   Hgb urine dipstick NEGATIVE NEGATIVE   Bilirubin Urine NEGATIVE NEGATIVE   Ketones, ur NEGATIVE NEGATIVE mg/dL   Protein, ur NEGATIVE NEGATIVE mg/dL   Nitrite NEGATIVE NEGATIVE   Leukocytes,Ua NEGATIVE NEGATIVE  I-Stat Beta hCG blood, ED ([facility], [facility], [facility] only)     Status: Abnormal   Collection Time: 06/21/22 11:24 AM  Result Value Ref Range   I-stat hCG, quantitative 349.7 (H) <5 mIU/mL   Comment 3          hCG, quantitative, pregnancy     Status: Abnormal   Collection Time: 06/21/22 12:34 PM  Result Value Ref Range   hCG, Beta Chain, Quant, S 315 (H) <5 mIU/mL  Wet prep, genital     Status: Abnormal   Collection Time: 06/21/22  1:57 PM   Specimen: Vaginal  Result Value Ref Range   Yeast Wet Prep HPF POC NONE SEEN NONE SEEN   Trich, Wet Prep NONE SEEN NONE SEEN   Clue Cells Wet Prep HPF POC PRESENT (A) NONE SEEN   WBC, Wet Prep HPF POC >=10 (A) <10   Sperm NONE SEEN    US OB LESS THAN 14 WEEKS WITH OB TRANSVAGINAL  Result Date: 06/21/2022 CLINICAL DATA:  Crampy abdominal pain in pregnancy EXAM: OBSTETRIC <14 WK [place] AND TRANSVAGINAL OB US TECHNIQUE: Both transabdominal and transvaginal ultrasound examinations were performed for complete evaluation of the gestation as well as the maternal uterus, adnexal regions, and pelvic cul-de-sac. Transvaginal technique was performed to assess early pregnancy. COMPARISON:  None Available. FINDINGS: Intrauterine gestational sac: None Yolk sac:  Not seen Embryo:  Not seen Cardiac Activity: Not seen Subchorionic hemorrhage:  None visualized. Maternal uterus/adnexae: There is 3.7 x 3 x 2.2 cm simple appearing cyst in right ovary, possibly functional cyst. IMPRESSION: There is no demonstrable intrauterine gestational suggesting pregnancy of unknown location. If pregnancy test is positive, differential diagnostic possibilities would include very early IUP or failed gestation with complete abortion or  ectopic gestation. Serial HCG estimations and follow-up sonogram in 1-2 weeks may be considered. There is 3.7 cm simple appearing cyst in the right adnexa suggesting functional right ovarian cyst. Electronically Signed   By: Ernie Avena M.D.   On: 06/21/2022 14:40    Lab results from ED reviewed and interpreted by me.    MDM  - Wet prep positive for BV.  - GC Pending upon discharge  - Quant 315.  - [place] results revealed a pregnancy of unknown location. No IUP observed.  - Plan for discharge.   Assessment and Plan   1. Pregnancy, location unknown   2. Elevated level of quantitative hCG for gestational age in early pregnancy   3. [redacted] weeks gestation of pregnancy   4. BV (bacterial vaginosis)    - Reviewed results for pregnancy of unknown location. Also discussed option of early pregnancy too early to identify.  - Recommendation for patient to repeat Quant in 48 hours. Patient agreeable to plan of care.  - Patient scheduled for Repeat Quant at [facility] on Tuesday.  - Reviewed results of BV. Patient has a GI reaction to Metronidazole. Rx for Metro gel sent to outpatient pharmacy for pick up. Patient reports no reactions to metro gel insert with use previously.  - Worsening signs and return precautions reviewed.  - Patient discharged home in stable condition and may return to MAU  as needed.   Claudette Head, MSN CNM  06/21/2022, 3:10 PM

## 2022-06-21 NOTE — ED Triage Notes (Signed)
Pt here with pelvic pain and vaginal discharge. Seen at [facility] for the same. Also states unsure if she is pregnant. Denies dysuria.

## 2022-06-22 LAB — GC/CHLAMYDIA PROBE AMP (~~LOC~~) NOT AT ARMC
Chlamydia: NEGATIVE
Comment: NEGATIVE
Comment: NORMAL
Neisseria Gonorrhea: NEGATIVE

## 2022-06-23 ENCOUNTER — Ambulatory Visit (INDEPENDENT_AMBULATORY_CARE_PROVIDER_SITE_OTHER): Payer: Medicaid Other

## 2022-06-23 VITALS — BP 106/61 | HR 62

## 2022-06-23 DIAGNOSIS — O3680X0 Pregnancy with inconclusive fetal viability, not applicable or unspecified: Secondary | ICD-10-CM

## 2022-06-23 DIAGNOSIS — Z3A00 Weeks of gestation of pregnancy not specified: Secondary | ICD-10-CM

## 2022-06-23 LAB — BETA HCG QUANT (REF LAB): hCG Quant: 728 m[IU]/mL

## 2022-06-23 NOTE — Progress Notes (Signed)
Beta HCG Follow-up Visit  Valerie Newman presents to [facility] for follow-up beta HCG lab. She was seen in MAU for abdominal pain on 06/21/22. [place] that day did not confirm IUP. Patient reports vaginal spotting and continued abdominal cramping today. Discussed with patient that we are following beta HCG levels today. I will call the patient with results. This is an unplanned pregnancy; pt is considering abortion. Reports food insecurity; taken to food market prior to check out.  Beta HCG results: 06/21/22 @ 0814 481  06/23/22 @ 8563 149   Results and patient history reviewed with Valerie Plover, MD, who finds this to be an appropriate rise in HCG and recommends repeat US 14 days from first [place] to confirm location of pregnancy. Patient called and informed of plan for follow-up. [place] scheduled for 07/07/22 at 9:30 AM. Pt requests information regarding abortion; area clinics reviewed. Pt asks about adoption services. Offered for patient to talk about this at new OB visit if she chooses to continue pregnancy.  Valerie Newman 06/23/2022 9:24 AM

## 2022-06-24 ENCOUNTER — Ambulatory Visit: Payer: Medicaid Other

## 2022-06-26 ENCOUNTER — Ambulatory Visit: Payer: Medicaid Other

## 2022-07-01 ENCOUNTER — Ambulatory Visit
Admission: EM | Admit: 2022-07-01 | Discharge: 2022-07-01 | Disposition: A | Discharge status: Home / Self Care | Payer: Medicaid Other | Attending: Internal Medicine | Admitting: Internal Medicine

## 2022-07-01 DIAGNOSIS — Z3A00 Weeks of gestation of pregnancy not specified: Secondary | ICD-10-CM | POA: Insufficient documentation

## 2022-07-01 DIAGNOSIS — J069 Acute upper respiratory infection, unspecified: Secondary | ICD-10-CM | POA: Insufficient documentation

## 2022-07-01 DIAGNOSIS — R058 Other specified cough: Secondary | ICD-10-CM | POA: Insufficient documentation

## 2022-07-01 DIAGNOSIS — O99519 Diseases of the respiratory system complicating pregnancy, unspecified trimester: Secondary | ICD-10-CM | POA: Diagnosis not present

## 2022-07-01 DIAGNOSIS — Z1152 Encounter for screening for COVID-19: Secondary | ICD-10-CM | POA: Diagnosis not present

## 2022-07-01 DIAGNOSIS — J45909 Unspecified asthma, uncomplicated: Secondary | ICD-10-CM | POA: Insufficient documentation

## 2022-07-01 DIAGNOSIS — J029 Acute pharyngitis, unspecified: Secondary | ICD-10-CM

## 2022-07-01 LAB — POCT RAPID STREP A (OFFICE): Rapid Strep A Screen: NEGATIVE

## 2022-07-01 NOTE — ED Triage Notes (Signed)
Pt c/o cough, sore throat,   Onset ~ yesterday

## 2022-07-01 NOTE — Discharge Instructions (Signed)
Rapid strep test was negative.  Throat culture and COVID test are pending.  We will call if they are positive.  It appears that you have a viral upper respiratory infection which should run its course and self resolve with symptomatic treatment as we discussed.  You must be careful with your daily medications given that you are currently pregnant.  Tylenol is safe.  Advised adequate fluid hydration and rest as well.

## 2022-07-01 NOTE — ED Provider Notes (Signed)
[name]    CSN: [id number] Arrival date & time: 07/01/22  1114      History   Chief Complaint Chief Complaint  Patient presents with   Cough    HPI Valerie Newman is a 22 y.o. female.   Patient presents with sore throat, cough, nasal congestion that started yesterday.  States nasal congestion started today.  Her daughter has had similar symptoms recently.  She denies any known fevers at home.  Reports history of asthma but has not had to use albuterol inhaler or had any shortness of breath since symptoms started.  Denies chest pain, ear pain, nausea, vomiting, diarrhea, abdominal pain.  Patient does not report any medications to help alleviate symptoms.  Reports that she is currently pregnant but is not sure how far along she is.   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   Allergy to pollen    Anemia    Anxiety    Asthma    Depression    post partum   Headache    Ovarian cyst    UTI (urinary tract infection)     Patient Active Problem List   Diagnosis Date Noted   Breakthrough bleeding with IUD 12/26/2021   Infusion reaction 07/15/2021    Past Surgical History:  Procedure Laterality Date   IUD INSERTION  11/17/2021   IUD REMOVAL  12/27/2021   NO PAST SURGERIES     TOOTH EXTRACTION      OB History     Gravida  3   Para  2   Term  2   Preterm  0   AB  0   Living  2      SAB  0   IAB  0   Ectopic  0   Multiple  0   Live Births  2            Home Medications    Prior to Admission medications   Medication Sig Start Date End Date Taking? Authorizing Provider  acetaminophen (TYLENOL) 500 MG tablet Take 1,000 mg by mouth every 6 (six) hours as needed. headache    [provider]  metroNIDAZOLE (METROGEL) 0.75 % vaginal gel Place 1 Applicatorful vaginally at bedtime. Apply one applicatorful to vagina at bedtime for 5 days 06/21/22   Deloris Ping, CNM    Family  History Family History  Problem Relation Age of Onset   Heart disease Mother    Stroke Mother    Healthy Father    Diabetes Maternal Aunt    Diabetes Maternal Uncle    Hypertension Maternal Grandmother    Diabetes Maternal Grandmother    Diabetes Maternal Grandfather     Social History Social History   Tobacco Use   Smoking status: Never    Passive exposure: Never   Smokeless tobacco: Never  Vaping Use   Vaping Use: Never used  Substance Use Topics   Alcohol use: No   Drug use: No     Allergies   Bee venom, Wasp venom, and Metronidazole   Review of Systems Review of Systems Per HPI  Physical Exam Triage Vital Signs ED Triage Vitals [07/01/22 1145]  Enc Vitals Group     BP 106/70     Pulse Rate 87     Resp 16     Temp 98 F (36.7 C)     Temp Source Oral  SpO2 99 %     Weight      Height      Head Circumference      Peak Flow      Pain Score 0     Pain Loc      Pain Edu?      Excl. in GC?    No data found.  Updated Vital Signs BP 106/70 (BP Location: Right Arm)   Pulse 87   Temp 98 F (36.7 C) (Oral)   Resp 16   LMP 05/04/2022   SpO2 99%   Breastfeeding Unknown   Visual Acuity Right Eye Distance:   Left Eye Distance:   Bilateral Distance:    Right Eye Near:   Left Eye Near:    Bilateral Near:     Physical Exam Constitutional:      General: She is not in acute distress.    Appearance: Normal appearance. She is not toxic-appearing or diaphoretic.  HENT:     Head: Normocephalic and atraumatic.     Right Ear: Tympanic membrane and ear canal normal.     Left Ear: Tympanic membrane and ear canal normal.     Nose: Congestion present.     Mouth/Throat:     Mouth: Mucous membranes are moist.     Pharynx: Posterior oropharyngeal erythema present.  Eyes:     Extraocular Movements: Extraocular movements intact.     Conjunctiva/sclera: Conjunctivae normal.     Pupils: Pupils are equal, round, and reactive to light.  Cardiovascular:      Rate and Rhythm: Normal rate and regular rhythm.     Pulses: Normal pulses.     Heart sounds: Normal heart sounds.  Pulmonary:     Effort: Pulmonary effort is normal. No respiratory distress.     Breath sounds: Normal breath sounds. No stridor. No wheezing, rhonchi or rales.  Abdominal:     General: Abdomen is flat. Bowel sounds are normal.     Palpations: Abdomen is soft.  Musculoskeletal:        General: Normal range of motion.     Cervical back: Normal range of motion.  Skin:    General: Skin is warm and dry.  Neurological:     General: No focal deficit present.     Mental Status: She is alert and oriented to person, place, and time. Mental status is at baseline.  Psychiatric:        Mood and Affect: Mood normal.        Behavior: Behavior normal.      UC Treatments / Results  Labs (all labs ordered are listed, but only abnormal results are displayed) Labs Reviewed  SARS CORONAVIRUS 2 (TAT 6-24 HRS)  CULTURE, GROUP A STREP [facility])  POCT RAPID STREP A (OFFICE)    EKG   Radiology No results found.  Procedures Procedures (including critical care time)  Medications Ordered in UC Medications - No data to display  Initial Impression / Assessment and Plan / UC Course  I have reviewed the triage vital signs and the nursing notes.  Pertinent labs & imaging results that were available during my care of the patient were reviewed by me and considered in my medical decision making (see chart for details).     Patient presents with symptoms likely from a viral upper respiratory infection. Differential includes bacterial pneumonia, sinusitis, allergic rhinitis, COVID-19, flu, RSV. Do not suspect underlying cardiopulmonary process. Symptoms seem unlikely related to ACS, CHF or COPD exacerbations, pneumonia, pneumothorax. Patient  is nontoxic appearing and not in need of emergent medical intervention.  Rapid strep is negative.  Throat culture and COVID test pending.  Do not have  RSV or flu testing capabilities here in urgent care at this time.  Recommended symptom control with medications and supportive care that are safe with pregnancy.  Discussed this with patient.  Patient reports that she has an appointment with OB/GYN tomorrow.  Return if symptoms fail to improve in 1-2 weeks or you develop shortness of breath, chest pain, severe headache. Patient states understanding and is agreeable.  Discharged with PCP followup.  Final Clinical Impressions(s) / UC Diagnoses   Final diagnoses:  Viral upper respiratory tract infection with cough  Sore throat     Discharge Instructions      Rapid strep test was negative.  Throat culture and COVID test are pending.  We will call if they are positive.  It appears that you have a viral upper respiratory infection which should run its course and self resolve with symptomatic treatment as we discussed.  You must be careful with your daily medications given that you are currently pregnant.  Tylenol is safe.  Advised adequate fluid hydration and rest as well.     ED Prescriptions   None    PDMP not reviewed this encounter.   Teodora Medici, [place] 07/01/22 1228

## 2022-07-02 LAB — SARS CORONAVIRUS 2 (TAT 6-24 HRS): SARS Coronavirus 2: NEGATIVE

## 2022-07-03 LAB — CULTURE, GROUP A STREP (THRC)

## 2022-07-07 ENCOUNTER — Encounter: Payer: Self-pay | Admitting: Advanced Practice Midwife

## 2022-07-07 ENCOUNTER — Ambulatory Visit (INDEPENDENT_AMBULATORY_CARE_PROVIDER_SITE_OTHER): Payer: Medicaid Other

## 2022-07-07 ENCOUNTER — Ambulatory Visit (INDEPENDENT_AMBULATORY_CARE_PROVIDER_SITE_OTHER): Payer: Medicaid Other | Admitting: Advanced Practice Midwife

## 2022-07-07 DIAGNOSIS — Z3491 Encounter for supervision of normal pregnancy, unspecified, first trimester: Secondary | ICD-10-CM

## 2022-07-07 DIAGNOSIS — O219 Vomiting of pregnancy, unspecified: Secondary | ICD-10-CM

## 2022-07-07 DIAGNOSIS — Z3A01 Less than 8 weeks gestation of pregnancy: Secondary | ICD-10-CM | POA: Diagnosis not present

## 2022-07-07 DIAGNOSIS — O3680X0 Pregnancy with inconclusive fetal viability, not applicable or unspecified: Secondary | ICD-10-CM

## 2022-07-07 MED ORDER — DOXYLAMINE-PYRIDOXINE 10-10 MG PO TBEC: 1.0000 | DELAYED_RELEASE_TABLET | Freq: Three times a day (TID) | ORAL | 3 refills | Status: DC | PRN

## 2022-07-07 MED ORDER — ONDANSETRON 8 MG PO TBDP: 4.0000 mg | ORAL_TABLET | Freq: Four times a day (QID) | ORAL | 2 refills | Status: DC | PRN

## 2022-07-07 NOTE — Progress Notes (Signed)
Ultrasounds Results Note  SUBJECTIVE HPI:  [name] is a 22 y.o. [id number] at [redacted]w[redacted]d by  uncertain LMP  who presents to [facility] for followup ultrasound results. The patient reports abdominal pain or vaginal bleeding. Reports constant nausea. Hasn't tried anything for Sx. Had significant N/V w/ previous pregnancy. Considering termination or adoption.    Upon review of the patient's records, patient was first seen in MAU on 06/21/22 for abd pain.   Previous HCG's  Latest Reference Range & Units 06/21/22 10:05 06/21/22 11:24 06/21/22 12:34 06/23/22 09:38  Glucose 70 - 99 mg/dL 88     hCG Quant mIU/mL    728  HCG, Beta Chain, Quant, S <5 mIU/mL   315 (H)   I-stat hCG, quantitative <5 mIU/mL  349.7 (H)    (H): Data is abnormally high  Previous US IMPRESSION: There is no demonstrable intrauterine gestational suggesting pregnancy of unknown location. If pregnancy test is positive, differential diagnostic possibilities would include very early IUP or failed gestation with complete abortion or ectopic gestation. Serial HCG estimations and follow-up sonogram in 1-2 weeks may be considered.   There is 3.7 cm simple appearing cyst in the right adnexa suggesting functional right ovarian cyst.    --/--/A POS (04/13 0325)  Repeat ultrasound was performed earlier today.   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   Allergy to pollen    Anemia    Anxiety    Asthma    Depression    post partum   Headache    Ovarian cyst    UTI (urinary tract infection)    Past Surgical History:  Procedure Laterality Date   IUD INSERTION  11/17/2021   IUD REMOVAL  12/27/2021   NO PAST SURGERIES     TOOTH EXTRACTION     Social History   Socioeconomic History   Marital status: Single    Spouse name: Not on file   Number of children: 1   Years of education: Not on file   Highest education level: High school graduate  Occupational History   Not  on file  Tobacco Use   Smoking status: Never    Passive exposure: Never   Smokeless tobacco: Never  Vaping Use   Vaping Use: Never used  Substance and Sexual Activity   Alcohol use: No   Drug use: No   Sexual activity: Yes    Birth control/protection: None  Other Topics Concern   Not on file  Social History Narrative   Not on file   Social Determinants of Health   Financial Resource Strain: Not on file  Food Insecurity: Food Insecurity Present (06/23/2022)   Hunger Vital Sign    Worried About Running Out of Food in the Last Year: Often true    Ran Out of Food in the Last Year: Often true  Transportation Needs: No Transportation Needs (12/26/2021)   PRAPARE - Hydrologist (Medical): No    Lack of Transportation (Non-Medical): No  Physical Activity: Not on file  Stress: Not on file  Social Connections: Not on file  Intimate Partner Violence: Not on file   Current Outpatient Medications on File Prior to Visit  Medication Sig Dispense Refill   acetaminophen (TYLENOL) 500 MG tablet Take 1,000 mg by mouth every 6 (six) hours as needed. headache     metroNIDAZOLE (METROGEL) 0.75 % vaginal gel Place 1 Applicatorful vaginally at  bedtime. Apply one applicatorful to vagina at bedtime for 5 days 70 g 1   No current facility-administered medications on file prior to visit.   Allergies  Allergen Reactions   Bee Venom Swelling   Wasp Venom Swelling   Metronidazole Nausea And Vomiting    I have reviewed patient's Past Medical Hx, Surgical Hx, Family Hx, Social Hx, medications and allergies.   Review of Systems Review of Systems  Constitutional: Negative for fever and chills. Pos for N/V. Gastrointestinal: Negative for abdominal pain.  Genitourinary: Negative for vaginal bleeding.  Musculoskeletal: Negative for back pain.  Neurological: Negative for dizziness and weakness.    Physical Exam  LMP 05/22/2022   Patient's last menstrual period was  05/22/2022. GENERAL: Well-developed, well-nourished female in no acute distress.  HEENT: Normocephalic, atraumatic.   LUNGS: Effort normal ABDOMEN: Deferred HEART: Regular rate  SKIN: Warm, dry and without erythema PSYCH: Normal mood and affect NEURO: Alert and oriented x 4  LAB RESULTS No results found for this or any previous visit (from the past 24 hour(s)).  IMAGING 6-1 week Live SIUP. FHR 119.   ASSESSMENT 1. Nausea and vomiting during pregnancy   2. [redacted] weeks gestation of pregnancy   3. Normal IUP (intrauterine pregnancy) on prenatal ultrasound, first trimester   4. Uncertain dates, antepartum, first trimester     PLAN Will use EDD by [place] since pt is unsure of LMP. Unsure of pregnancy plans. Support given. List of Ob/Gyn providers given. Rx Diclegis and Zofran for N/V Patient advised to start/continue taking prenatal vitamins Go to MAU as needed for heavy bleeding, abdominal pain or fever greater than 100.4.  Vincent, [place] 07/07/2022 10:07 AM

## 2022-07-07 NOTE — Patient Instructions (Addendum)
[place] for [employer] at [facility]             [address] [place], [place] [address] [place] [facility] [employer] at [place], [place], [place], [place], [address] [phone]  Center for [facility] at [address], [place], [place], [place], [address] [phone]  Center for [facility] at [facility] [address] [place], [place], [place], [address] [phone]  Center for [place] at [facility]                                 [place], [place], [place], [address] [phone]  Center for [place] at [facility]                                    [address] [place], [place], [address] [place] [facility] [place] at [facility] [address], [place], [place], [place], [address]                              [facility] [place], [place], [place], [place], [address] [phone]  [facility]         Phone: [phone]  [facility]      Phone: [facility]      Phone: [place] [facility]         Phone: [phone]   [place] [facility]    Phone: [place]      Phone: [phone]  [facility]     Phone: [phone]  [facility]        Phone: [phone]  [facility]) [phone]  [facility]      Phone: [phone]  [facility] [address] [place],  [address] Phone: [phone]  Safe Medications in Pregnancy   Acne: Benzoyl  Peroxide Salicylic Acid  Backache/Headache: Tylenol: 2 regular strength every 4 hours OR              2 Extra strength every 6 hours  Colds/Coughs/Allergies: Benadryl (alcohol free) 25 mg every 6 hours as needed Breath right strips Claritin Cepacol throat lozenges Chloraseptic throat spray Cold-Eeze- up to three times per day Cough drops, alcohol free Flonase (by prescription only) Guaifenesin Mucinex Robitussin DM (plain only, alcohol free) Saline nasal spray/drops Sudafed (pseudoephedrine) & Actifed ** use only after [redacted] weeks gestation and if you do not have high blood pressure Tylenol Vicks Vaporub Zinc lozenges Zyrtec   Constipation: Colace  Ducolax suppositories Fleet enema Glycerin suppositories Metamucil Milk of magnesia Miralax Senokot Smooth move tea  Diarrhea: Kaopectate Imodium A-D  *NO pepto Bismol  Hemorrhoids: Anusol Anusol HC Preparation H Tucks  Indigestion: Tums Maalox Mylanta Zantac  Pepcid  Insomnia: Benadryl (alcohol free) 25mg  every 6 hours as needed Tylenol PM Unisom, no Gelcaps  Leg Cramps: Tums MagGel  Nausea/Vomiting:  Bonine Dramamine Emetrol Ginger extract Sea bands Meclizine  Nausea medication to take during pregnancy:  Unisom (doxylamine succinate 25 mg tablets) Take one tablet daily at bedtime. If symptoms are not adequately controlled, the dose can be increased to a maximum recommended dose of two tablets daily (1/2 tablet in the morning, 1/2 tablet mid-afternoon and one at bedtime). Vitamin B6 100mg  tablets. Take one tablet twice a day (up to 200 mg per day).  Skin Rashes: Aveeno products Benadryl cream or 25mg  every 6 hours as needed Calamine Lotion 1% cortisone cream  Yeast infection: Gyne-lotrimin 7 Monistat 7   **If taking multiple medications, please check labels to avoid duplicating the same active ingredients **take medication as directed on the label ** Do not exceed 4000 mg of tylenol in  24 hours **Do not take medications that contain aspirin or ibuprofen

## 2022-07-10 ENCOUNTER — Inpatient Hospital Stay (HOSPITAL_COMMUNITY)
Admission: AD | Admit: 2022-07-10 | Discharge: 2022-07-10 | Disposition: A | Discharge status: Home / Self Care | Payer: Medicaid Other | Attending: Obstetrics and Gynecology | Admitting: Obstetrics and Gynecology

## 2022-07-10 DIAGNOSIS — O26891 Other specified pregnancy related conditions, first trimester: Secondary | ICD-10-CM | POA: Diagnosis present

## 2022-07-10 DIAGNOSIS — Z3A01 Less than 8 weeks gestation of pregnancy: Secondary | ICD-10-CM | POA: Diagnosis not present

## 2022-07-10 DIAGNOSIS — L298 Other pruritus: Secondary | ICD-10-CM | POA: Insufficient documentation

## 2022-07-10 DIAGNOSIS — O2241 Hemorrhoids in pregnancy, first trimester: Secondary | ICD-10-CM | POA: Diagnosis not present

## 2022-07-10 DIAGNOSIS — L299 Pruritus, unspecified: Secondary | ICD-10-CM

## 2022-07-10 MED ORDER — DIPHENHYDRAMINE HCL 25 MG PO CAPS
25.0000 mg | ORAL_CAPSULE | Freq: Once | ORAL | Status: AC
  Administered 2022-07-10: 25 mg via ORAL
  Filled 2022-07-10: qty 1

## 2022-07-10 MED ORDER — DIPHENHYDRAMINE HCL 50 MG PO TABS: 25.0000 mg | ORAL_TABLET | Freq: Every evening | ORAL | 0 refills | Status: DC | PRN

## 2022-07-10 MED ORDER — FAMOTIDINE 20 MG PO TABS
40.0000 mg | ORAL_TABLET | Freq: Once | ORAL | Status: AC
  Administered 2022-07-10: 40 mg via ORAL
  Filled 2022-07-10: qty 2

## 2022-07-10 NOTE — MAU Provider Note (Signed)
History     CSN: [id number]  Arrival date and time: 07/10/22 0114   None     Chief Complaint  Patient presents with   Pruritis   HPI  Valerie Newman is a 22 y.o. female [id number] at [redacted]w[redacted]d here with itching all over her body. The symptoms started following the Use of hemorrhoid cream . She has used this cream before and did not have any issues.    She used this medication 2 x and shortly after she starting having all over body itching. It has been about an hour since she took it. She reports itching in her hair, feet, and all over her body. She has no rash.   She has not taken benadryl or any medication to help.   She has no shortness of breath or itching in her throat.   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   Allergy to pollen    Anemia    Anxiety    Asthma    Depression    post partum   Headache    Ovarian cyst    UTI (urinary tract infection)     Past Surgical History:  Procedure Laterality Date   IUD INSERTION  11/17/2021   IUD REMOVAL  12/27/2021   NO PAST SURGERIES     TOOTH EXTRACTION      Family History  Problem Relation Age of Onset   Heart disease Mother    Stroke Mother    Healthy Father    Diabetes Maternal Aunt    Diabetes Maternal Uncle    Hypertension Maternal Grandmother    Diabetes Maternal Grandmother    Diabetes Maternal Grandfather     Social History   Tobacco Use   Smoking status: Never    Passive exposure: Never   Smokeless tobacco: Never  Vaping Use   Vaping Use: Never used  Substance Use Topics   Alcohol use: No   Drug use: No    Allergies:  Allergies  Allergen Reactions   Bee Venom Swelling   Wasp Venom Swelling   Metronidazole Nausea And Vomiting    Medications Prior to Admission  Medication Sig  Dispense Refill Last Dose   acetaminophen (TYLENOL) 500 MG tablet Take 1,000 mg by mouth every 6 (six) hours as needed. headache      Doxylamine-Pyridoxine 10-10 MG TBEC Take 1-2 tablets by mouth 3 (three) times daily as needed (nausea). Start with 2 tablets every evening. If symptoms persist, add 1 tablet every morning. If symptoms persist, add 1 tablet at mid-day. 60 tablet 3    metroNIDAZOLE (METROGEL) 0.75 % vaginal gel Place 1 Applicatorful vaginally at bedtime. Apply one applicatorful to vagina at bedtime for 5 days 70 g 1    ondansetron (ZOFRAN-ODT) 8 MG disintegrating tablet Take 0.5-1 tablets (4-8 mg total) by mouth every 6 (six) hours as needed for nausea or vomiting. 20 tablet 2     Review of Systems  Respiratory:  Negative for chest tightness, shortness of breath, wheezing and stridor.  Physical Exam   Blood pressure 122/73, pulse 86, temperature 98.4 F (36.9 C), temperature source Oral, resp. rate 17, height 5\' 3"  (1.6 m), weight 59.7 kg, last menstrual period 05/22/2022, SpO2 100 %, unknown if currently breastfeeding.  Physical Exam Vitals and nursing note reviewed.  Constitutional:      General: She is not in acute distress.    Appearance: Normal appearance. She is not ill-appearing, toxic-appearing or diaphoretic.  Pulmonary:     Effort: Pulmonary effort is normal.     Breath sounds: Normal breath sounds.  Neurological:     Mental Status: She is alert and oriented to person, place, and time.  Psychiatric:        Behavior: Behavior normal.    MAU Course  Procedures  MDM  Benadryl 25 mg given in MAU. The patient was kept for monitoring and reports complete improvement of itching following benadryl.   Assessment and Plan   A:  1. Pruritus   2. [redacted] weeks gestation of pregnancy     P:  Dc home Return to MAU if symptoms worsen  Jorita Bohanon, Artist Pais, NP 07/18/2022 4:42 PM

## 2022-07-10 NOTE — MAU Note (Signed)
..  Valerie Newman is a 22 y.o. at [redacted]w[redacted]d here in MAU reporting: Used her hemorrhoid medication and she started itching all over her body. Denies pain or vaginal bleeding.  Reports that she has taken the medication before and did not have this reaction.  Pain score: 0/10 Vitals:   07/10/22 0128  BP: 122/73  Pulse: 86  Resp: 17  Temp: 98.4 F (36.9 C)  SpO2: 100%

## 2022-07-29 ENCOUNTER — Ambulatory Visit
Admission: EM | Admit: 2022-07-29 | Discharge: 2022-07-29 | Disposition: A | Discharge status: Home / Self Care | Payer: Medicaid Other | Attending: Internal Medicine | Admitting: Internal Medicine

## 2022-07-29 DIAGNOSIS — N898 Other specified noninflammatory disorders of vagina: Secondary | ICD-10-CM | POA: Insufficient documentation

## 2022-07-29 DIAGNOSIS — R350 Frequency of micturition: Secondary | ICD-10-CM | POA: Insufficient documentation

## 2022-07-29 DIAGNOSIS — Z113 Encounter for screening for infections with a predominantly sexual mode of transmission: Secondary | ICD-10-CM | POA: Diagnosis present

## 2022-07-29 LAB — POCT URINALYSIS DIP (MANUAL ENTRY)
Bilirubin, UA: NEGATIVE
Blood, UA: NEGATIVE
Glucose, UA: NEGATIVE mg/dL
Ketones, POC UA: NEGATIVE mg/dL
Leukocytes, UA: NEGATIVE
Nitrite, UA: NEGATIVE
Protein Ur, POC: NEGATIVE mg/dL
Spec Grav, UA: 1.02 (ref 1.010–1.025)
Urobilinogen, UA: 0.2 E.U./dL
pH, UA: 7.5 (ref 5.0–8.0)

## 2022-07-29 MED ORDER — MICONAZOLE NITRATE 2 % VA CREA: 1.0000 | TOPICAL_CREAM | Freq: Every day | VAGINAL | 0 refills | Status: DC

## 2022-07-29 NOTE — Discharge Instructions (Signed)
I am treating you for suspicion of yeast infection with vaginal cream.  Vaginal swab is pending.  Will call if it is abnormal.  Please follow-up with your OB/GYN as well.

## 2022-07-29 NOTE — ED Triage Notes (Signed)
Pt c/o white vaginal discharge with slight odor. C/o urinary frequency for a week.

## 2022-07-29 NOTE — ED Provider Notes (Signed)
[name]    CSN: [id number] Arrival date & time: 07/29/22  1446      History   Chief Complaint Chief Complaint  Patient presents with   Vaginal Discharge    HPI Valerie Newman is a 22 y.o. female.   Patient presents with white vaginal discharge and irritation with associated urinary frequency that started about 3 days ago.  Patient denies urinary burning, abdominal pain, back pain, fever, abnormal vaginal bleeding, hematuria.  Patient reports that she is currently [redacted] weeks pregnant.  She has a history of recurrent yeast infections and states that this feels similar.  Patient denies any exposure to STD or any recent unprotected intercourse.   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   Allergy to pollen    Anemia    Anxiety    Asthma    Depression    post partum   Headache    Nausea and vomiting during pregnancy 07/21/2017   Ovarian cyst    UTI (urinary tract infection)     Patient Active Problem List   Diagnosis Date Noted   Infusion reaction 07/15/2021   Nausea and vomiting during pregnancy 07/21/2017    Past Surgical History:  Procedure Laterality Date   IUD INSERTION  11/17/2021   IUD REMOVAL  12/27/2021   NO PAST SURGERIES     TOOTH EXTRACTION      OB History     Gravida  3   Para  2   Term  2   Preterm  0   AB  0   Living  2      SAB  0   IAB  0   Ectopic  0   Multiple  0   Live Births  2            Home Medications    Prior to Admission medications   Medication Sig Start Date End Date Taking? Authorizing Provider  miconazole (MONISTAT 7) 2 % vaginal cream Place 1 Applicatorful vaginally at bedtime. Apply daily for 7 days 07/29/22  Yes [place], [place] E, [place]  acetaminophen (TYLENOL) 500 MG tablet Take 1,000 mg by mouth every 6 (six) hours as needed. headache    [provider]  diphenhydrAMINE (BENADRYL) 50 MG tablet Take 0.5 tablets (25 mg total) by  mouth at bedtime as needed for itching. 07/10/22   Rasch, Anderson Malta I, NP  Doxylamine-Pyridoxine 10-10 MG TBEC Take 1-2 tablets by mouth 3 (three) times daily as needed (nausea). Start with 2 tablets every evening. If symptoms persist, add 1 tablet every morning. If symptoms persist, add 1 tablet at mid-day. 07/07/22   Tamala Julian, [place], CNM  ondansetron (ZOFRAN-ODT) 8 MG disintegrating tablet Take 0.5-1 tablets (4-8 mg total) by mouth every 6 (six) hours as needed for nausea or vomiting. 07/07/22   Manya Silvas, CNM    Family History Family History  Problem Relation Age of Onset   Heart disease Mother    Stroke Mother    Healthy Father    Diabetes Maternal Aunt    Diabetes Maternal Uncle    Hypertension Maternal Grandmother    Diabetes Maternal Grandmother    Diabetes Maternal Grandfather     Social History Social History   Tobacco Use   Smoking status: Never    Passive exposure: Never   Smokeless tobacco: Never  Vaping Use   Vaping Use: Never used  Substance Use Topics  Alcohol use: No   Drug use: No     Allergies   Bee venom, Wasp venom, and Metronidazole   Review of Systems Review of Systems Per HPI  Physical Exam Triage Vital Signs ED Triage Vitals  Enc Vitals Group     BP 07/29/22 1517 102/64     Pulse Rate 07/29/22 1517 (!) 102     Resp 07/29/22 1517 20     Temp 07/29/22 1517 98.1 F (36.7 C)     Temp Source 07/29/22 1517 Oral     SpO2 07/29/22 1517 98 %     Weight --      Height --      Head Circumference --      Peak Flow --      Pain Score 07/29/22 1526 0     Pain Loc --      Pain Edu? --      Excl. in [place]? --    No data found.  Updated Vital Signs BP 102/64 (BP Location: Left Arm)   Pulse (!) 102   Temp 98.1 F (36.7 C) (Oral)   Resp 20   LMP 05/22/2022 (Approximate)   SpO2 98%   Breastfeeding No   Visual Acuity Right Eye Distance:   Left Eye Distance:   Bilateral Distance:    Right Eye Near:   Left Eye Near:    Bilateral Near:      Physical Exam Constitutional:      General: She is not in acute distress.    Appearance: Normal appearance. She is not toxic-appearing or diaphoretic.  HENT:     Head: Normocephalic and atraumatic.  Eyes:     Extraocular Movements: Extraocular movements intact.     Conjunctiva/sclera: Conjunctivae normal.  Cardiovascular:     Rate and Rhythm: Normal rate and regular rhythm.     Pulses: Normal pulses.     Heart sounds: Normal heart sounds.  Pulmonary:     Effort: Pulmonary effort is normal. No respiratory distress.     Breath sounds: Normal breath sounds.  Abdominal:     General: Bowel sounds are normal. There is no distension.     Palpations: Abdomen is soft.     Tenderness: There is no abdominal tenderness.  Genitourinary:    Comments: Deferred with shared decision making.  Self swab performed. Neurological:     General: No focal deficit present.     Mental Status: She is alert and oriented to person, place, and time. Mental status is at baseline.  Psychiatric:        Mood and Affect: Mood normal.        Behavior: Behavior normal.        Thought Content: Thought content normal.        Judgment: Judgment normal.      UC Treatments / Results  Labs (all labs ordered are listed, but only abnormal results are displayed) Labs Reviewed  POCT URINALYSIS DIP (MANUAL ENTRY)  CERVICOVAGINAL ANCILLARY ONLY    EKG   Radiology No results found.  Procedures Procedures (including critical care time)  Medications Ordered in UC Medications - No data to display  Initial Impression / Assessment and Plan / UC Course  I have reviewed the triage vital signs and the nursing notes.  Pertinent labs & imaging results that were available during my care of the patient were reviewed by me and considered in my medical decision making (see chart for details).     Urinalysis completely unremarkable.  Patient reporting that the symptoms feel similar to her recurrent yeast infections.   Therefore, will treat with topical miconazole given that she is currently pregnant.  Cervicovaginal swab pending.  Will await results given no confirmed exposure to STD.  Patient advised to follow-up with OB/GYN as well.  Discussed return precautions.  Patient verbalized understanding and was agreeable with plan. Final Clinical Impressions(s) / UC Diagnoses   Final diagnoses:  Vaginal discharge  Urinary frequency  Screening examination for venereal disease     Discharge Instructions      I am treating you for suspicion of yeast infection with vaginal cream.  Vaginal swab is pending.  Will call if it is abnormal.  Please follow-up with your OB/GYN as well.    ED Prescriptions     Medication Sig Dispense Auth. Provider   miconazole (MONISTAT 7) 2 % vaginal cream Place 1 Applicatorful vaginally at bedtime. Apply daily for 7 days 45 g Teodora Medici, [place]      PDMP not reviewed this encounter.   Teodora Medici, [place] 07/29/22 [phone]

## 2022-07-30 LAB — CERVICOVAGINAL ANCILLARY ONLY
Bacterial Vaginitis (gardnerella): NEGATIVE
Candida Glabrata: POSITIVE — AB
Candida Vaginitis: POSITIVE — AB
Chlamydia: NEGATIVE
Comment: NEGATIVE
Comment: NEGATIVE
Comment: NEGATIVE
Comment: NEGATIVE
Comment: NEGATIVE
Comment: NORMAL
Neisseria Gonorrhea: NEGATIVE
Trichomonas: NEGATIVE

## 2022-08-12 ENCOUNTER — Telehealth: Payer: Self-pay | Admitting: *Deleted

## 2022-08-12 ENCOUNTER — Encounter: Payer: Self-pay | Admitting: *Deleted

## 2022-08-12 ENCOUNTER — Telehealth: Payer: Medicaid Other

## 2022-08-12 NOTE — Telephone Encounter (Signed)
Patient [facility] new ob intake appointment.  At 0815 I called and left a message I was calling about her appointment and to log onto Mychart if she received this message before 0830, otherwise call to reschedule. Staci Acosta [phone] Patient still not logged onto virtual visit . I called and left another message I am calling about her appointment and to call us to reschedule or cancel upcoming appointments if not needed or going elsewhere.I also called her contact for her Mother and heard a busy signal. I will also send a MyChart message.  Staci Acosta

## 2022-08-13 ENCOUNTER — Ambulatory Visit
Admission: EM | Admit: 2022-08-13 | Discharge: 2022-08-13 | Disposition: A | Discharge status: Home / Self Care | Payer: Medicaid Other | Attending: Nurse Practitioner | Admitting: Nurse Practitioner

## 2022-08-13 ENCOUNTER — Ambulatory Visit (INDEPENDENT_AMBULATORY_CARE_PROVIDER_SITE_OTHER): Payer: Medicaid Other

## 2022-08-13 DIAGNOSIS — M25572 Pain in left ankle and joints of left foot: Secondary | ICD-10-CM

## 2022-08-13 DIAGNOSIS — W19XXXA Unspecified fall, initial encounter: Secondary | ICD-10-CM | POA: Diagnosis not present

## 2022-08-13 DIAGNOSIS — S93402A Sprain of unspecified ligament of left ankle, initial encounter: Secondary | ICD-10-CM | POA: Diagnosis not present

## 2022-08-13 NOTE — Discharge Instructions (Addendum)
Your x-ray was negative for fracture.  Will treat for an ankle sprain with rest, elevation, ice as needed Ace wrap for compression and stabilization of the joint Over-the-counter Tylenol or ibuprofen as needed for pain Activity as tolerated Follow-up with your PCP if symptoms do not improve Please go to the emergency room if you have any worsening symptoms

## 2022-08-13 NOTE — ED Triage Notes (Signed)
Pt c/o of left ankle pain after tripping and rolling ankle last night.

## 2022-08-13 NOTE — ED Provider Notes (Signed)
[facility]    CSN: [id number] Arrival date & time: 08/13/22  0909      History   Chief Complaint Chief Complaint  Patient presents with   Ankle Pain    HPI Valerie Newman is a 22 y.o. female presents for evaluation of ankle pain.  Patient reports yesterday she rolled her left ankle twice.  After the second time she was unable to bear weight without pain.  Does endorse a bruised feeling on the medial aspect of the ankle but denies swelling or numbness or tingling.  Reports a history of sprains to this ankle but denies history of fractures or surgeries.  She has not used any OTC medications for symptoms.  Denies any other injuries or concerns at this time.   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   Allergy to pollen    Anemia    Anxiety    Asthma    Depression    post partum   Headache    Nausea and vomiting during pregnancy 07/21/2017   Ovarian cyst    UTI (urinary tract infection)     Patient Active Problem List   Diagnosis Date Noted   Infusion reaction 07/15/2021   Nausea and vomiting during pregnancy 07/21/2017    Past Surgical History:  Procedure Laterality Date   IUD INSERTION  11/17/2021   IUD REMOVAL  12/27/2021   NO PAST SURGERIES     TOOTH EXTRACTION      OB History     Gravida  3   Para  2   Term  2   Preterm  0   AB  0   Living  2      SAB  0   IAB  0   Ectopic  0   Multiple  0   Live Births  2            Home Medications    Prior to Admission medications   Medication Sig Start Date End Date Taking? Authorizing Provider  acetaminophen (TYLENOL) 500 MG tablet Take 1,000 mg by mouth every 6 (six) hours as needed. headache    [provider]  diphenhydrAMINE (BENADRYL) 50 MG tablet Take 0.5 tablets (25 mg total) by mouth at bedtime as needed for itching. 07/10/22   Rasch, Anderson Malta I, NP  Doxylamine-Pyridoxine 10-10 MG TBEC Take 1-2 tablets by mouth 3  (three) times daily as needed (nausea). Start with 2 tablets every evening. If symptoms persist, add 1 tablet every morning. If symptoms persist, add 1 tablet at mid-day. 07/07/22   Tamala Julian, [place], CNM  miconazole (MONISTAT 7) 2 % vaginal cream Place 1 Applicatorful vaginally at bedtime. Apply daily for 7 days 07/29/22   Teodora Medici, FNP  ondansetron (ZOFRAN-ODT) 8 MG disintegrating tablet Take 0.5-1 tablets (4-8 mg total) by mouth every 6 (six) hours as needed for nausea or vomiting. 07/07/22   Manya Silvas, CNM    Family History Family History  Problem Relation Age of Onset   Heart disease Mother    Stroke Mother    Healthy Father    Diabetes Maternal Aunt    Diabetes Maternal Uncle    Hypertension Maternal Grandmother    Diabetes Maternal Grandmother    Diabetes Maternal Grandfather     Social History Social History   Tobacco Use   Smoking status: Never    Passive exposure: Never   Smokeless tobacco:  Never  Vaping Use   Vaping Use: Never used  Substance Use Topics   Alcohol use: No   Drug use: No     Allergies   Bee venom, Wasp venom, and Metronidazole   Review of Systems Review of Systems  Musculoskeletal:        Left ankle pain/injury     Physical Exam Triage Vital Signs ED Triage Vitals  Enc Vitals Group     BP 08/13/22 0925 110/74     Pulse Rate 08/13/22 0925 94     Resp 08/13/22 0925 18     Temp 08/13/22 0925 98.1 F (36.7 C)     Temp Source 08/13/22 0925 Oral     SpO2 08/13/22 0925 96 %     Weight --      Height --      Head Circumference --      Peak Flow --      Pain Score 08/13/22 0923 7     Pain Loc --      Pain Edu? --      Excl. in [place]? --    No data found.  Updated Vital Signs BP 110/74 (BP Location: Right Arm)   Pulse 94   Temp 98.1 F (36.7 C) (Oral)   Resp 18   LMP  (LMP Unknown)   SpO2 96%   Breastfeeding Unknown   Visual Acuity Right Eye Distance:   Left Eye Distance:   Bilateral Distance:    Right Eye Near:    Left Eye Near:    Bilateral Near:     Physical Exam Vitals and nursing note reviewed.  Constitutional:      Appearance: Normal appearance.  HENT:     Head: Normocephalic and atraumatic.  Eyes:     Pupils: Pupils are equal, round, and reactive to light.  Cardiovascular:     Rate and Rhythm: Normal rate.  Pulmonary:     Effort: Pulmonary effort is normal.  Musculoskeletal:     Left ankle: No swelling, deformity, ecchymosis or lacerations. Tenderness present over the medial malleolus. Normal range of motion. Normal pulse.     Comments: No tenderness to left foot with palpation.  DP +2.  Skin:    General: Skin is warm and dry.  Neurological:     General: No focal deficit present.     Mental Status: She is alert and oriented to person, place, and time.  Psychiatric:        Mood and Affect: Mood normal.        Behavior: Behavior normal.      UC Treatments / Results  Labs (all labs ordered are listed, but only abnormal results are displayed) Labs Reviewed - No data to display  EKG   Radiology DG Ankle Complete Left  Result Date: 08/13/2022 CLINICAL DATA:  Left ankle injury after fall. EXAM: LEFT ANKLE COMPLETE - 3+ VIEW COMPARISON:  None Available. FINDINGS: There is no evidence of fracture, dislocation, or joint effusion. There is no evidence of arthropathy or other focal bone abnormality. Soft tissues are unremarkable. IMPRESSION: Negative. Electronically Signed   By: Marijo Conception M.D.   On: 08/13/2022 09:47    Procedures Procedures (including critical care time)  Medications Ordered in UC Medications - No data to display  Initial Impression / Assessment and Plan / UC Course  I have reviewed the triage vital signs and the nursing notes.  Pertinent labs & imaging results that were available during my care of the  patient were reviewed by me and considered in my medical decision making (see chart for details).     Reviewed exam and symptoms with patient.  X-ray  negative for fracture Discussed ankle sprain and RICE therapy Ace wrap applied in clinic OTC analgesics as needed Follow-up with PCP if symptoms or not improving ER precautions reviewed and patient verbalized understanding Final Clinical Impressions(s) / UC Diagnoses   Final diagnoses:  Acute left ankle pain  Sprain of left ankle, unspecified ligament, initial encounter     Discharge Instructions      Your x-ray was negative for fracture.  Will treat for an ankle sprain with rest, elevation, ice as needed Ace wrap for compression and stabilization of the joint Over-the-counter Tylenol or ibuprofen as needed for pain Activity as tolerated Follow-up with your PCP if symptoms do not improve Please go to the emergency room if you have any worsening symptoms     ED Prescriptions   None    PDMP not reviewed this encounter.   Melynda Ripple, NP 08/13/22 [phone]

## 2022-08-18 ENCOUNTER — Encounter: Payer: Self-pay | Admitting: Family Medicine

## 2022-09-16 ENCOUNTER — Telehealth (INDEPENDENT_AMBULATORY_CARE_PROVIDER_SITE_OTHER): Payer: Medicaid Other | Admitting: *Deleted

## 2022-09-16 DIAGNOSIS — Z3491 Encounter for supervision of normal pregnancy, unspecified, first trimester: Secondary | ICD-10-CM

## 2022-09-16 NOTE — Progress Notes (Signed)
8:26, patient not joined virtually for appointment . I called and informed her of appointment and if she is still able to keep appointment. She states she is and will join . Staci Acosta I connected with  Valerie Newman on 09/16/22 at 8:30 by MyChart virtual visit  and verified that I am speaking with the correct person using two identifiers. Patient in car that she parked. Nurse in office.   I discussed the limitations, risks, security and privacy concerns of performing an evaluation and management service by telephone and the availability of in person appointments. I also discussed with the patient that there may be a patient responsible charge related to this service. The patient expressed understanding and agreed to proceed.  Once I verified EDD patient informed me she is no longer pregnant. She had expressed previously possible plans to terminate. She states she is doing well. I informed her if she needs any other visits to let us know. She states she would like to schedule checkup. I informed her I would inform registrars to contact her.  Staci Acosta 09/16/2022  8:31 AM

## 2022-09-22 ENCOUNTER — Encounter: Payer: Medicaid Other | Admitting: Family Medicine

## 2022-09-28 ENCOUNTER — Ambulatory Visit
Admission: EM | Admit: 2022-09-28 | Discharge: 2022-09-28 | Disposition: A | Discharge status: Home / Self Care | Payer: Medicaid Other | Attending: Family Medicine | Admitting: Family Medicine

## 2022-09-28 DIAGNOSIS — N910 Primary amenorrhea: Secondary | ICD-10-CM

## 2022-09-28 LAB — POCT URINE PREGNANCY: Preg Test, Ur: NEGATIVE

## 2022-09-28 NOTE — Discharge Instructions (Signed)
Your pregnancy test was negative here

## 2022-09-28 NOTE — ED Provider Notes (Addendum)
[name]    CSN: [id number] Arrival date & time: 09/28/22  0907      History   Chief Complaint Chief Complaint  Patient presents with   requesting upreg    HPI Valerie Newman is a 22 y.o. female.   HPI Here for missed period.  Last normal menstrual cycle was March 1.  She has had a little nausea but no vomiting and no breast tenderness.  No abdominal pain.  She did have a positive pregnancy test and some abdominal pain in January.  She has then terminated her pregnancy in February.  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   Allergy to pollen    Anemia    Anxiety    Asthma    Depression    post partum   Headache    Nausea and vomiting during pregnancy 07/21/2017   Ovarian cyst    UTI (urinary tract infection)     Patient Active Problem List   Diagnosis Date Noted   Infusion reaction 07/15/2021   Nausea and vomiting during pregnancy 07/21/2017    Past Surgical History:  Procedure Laterality Date   IUD INSERTION  11/17/2021   IUD REMOVAL  12/27/2021   NO PAST SURGERIES     TOOTH EXTRACTION      OB History     Gravida  3   Para  2   Term  2   Preterm  0   AB  0   Living  2      SAB  0   IAB  0   Ectopic  0   Multiple  0   Live Births  2            Home Medications    Prior to Admission medications   Medication Sig Start Date End Date Taking? Authorizing Provider  acetaminophen (TYLENOL) 500 MG tablet Take 1,000 mg by mouth every 6 (six) hours as needed. headache    [provider]    Family History Family History  Problem Relation Age of Onset   Heart disease Mother    Stroke Mother    Healthy Father    Diabetes Maternal Aunt    Diabetes Maternal Uncle    Hypertension Maternal Grandmother    Diabetes Maternal Grandmother    Diabetes Maternal Grandfather     Social History Social History   Tobacco Use   Smoking status: Never    Passive exposure:  Never   Smokeless tobacco: Never  Vaping Use   Vaping Use: Never used  Substance Use Topics   Alcohol use: No   Drug use: No     Allergies   Bee venom, Wasp venom, and Metronidazole   Review of Systems Review of Systems   Physical Exam Triage Vital Signs ED Triage Vitals  Enc Vitals Group     BP 09/28/22 0929 110/74     Pulse Rate 09/28/22 0929 82     Resp 09/28/22 0929 20     Temp 09/28/22 0929 98 F (36.7 C)     Temp Source 09/28/22 0929 Oral     SpO2 09/28/22 0929 98 %     Weight --      Height --      Head Circumference --      Peak Flow --      Pain Score 09/28/22 0939 0     Pain Loc --  Pain Edu? --      Excl. in [place]? --    No data found.  Updated Vital Signs BP 110/74 (BP Location: Left Arm)   Pulse 82   Temp 98 F (36.7 C) (Oral)   Resp 20   LMP 08/14/2022 (Exact Date)   SpO2 98%   Breastfeeding No   Visual Acuity Right Eye Distance:   Left Eye Distance:   Bilateral Distance:    Right Eye Near:   Left Eye Near:    Bilateral Near:     Physical Exam Vitals reviewed.  Constitutional:      General: She is not in acute distress.    Appearance: She is not ill-appearing, toxic-appearing or diaphoretic.  HENT:     Mouth/Throat:     Mouth: Mucous membranes are moist.  Eyes:     Extraocular Movements: Extraocular movements intact.     Pupils: Pupils are equal, round, and reactive to light.  Cardiovascular:     Rate and Rhythm: Normal rate and regular rhythm.     Heart sounds: No murmur heard. Pulmonary:     Breath sounds: Normal breath sounds.  Musculoskeletal:     Cervical back: Neck supple.  Lymphadenopathy:     Cervical: No cervical adenopathy.  Skin:    Coloration: Skin is not jaundiced or pale.  Neurological:     General: No focal deficit present.     Mental Status: She is alert and oriented to person, place, and time.  Psychiatric:        Behavior: Behavior normal.      UC Treatments / Results  Labs (all labs ordered  are listed, but only abnormal results are displayed) Labs Reviewed  POCT URINE PREGNANCY    EKG   Radiology No results found.  Procedures Procedures (including critical care time)  Medications Ordered in UC Medications - No data to display  Initial Impression / Assessment and Plan / UC Course  I have reviewed the triage vital signs and the nursing notes.  Pertinent labs & imaging results that were available during my care of the patient were reviewed by me and considered in my medical decision making (see chart for details).        UPT is negative.  I asked if she was interested in seeing someone for birth control, and she stated that   Staff let me know after the patient had left that she was requesting testing for yeast infection.  Vaginal self swab was done and cytology is ordered on that.  We will notify her and treat per protocol any positives on the swab Final Clinical Impressions(s) / UC Diagnoses   Final diagnoses:  Delayed menses     Discharge Instructions      Your pregnancy test was negative here     ED Prescriptions   None    PDMP not reviewed this encounter.   Zenia Resides, MD 09/28/22 1000    Zenia Resides, MD 09/28/22 1007

## 2022-09-28 NOTE — ED Triage Notes (Signed)
Pt c/o [facility] last month on the 1st. States she has not had a cycle this month, requesting u-preg. States she had a recent pregnancy ("Let's say in march") and considered this pregnancy the cause of her irregular cycle this month but looking for assurance.

## 2022-09-29 LAB — CERVICOVAGINAL ANCILLARY ONLY
Bacterial Vaginitis (gardnerella): NEGATIVE
Candida Glabrata: POSITIVE — AB
Candida Vaginitis: POSITIVE — AB
Chlamydia: NEGATIVE
Comment: NEGATIVE
Comment: NEGATIVE
Comment: NEGATIVE
Comment: NEGATIVE
Comment: NEGATIVE
Comment: NORMAL
Neisseria Gonorrhea: NEGATIVE
Trichomonas: NEGATIVE

## 2022-09-30 ENCOUNTER — Telehealth (HOSPITAL_COMMUNITY): Payer: Self-pay | Admitting: Emergency Medicine

## 2022-09-30 MED ORDER — FLUCONAZOLE 150 MG PO TABS: 150.0000 mg | ORAL_TABLET | Freq: Once | ORAL | 0 refills | Status: AC

## 2022-10-23 ENCOUNTER — Ambulatory Visit
Admission: EM | Admit: 2022-10-23 | Discharge: 2022-10-23 | Disposition: A | Discharge status: Home / Self Care | Payer: Medicaid Other | Attending: Internal Medicine | Admitting: Internal Medicine

## 2022-10-23 DIAGNOSIS — N898 Other specified noninflammatory disorders of vagina: Secondary | ICD-10-CM | POA: Diagnosis not present

## 2022-10-23 MED ORDER — FLUCONAZOLE 150 MG PO TABS: 150.0000 mg | ORAL_TABLET | ORAL | 0 refills | Status: DC

## 2022-10-23 NOTE — Discharge Instructions (Signed)
Take diflucan once today, then again in 3 days to treat suspected vaginal yeast infection.   Will treat for all other positive results on swab when it comes back in 2-3 days.  Please follow-up with OB/GYN for ongoing discussion in preventing further vaginal yeast infections.  If you develop any new or worsening symptoms or do not improve in the next 2 to 3 days, please return.  If your symptoms are severe, please go to the emergency room.  Follow-up with your primary care provider for further evaluation and management of your symptoms as well as ongoing wellness visits.  I hope you feel better!

## 2022-10-23 NOTE — ED Triage Notes (Signed)
Patient with c/o possible yeast infection for about 3 days. Patient states she gets recurrent yeast infections.

## 2022-10-23 NOTE — ED Provider Notes (Signed)
[name]    CSN: [id number] Arrival date & time: 10/23/22  0955      History   Chief Complaint Chief Complaint  Patient presents with   Vaginitis    HPI Valerie Newman is a 22 y.o. female.   Patient presents to urgent care for evaluation of vaginal itching and vaginal discharge that started a few days ago.  Reports history of frequent vaginal yeast infections, was most recently treated for this last month.  Last menstrual cycle was October 08, 2022, denies chance of pregnancy.  Denies concern for STD or recent known exposure to STD.  No urinary symptoms, nausea, vomiting, abdominal pain, diarrhea, dizziness, fever/chills, or rash.  No recent antibiotic or steroid use.  She is not a diabetic and denies use of SGLT2 inhibitor.  She has been worked up for frequent vaginal yeast infections in the past by OB/GYN.      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   Allergy to pollen    Anemia    Anxiety    Asthma    Depression    post partum   Headache    Nausea and vomiting during pregnancy 07/21/2017   Ovarian cyst    UTI (urinary tract infection)     Patient Active Problem List   Diagnosis Date Noted   Infusion reaction 07/15/2021   Nausea and vomiting during pregnancy 07/21/2017    Past Surgical History:  Procedure Laterality Date   IUD INSERTION  11/17/2021   IUD REMOVAL  12/27/2021   NO PAST SURGERIES     TOOTH EXTRACTION      OB History     Gravida  3   Para  2   Term  2   Preterm  0   AB  0   Living  2      SAB  0   IAB  0   Ectopic  0   Multiple  0   Live Births  2            Home Medications    Prior to Admission medications   Medication Sig Start Date End Date Taking? Authorizing Provider  fluconazole (DIFLUCAN) 150 MG tablet Take 1 tablet (150 mg total) by mouth every 3 (three) days. 10/23/22  Yes Gladis Soley, Donavan Burnet, FNP    Family History Family History  Problem Relation Age  of Onset   Heart disease Mother    Stroke Mother    Healthy Father    Diabetes Maternal Aunt    Diabetes Maternal Uncle    Hypertension Maternal Grandmother    Diabetes Maternal Grandmother    Diabetes Maternal Grandfather     Social History Social History   Tobacco Use   Smoking status: Never    Passive exposure: Never   Smokeless tobacco: Never  Vaping Use   Vaping Use: Never used  Substance Use Topics   Alcohol use: No   Drug use: No     Allergies   Bee venom, Wasp venom, and Metronidazole   Review of Systems Review of Systems Per HPI  Physical Exam Triage Vital Signs ED Triage Vitals  Enc Vitals Group     BP 10/23/22 1019 128/82     Pulse Rate 10/23/22 1019 74     Resp 10/23/22 1019 16     Temp 10/23/22 1019 97.9 F (36.6 C)     Temp Source 10/23/22 1019 Oral  SpO2 10/23/22 1019 96 %     Weight --      Height --      Head Circumference --      Peak Flow --      Pain Score 10/23/22 1020 0     Pain Loc --      Pain Edu? --      Excl. in GC? --    No data found.  Updated Vital Signs BP 128/82 (BP Location: Left Arm)   Pulse 74   Temp 97.9 F (36.6 C) (Oral)   Resp 16   LMP 10/08/2022 (Approximate)   SpO2 96%   Visual Acuity Right Eye Distance:   Left Eye Distance:   Bilateral Distance:    Right Eye Near:   Left Eye Near:    Bilateral Near:     Physical Exam Vitals and nursing note reviewed.  Constitutional:      Appearance: She is not ill-appearing or toxic-appearing.  HENT:     Head: Normocephalic and atraumatic.     Right Ear: Hearing and external ear normal.     Left Ear: Hearing and external ear normal.     Nose: Nose normal.     Mouth/Throat:     Lips: Pink.  Eyes:     General: Lids are normal. Vision grossly intact. Gaze aligned appropriately.     Extraocular Movements: Extraocular movements intact.     Conjunctiva/sclera: Conjunctivae normal.  Pulmonary:     Effort: Pulmonary effort is normal.  Genitourinary:     Comments: Deferred. Musculoskeletal:     Cervical back: Neck supple.  Skin:    General: Skin is warm and dry.     Capillary Refill: Capillary refill takes less than 2 seconds.     Findings: No rash.  Neurological:     General: No focal deficit present.     Mental Status: She is alert and oriented to person, place, and time. Mental status is at baseline.     Cranial Nerves: No dysarthria or facial asymmetry.  Psychiatric:        Mood and Affect: Mood normal.        Speech: Speech normal.        Behavior: Behavior normal.        Thought Content: Thought content normal.        Judgment: Judgment normal.      UC Treatments / Results  Labs (all labs ordered are listed, but only abnormal results are displayed) Labs Reviewed  CERVICOVAGINAL ANCILLARY ONLY    EKG   Radiology No results found.  Procedures Procedures (including critical care time)  Medications Ordered in UC Medications - No data to display  Initial Impression / Assessment and Plan / UC Course  I have reviewed the triage vital signs and the nursing notes.  Pertinent labs & imaging results that were available during my care of the patient were reviewed by me and considered in my medical decision making (see chart for details).   1.  Vaginal itching STI labs pending, patient declines HIV and/or syphilis testing today. Will go ahead and start treatment for yeast vaginitis based on symptoms and history of same. Diflucan sent to pharmacy to be taken as prescribed. Will treat for all other positive results once STI labs result. Patient to abstain from sexual intercourse for 7 days while undergoing treatment. Education provided regarding safe sexual practices and patient encouraged to use protection to prevent spread of STIs.   Discussed physical exam  and available lab work findings in clinic with patient.  Counseled patient regarding appropriate use of medications and potential side effects for all medications  recommended or prescribed today. Discussed red flag signs and symptoms of worsening condition,when to call the PCP office, return to urgent care, and when to seek higher level of care in the emergency department. Patient verbalizes understanding and agreement with plan. All questions answered. Patient discharged in stable condition.    Final Clinical Impressions(s) / UC Diagnoses   Final diagnoses:  Vaginal itching     Discharge Instructions      Take diflucan once today, then again in 3 days to treat suspected vaginal yeast infection.   Will treat for all other positive results on swab when it comes back in 2-3 days.  Please follow-up with OB/GYN for ongoing discussion in preventing further vaginal yeast infections.  If you develop any new or worsening symptoms or do not improve in the next 2 to 3 days, please return.  If your symptoms are severe, please go to the emergency room.  Follow-up with your primary care provider for further evaluation and management of your symptoms as well as ongoing wellness visits.  I hope you feel better!    ED Prescriptions     Medication Sig Dispense Auth. Provider   fluconazole (DIFLUCAN) 150 MG tablet Take 1 tablet (150 mg total) by mouth every 3 (three) days. 2 tablet Carlisle Beers, FNP      PDMP not reviewed this encounter.   Carlisle Beers, [place] 10/23/22 1122

## 2022-10-26 LAB — CERVICOVAGINAL ANCILLARY ONLY
Bacterial Vaginitis (gardnerella): NEGATIVE
Candida Glabrata: POSITIVE — AB
Candida Vaginitis: POSITIVE — AB
Chlamydia: NEGATIVE
Comment: NEGATIVE
Comment: NEGATIVE
Comment: NEGATIVE
Comment: NEGATIVE
Comment: NEGATIVE
Comment: NORMAL
Neisseria Gonorrhea: NEGATIVE
Trichomonas: NEGATIVE

## 2022-11-04 ENCOUNTER — Ambulatory Visit: Payer: Medicaid Other | Admitting: Obstetrics and Gynecology

## 2022-11-24 ENCOUNTER — Ambulatory Visit
Admission: EM | Admit: 2022-11-24 | Discharge: 2022-11-24 | Disposition: A | Discharge status: Home / Self Care | Payer: Medicaid Other | Attending: Internal Medicine | Admitting: Internal Medicine

## 2022-11-24 DIAGNOSIS — Z113 Encounter for screening for infections with a predominantly sexual mode of transmission: Secondary | ICD-10-CM | POA: Diagnosis present

## 2022-11-24 DIAGNOSIS — N898 Other specified noninflammatory disorders of vagina: Secondary | ICD-10-CM | POA: Insufficient documentation

## 2022-11-24 LAB — POCT FASTING CBG KUC MANUAL ENTRY: POCT Glucose (KUC): 113 mg/dL — AB (ref 70–99)

## 2022-11-24 MED ORDER — FLUCONAZOLE 150 MG PO TABS: 150.0000 mg | ORAL_TABLET | ORAL | 0 refills | Status: DC

## 2022-11-24 NOTE — ED Provider Notes (Signed)
[name]    CSN: [id number] Arrival date & time: 11/24/22  1425      History   Chief Complaint Chief Complaint  Patient presents with   Vaginal Discharge    HPI Valerie Newman is a 22 y.o. female.   Patient presents with white vaginal discharge that started about 1 week ago.  Patient reports that she has recurrent yeast infections and this feels similar.  Denies any associated urinary symptoms or abnormal vaginal bleeding.  Last menstrual cycle was on 10/30/2022.  Patient denies exposure to STD but has had unprotected intercourse with a single sexual partner.   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   Allergy to pollen    Anemia    Anxiety    Asthma    Depression    post partum   Headache    Nausea and vomiting during pregnancy 07/21/2017   Ovarian cyst    UTI (urinary tract infection)     Patient Active Problem List   Diagnosis Date Noted   Infusion reaction 07/15/2021   Nausea and vomiting during pregnancy 07/21/2017    Past Surgical History:  Procedure Laterality Date   IUD INSERTION  11/17/2021   IUD REMOVAL  12/27/2021   NO PAST SURGERIES     TOOTH EXTRACTION      OB History     Gravida  3   Para  2   Term  2   Preterm  0   AB  0   Living  2      SAB  0   IAB  0   Ectopic  0   Multiple  0   Live Births  2            Home Medications    Prior to Admission medications   Medication Sig Start Date End Date Taking? Authorizing Provider  fluconazole (DIFLUCAN) 150 MG tablet Take 1 tablet (150 mg total) by mouth every 3 (three) days. 11/24/22  Yes Bristal Steffy, Acie Fredrickson, FNP    Family History Family History  Problem Relation Age of Onset   Heart disease Mother    Stroke Mother    Healthy Father    Diabetes Maternal Aunt    Diabetes Maternal Uncle    Hypertension Maternal Grandmother    Diabetes Maternal Grandmother    Diabetes Maternal Grandfather      Social History Social History   Tobacco Use   Smoking status: Never    Passive exposure: Never   Smokeless tobacco: Never  Vaping Use   Vaping Use: Never used  Substance Use Topics   Alcohol use: No   Drug use: No     Allergies   Bee venom, Wasp venom, and Metronidazole   Review of Systems Review of Systems Per HPI  Physical Exam Triage Vital Signs ED Triage Vitals  Enc Vitals Group     BP 11/24/22 1439 (!) 102/58     Pulse Rate 11/24/22 1523 75     Resp 11/24/22 1439 18     Temp 11/24/22 1439 99 F (37.2 C)     Temp Source 11/24/22 1439 Oral     SpO2 11/24/22 1439 94 %     Weight --      Height --      Head Circumference --      Peak Flow --      Pain Score --  Pain Loc --      Pain Edu? --      Excl. in GC? --    No data found.  Updated Vital Signs BP (!) 102/58 (BP Location: Left Arm)   Pulse 75   Temp 99 F (37.2 C) (Oral)   Resp 18   LMP 05/22/2022 (Approximate)   SpO2 99%   Visual Acuity Right Eye Distance:   Left Eye Distance:   Bilateral Distance:    Right Eye Near:   Left Eye Near:    Bilateral Near:     Physical Exam Constitutional:      General: She is not in acute distress.    Appearance: Normal appearance. She is not toxic-appearing or diaphoretic.  HENT:     Head: Normocephalic and atraumatic.  Eyes:     Extraocular Movements: Extraocular movements intact.     Conjunctiva/sclera: Conjunctivae normal.  Pulmonary:     Effort: Pulmonary effort is normal.  Genitourinary:    Comments: Deferred with shared decision making.  Self swab performed. Neurological:     General: No focal deficit present.     Mental Status: She is alert and oriented to person, place, and time. Mental status is at baseline.  Psychiatric:        Mood and Affect: Mood normal.        Behavior: Behavior normal.        Thought Content: Thought content normal.        Judgment: Judgment normal.      UC Treatments / Results  Labs (all labs  ordered are listed, but only abnormal results are displayed) Labs Reviewed  POCT FASTING CBG [facility] MANUAL ENTRY - Abnormal; Notable for the following components:      Result Value   POCT Glucose ([facility]) 113 (*)    All other components within normal limits  CERVICOVAGINAL ANCILLARY ONLY    EKG   Radiology No results found.  Procedures Procedures (including critical care time)  Medications Ordered in UC Medications - No data to display  Initial Impression / Assessment and Plan / UC Course  I have reviewed the triage vital signs and the nursing notes.  Pertinent labs & imaging results that were available during my care of the patient were reviewed by me and considered in my medical decision making (see chart for details).     Highly suspect vaginal yeast infection given patient has history of recurrent infections.  Therefore, will opt to treat with Diflucan.  Cervicovaginal swab pending.  UA deferred given no urinary symptoms.  CBG completed given recurrent yeast infections to be sure that diabetes was not a concern.  It was unremarkable.  Advised strict follow precautions.  Advised following up with gynecology at provided contact information given her recurrent yeast infections.  Patient verbalized understanding and was agreeable with plan. Final Clinical Impressions(s) / UC Diagnoses   Final diagnoses:  Vaginal discharge  Screening examination for venereal disease     Discharge Instructions      Blood glucose is not showing any obvious signs of diabetes.  Your vaginal swab is pending.  Will call if it is abnormal.  I have prescribed you Diflucan given suspicion of yeast infection.  Follow-up with gynecology.   ED Prescriptions     Medication Sig Dispense Auth. Provider   fluconazole (DIFLUCAN) 150 MG tablet Take 1 tablet (150 mg total) by mouth every 3 (three) days. 2 tablet Gustavus Bryant, [place]      PDMP not reviewed  this encounter.   Gustavus Bryant, [place] 11/24/22  1537

## 2022-11-24 NOTE — Discharge Instructions (Signed)
Blood glucose is not showing any obvious signs of diabetes.  Your vaginal swab is pending.  Will call if it is abnormal.  I have prescribed you Diflucan given suspicion of yeast infection.  Follow-up with gynecology.

## 2022-11-24 NOTE — ED Triage Notes (Signed)
Patient with c/o vaginal discharge x 1 week. Pt states this is recurrent vaginal discharge.

## 2022-11-25 LAB — CERVICOVAGINAL ANCILLARY ONLY
Bacterial Vaginitis (gardnerella): NEGATIVE
Candida Glabrata: POSITIVE — AB
Candida Vaginitis: POSITIVE — AB
Chlamydia: NEGATIVE
Comment: NEGATIVE
Comment: NEGATIVE
Comment: NEGATIVE
Comment: NEGATIVE
Comment: NEGATIVE
Comment: NORMAL
Neisseria Gonorrhea: NEGATIVE
Trichomonas: NEGATIVE

## 2022-11-26 ENCOUNTER — Telehealth (HOSPITAL_COMMUNITY): Payer: Self-pay | Admitting: Emergency Medicine

## 2022-11-26 MED ORDER — FLUCONAZOLE 150 MG PO TABS: 150.0000 mg | ORAL_TABLET | ORAL | 0 refills | Status: DC

## 2022-12-05 ENCOUNTER — Ambulatory Visit
Admission: EM | Admit: 2022-12-05 | Discharge: 2022-12-05 | Disposition: A | Discharge status: Home / Self Care | Payer: Medicaid Other | Attending: Emergency Medicine | Admitting: Emergency Medicine

## 2022-12-05 ENCOUNTER — Ambulatory Visit (INDEPENDENT_AMBULATORY_CARE_PROVIDER_SITE_OTHER): Payer: Medicaid Other

## 2022-12-05 DIAGNOSIS — S6991XA Unspecified injury of right wrist, hand and finger(s), initial encounter: Secondary | ICD-10-CM | POA: Diagnosis not present

## 2022-12-05 DIAGNOSIS — M79641 Pain in right hand: Secondary | ICD-10-CM

## 2022-12-05 MED ORDER — IBUPROFEN 800 MG PO TABS
800.0000 mg | ORAL_TABLET | Freq: Once | ORAL | Status: AC
  Administered 2022-12-05: 800 mg via ORAL
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
## 2022-12-15 ENCOUNTER — Encounter: Payer: Self-pay | Admitting: Certified Nurse Midwife

## 2022-12-16 ENCOUNTER — Ambulatory Visit (INDEPENDENT_AMBULATORY_CARE_PROVIDER_SITE_OTHER): Payer: MEDICAID | Admitting: Certified Nurse Midwife

## 2022-12-16 ENCOUNTER — Encounter: Payer: Self-pay | Admitting: Certified Nurse Midwife

## 2022-12-16 ENCOUNTER — Other Ambulatory Visit (HOSPITAL_COMMUNITY)
Admission: RE | Admit: 2022-12-16 | Discharge: 2022-12-16 | Disposition: A | Discharge status: Home / Self Care | Payer: MEDICAID | Source: Ambulatory Visit | Attending: Obstetrics and Gynecology | Admitting: Obstetrics and Gynecology

## 2022-12-16 ENCOUNTER — Other Ambulatory Visit: Payer: Self-pay

## 2022-12-16 VITALS — BP 108/66 | HR 73

## 2022-12-16 DIAGNOSIS — Z01419 Encounter for gynecological examination (general) (routine) without abnormal findings: Secondary | ICD-10-CM

## 2022-12-16 DIAGNOSIS — N898 Other specified noninflammatory disorders of vagina: Secondary | ICD-10-CM

## 2022-12-16 DIAGNOSIS — R350 Frequency of micturition: Secondary | ICD-10-CM | POA: Diagnosis not present

## 2022-12-16 LAB — POCT URINALYSIS DIP (DEVICE)
Bilirubin Urine: NEGATIVE
Glucose, UA: NEGATIVE mg/dL
Ketones, ur: NEGATIVE mg/dL
Leukocytes,Ua: NEGATIVE
Nitrite: NEGATIVE
Protein, ur: NEGATIVE mg/dL
Specific Gravity, Urine: >=1.03 (ref 1.005–1.030)
Urobilinogen, UA: 0.2 mg/dL (ref 0.0–1.0)
pH: 6 (ref 5.0–8.0)

## 2022-12-16 NOTE — Progress Notes (Signed)
ANNUAL EXAM Patient name: Valerie Newman MRN [id number]  Date of birth: 2000/11/28 Chief Complaint:   Gynecologic Exam (Urinary frequency, multiple yeast infections no matter what she does at least one a month.)  History of Present Illness:   Valerie Newman is a 22 y.o. [phone] African-American female being seen today for a routine annual exam.  Current complaints: Recurrent yeast, typically after her period. No other concerns. Declines hormonal birth control.  Patient's last menstrual period was 11/27/2022 (approximate).   Upstream - 12/17/22 1311       Pregnancy Intention Screening   Does the patient want to become pregnant in the next year? No    Does the patient's partner want to become pregnant in the next year? N/A    Would the patient like to discuss contraceptive options today? No      Contraception Wrap Up   Current Method Abstinence;Female Condom    End Method Abstinence;Female Condom    Contraception Counseling Provided Yes    How was the end contraceptive method provided? N/A            The pregnancy intention screening data noted above was reviewed. Potential methods of contraception were discussed. The patient elected to proceed with Abstinence; Female Condom.   Last pap 2023. Results were: NILM w/ HRHPV negative. H/O abnormal pap: no Last mammogram: Never (age). Results were: N/A. Family h/o breast cancer: no Last colonoscopy: Never (age). Results were: N/A. Family h/o colorectal cancer: no     12/26/2021   10:58 AM 09/24/2021    2:30 PM 09/17/2021    9:01 AM 07/02/2021    8:33 AM 03/26/2021    1:27 PM  Depression screen PHQ 2/9  Decreased Interest 1 0 0 0 2  Down, Depressed, Hopeless 1 0 0 0 1  PHQ - 2 Score 2 0 0 0 3  Altered sleeping 0 0 0 0 0  Tired, decreased energy 0 0 0 1 0  Change in appetite 0 0 0 0 0  Feeling bad or failure about yourself  0 0 0 0 2  Trouble concentrating 0 0 0 0 0  Moving slowly or fidgety/restless 0 0 0 0 0  Suicidal thoughts 0  0 0 0 0  PHQ-9 Score 2 0 0 1 5  Difficult doing work/chores    Not difficult at all Somewhat difficult        12/26/2021   10:58 AM 09/24/2021    2:31 PM 09/17/2021    9:01 AM 07/02/2021    8:33 AM  GAD 7 : Generalized Anxiety Score  Nervous, Anxious, on Edge 0 0 0 0  Control/stop worrying 1 0 0 0  Worry too much - different things 1 0 0 0  Trouble relaxing 1 0 0 0  Restless 0 0 0 0  Easily annoyed or irritable 2 0 0 0  Afraid - awful might happen 0 0 0 0  Total GAD 7 Score 5 0 0 0  Anxiety Difficulty    Not difficult at all     Review of Systems:   Pertinent items are noted in HPI Denies any headaches, blurred vision, fatigue, shortness of breath, chest pain, abdominal pain, abnormal vaginal discharge/itching/odor/irritation, problems with periods, bowel movements, urination, or intercourse unless otherwise stated above. Pertinent History Reviewed:  Reviewed past medical,surgical, social and family history.  Reviewed problem list, medications and allergies. Physical Assessment:   Vitals:   12/16/22 1547  BP: 108/66  Pulse: 73   There  is no height or weight on file to calculate BMI.   Constitutional: Well-developed, well-nourished pregnant female in no acute distress.  HEENT: PERRLA Skin: normal color and turgor, no rash Cardiovascular: normal rate & rhythm, warm and well perfused Respiratory: normal effort, no problems with respiration noted GI: Abd soft, non-tender, non-distended MS: Extremities nontender, no edema, normal ROM Neurologic: Alert and oriented x 4.  GU: no CVA tenderness Pelvic: exam deferred, self-swabs collected  Results for orders placed or performed in visit on 12/16/22 (from the past 24 hour(s))  POCT urinalysis dip (device)   Collection Time: 12/16/22  4:23 PM  Result Value Ref Range   Glucose, UA NEGATIVE NEGATIVE mg/dL   Bilirubin Urine NEGATIVE NEGATIVE   Ketones, ur NEGATIVE NEGATIVE mg/dL   Specific Gravity, Urine >=1.030 1.005 - 1.030    Hgb urine dipstick TRACE (A) NEGATIVE   pH 6.0 5.0 - 8.0   Protein, ur NEGATIVE NEGATIVE mg/dL   Urobilinogen, UA 0.2 0.0 - 1.0 mg/dL   Nitrite NEGATIVE NEGATIVE   Leukocytes,Ua NEGATIVE NEGATIVE    Assessment & Plan:  1. Encounter for annual routine gynecological examination - Doing well overall  2. Urinary frequency - No urgency or dysuria - POCT urinalysis dipstick  3. Vaginal irritation - Feels she has a yeast infection again, gets one monthly - Uses pads, discussed switching to tampons or other method that does not keep moisture up near her vulva. Also suggested she try cotton pads since they are more breathable, and washing all underwear/pajamas on hot with vinegar added to the wash. Pt agreed to try. - If positive for yeast, will treat with a longer course of diflucan - Cervicovaginal ancillary only( [place])  Routine preventative health maintenance measures emphasized.  Mammogram: @ 23yo, or sooner if problems Colonoscopy: @ 22yo, or sooner if problems  Orders Placed This Encounter  Procedures   POCT urinalysis dipstick   POCT urinalysis dip (device)   Meds: No orders of the defined types were placed in this encounter.  Follow-up: Return in about 1 year (around 12/16/2023), or if symptoms worsen or fail to improve.  Bernerd Limbo, CNM 12/17/2022 1:17 PM

## 2022-12-18 LAB — CERVICOVAGINAL ANCILLARY ONLY
Bacterial Vaginitis (gardnerella): NEGATIVE
Candida Glabrata: POSITIVE — AB
Candida Vaginitis: NEGATIVE
Chlamydia: NEGATIVE
Comment: NEGATIVE
Comment: NEGATIVE
Comment: NEGATIVE
Comment: NEGATIVE
Comment: NEGATIVE
Comment: NORMAL
Neisseria Gonorrhea: NEGATIVE
Trichomonas: NEGATIVE

## 2023-01-08 ENCOUNTER — Ambulatory Visit
Admission: EM | Admit: 2023-01-08 | Discharge: 2023-01-08 | Disposition: A | Discharge status: Home / Self Care | Payer: MEDICAID | Attending: Physician Assistant | Admitting: Physician Assistant

## 2023-01-08 ENCOUNTER — Telehealth: Payer: Self-pay | Admitting: Family Medicine

## 2023-01-08 DIAGNOSIS — B3731 Acute candidiasis of vulva and vagina: Secondary | ICD-10-CM | POA: Diagnosis not present

## 2023-01-08 MED ORDER — FLUCONAZOLE 150 MG PO TABS: 150.0000 mg | ORAL_TABLET | Freq: Every day | ORAL | 1 refills | Status: DC

## 2023-01-08 NOTE — ED Provider Notes (Signed)
[name]    CSN: [id number] Arrival date & time: 01/08/23  1134      History   Chief Complaint Chief Complaint  Patient presents with   Vaginal Discharge    HPI Valerie Newman is a 22 y.o. female.   Pt complains of vaginal irritation.  Pt reports she has a hisroty of yeast infections and this feels like yeast.   The history is provided by the patient. No language interpreter was used.  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   Allergy to pollen    Anemia    Anxiety    Asthma    Depression    post partum   Headache    Nausea and vomiting during pregnancy 07/21/2017   Ovarian cyst    UTI (urinary tract infection)     Patient Active Problem List   Diagnosis Date Noted   Infusion reaction 07/15/2021    Past Surgical History:  Procedure Laterality Date   IUD INSERTION  11/17/2021   IUD REMOVAL  12/27/2021   NO PAST SURGERIES     TOOTH EXTRACTION      OB History     Gravida  3   Para  2   Term  2   Preterm  0   AB  1   Living  2      SAB  0   IAB  1   Ectopic  0   Multiple  0   Live Births  2            Home Medications    Prior to Admission medications   Medication Sig Start Date End Date Taking? Authorizing Provider  fluconazole (DIFLUCAN) 150 MG tablet Take 1 tablet (150 mg total) by mouth daily. 01/08/23  Yes Elson Areas, PA-C    Family History Family History  Problem Relation Age of Onset   Heart disease Mother    Stroke Mother    Healthy Father    Diabetes Maternal Aunt    Diabetes Maternal Uncle    Hypertension Maternal Grandmother    Diabetes Maternal Grandmother    Diabetes Maternal Grandfather     Social History Social History   Tobacco Use    Smoking status: Never    Passive exposure: Never   Smokeless tobacco: Never  Vaping Use   Vaping status: Never Used  Substance Use Topics   Alcohol use: No   Drug use: No     Allergies   Bee venom, Wasp venom, and Metronidazole   Review of Systems Review of Systems  Genitourinary:  Positive for vaginal discharge.  All other systems reviewed and are negative.    Physical Exam Triage Vital Signs ED Triage Vitals [01/08/23 1142]  Encounter Vitals Group     BP 97/64     Systolic BP Percentile      Diastolic BP Percentile      Pulse Rate 88     Resp 16     Temp 98.1 F (36.7 C)     Temp Source Oral     SpO2 98 %     Weight      Height      Head Circumference      Peak Flow  Pain Score      Pain Loc      Pain Education      Exclude from Growth Chart    No data found.  Updated Vital Signs BP 97/64 (BP Location: Left Arm)   Pulse 88   Temp 98.1 F (36.7 C) (Oral)   Resp 16   LMP 12/16/2022   SpO2 98%   Visual Acuity Right Eye Distance:   Left Eye Distance:   Bilateral Distance:    Right Eye Near:   Left Eye Near:    Bilateral Near:     Physical Exam Vitals reviewed.  Constitutional:      Appearance: Normal appearance.  Cardiovascular:     Rate and Rhythm: Normal rate.  Pulmonary:     Effort: Pulmonary effort is normal.  Skin:    General: Skin is warm.  Neurological:     General: No focal deficit present.     Mental Status: She is alert.  Psychiatric:        Mood and Affect: Mood normal.      UC Treatments / Results  Labs (all labs ordered are listed, but only abnormal results are displayed) Labs Reviewed  CERVICOVAGINAL ANCILLARY ONLY    EKG   Radiology No results found.  Procedures Procedures (including critical care time)  Medications Ordered in UC Medications - No data to display  Initial Impression / Assessment and Plan / UC Course  I have reviewed the triage vital signs and the nursing notes.  Pertinent labs &  imaging results that were available during my care of the patient were reviewed by me and considered in my medical decision making (see chart for details).     Vaginal swab pending  Final Clinical Impressions(s) / UC Diagnoses   Final diagnoses:  Yeast vaginitis   Discharge Instructions   None    ED Prescriptions     Medication Sig Dispense Auth. Provider   fluconazole (DIFLUCAN) 150 MG tablet Take 1 tablet (150 mg total) by mouth daily. 1 tablet Elson Areas, [place]      PDMP not reviewed this encounter. An After Visit Summary was printed and given to the patient.       Elson Areas, [place] 01/08/23 1206

## 2023-01-08 NOTE — Telephone Encounter (Signed)
Recurrent yeacrsst

## 2023-01-08 NOTE — ED Triage Notes (Signed)
Vaginal discharge started 2-3 days. Pt reports this is ongoing issue.

## 2023-01-14 ENCOUNTER — Ambulatory Visit
Admission: EM | Admit: 2023-01-14 | Discharge: 2023-01-14 | Disposition: A | Discharge status: Home / Self Care | Payer: MEDICAID | Attending: Internal Medicine | Admitting: Internal Medicine

## 2023-01-14 DIAGNOSIS — B3731 Acute candidiasis of vulva and vagina: Secondary | ICD-10-CM | POA: Diagnosis present

## 2023-01-14 DIAGNOSIS — R300 Dysuria: Secondary | ICD-10-CM | POA: Diagnosis not present

## 2023-01-14 DIAGNOSIS — N898 Other specified noninflammatory disorders of vagina: Secondary | ICD-10-CM | POA: Insufficient documentation

## 2023-01-14 DIAGNOSIS — Z113 Encounter for screening for infections with a predominantly sexual mode of transmission: Secondary | ICD-10-CM | POA: Insufficient documentation

## 2023-01-14 LAB — POCT URINALYSIS DIP (MANUAL ENTRY)
Bilirubin, UA: NEGATIVE
Blood, UA: NEGATIVE
Glucose, UA: NEGATIVE mg/dL
Ketones, POC UA: NEGATIVE mg/dL
Leukocytes, UA: NEGATIVE
Nitrite, UA: NEGATIVE
Protein Ur, POC: 30 mg/dL — AB
Spec Grav, UA: 1.025 (ref 1.010–1.025)
Urobilinogen, UA: 1 E.U./dL
pH, UA: 7 (ref 5.0–8.0)

## 2023-01-14 LAB — POCT URINE PREGNANCY: Preg Test, Ur: NEGATIVE

## 2023-01-14 MED ORDER — FLUCONAZOLE 150 MG PO TABS: 150.0000 mg | ORAL_TABLET | ORAL | 0 refills | Status: DC

## 2023-01-14 NOTE — ED Triage Notes (Addendum)
Pt c/o vaginal discomfort, pt states she was seen the other day for a yeast infection ws treated, but is stil having irritation and abnormal discharge. Urinary frequency, urinary urgency, urinary retention

## 2023-01-14 NOTE — ED Provider Notes (Signed)
[name]    CSN: [id number] Arrival date & time: 01/14/23  1851      History   Chief Complaint No chief complaint on file.   HPI Valerie Newman is a 22 y.o. female.   Patient presents with persistent vaginal discharge and discomfort.  She has also been having some dysuria and urinary frequency.  Last menstrual cycle was at the beginning of July.  Patient was recently seen on 7/26 and diagnosed with vaginal yeast infection.  She was treated with 1 pill of Diflucan which she took with no improvement in symptoms.  Reports that she has not been sexually active so has no concern for STD.     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   Allergy to pollen    Anemia    Anxiety    Asthma    Depression    post partum   Headache    Nausea and vomiting during pregnancy 07/21/2017   Ovarian cyst    UTI (urinary tract infection)     Patient Active Problem List   Diagnosis Date Noted   Infusion reaction 07/15/2021    Past Surgical History:  Procedure Laterality Date   IUD INSERTION  11/17/2021   IUD REMOVAL  12/27/2021   NO PAST SURGERIES     TOOTH EXTRACTION      OB History     Gravida  3   Para  2   Term  2   Preterm  0   AB  1   Living  2      SAB  0   IAB  1   Ectopic  0   Multiple  0   Live Births  2            Home Medications    Prior to Admission medications   Medication Sig Start Date End Date Taking? Authorizing Provider  fluconazole (DIFLUCAN) 150 MG tablet Take 1 tablet (150 mg total) by mouth every 3 (three) days. 01/14/23  Yes Graysin Luczynski, Acie Fredrickson, FNP    Family History Family History  Problem Relation Age of Onset   Heart disease Mother    Stroke Mother    Healthy Father    Diabetes Maternal Aunt    Diabetes Maternal Uncle    Hypertension Maternal Grandmother    Diabetes Maternal Grandmother    Diabetes Maternal Grandfather     Social History Social History   Tobacco Use    Smoking status: Never    Passive exposure: Never   Smokeless tobacco: Never  Vaping Use   Vaping status: Never Used  Substance Use Topics   Alcohol use: No   Drug use: No     Allergies   Bee venom, Wasp venom, and Metronidazole   Review of Systems Review of Systems Per HPI  Physical Exam Triage Vital Signs ED Triage Vitals  Encounter Vitals Group     BP 01/14/23 1912 113/71     Systolic BP Percentile --      Diastolic BP Percentile --      Pulse Rate 01/14/23 1912 71     Resp 01/14/23 1912 12     Temp 01/14/23 1912 98.1 F (36.7 C)     Temp Source 01/14/23 1912 Oral     SpO2 01/14/23 1912 98 %     Weight --      Height --      Head Circumference --  Peak Flow --      Pain Score 01/14/23 1918 0     Pain Loc --      Pain Education --      Exclude from Growth Chart --    No data found.  Updated Vital Signs BP 113/71 (BP Location: Left Arm)   Pulse 71   Temp 98.1 F (36.7 C) (Oral)   Resp 12   LMP 12/23/2022 (Exact Date)   SpO2 98%   Breastfeeding No   Visual Acuity Right Eye Distance:   Left Eye Distance:   Bilateral Distance:    Right Eye Near:   Left Eye Near:    Bilateral Near:     Physical Exam Constitutional:      General: She is not in acute distress.    Appearance: Normal appearance. She is not toxic-appearing or diaphoretic.  HENT:     Head: Normocephalic and atraumatic.  Eyes:     Extraocular Movements: Extraocular movements intact.     Conjunctiva/sclera: Conjunctivae normal.  Pulmonary:     Effort: Pulmonary effort is normal.  Genitourinary:    Comments: Deferred with shared decision making.  Self swab performed. Neurological:     General: No focal deficit present.     Mental Status: She is alert and oriented to person, place, and time. Mental status is at baseline.  Psychiatric:        Mood and Affect: Mood normal.        Behavior: Behavior normal.        Thought Content: Thought content normal.        Judgment: Judgment  normal.      UC Treatments / Results  Labs (all labs ordered are listed, but only abnormal results are displayed) Labs Reviewed  POCT URINALYSIS DIP (MANUAL ENTRY) - Abnormal; Notable for the following components:      Result Value   Protein Ur, POC =30 (*)    All other components within normal limits  POCT URINE PREGNANCY  CERVICOVAGINAL ANCILLARY ONLY    EKG   Radiology No results found.  Procedures Procedures (including critical care time)  Medications Ordered in UC Medications - No data to display  Initial Impression / Assessment and Plan / UC Course  I have reviewed the triage vital signs and the nursing notes.  Pertinent labs & imaging results that were available during my care of the patient were reviewed by me and considered in my medical decision making (see chart for details).     I suspect that patient's vaginal yeast infection has not been adequately treated.  Will send 2 more pills of Diflucan to see if this will be helpful.  Vaginal swab pending to confirm that this is still etiology of patient's symptoms.  UA unremarkable.  Urine pregnancy test negative.  Advised to follow-up with gynecology as well given that vaginal yeast infections appear to be very persistent and recurrent.  Patient verbalized understanding and was agreeable with plan. Final Clinical Impressions(s) / UC Diagnoses   Final diagnoses:  Vaginal candidiasis  Vaginal discharge  Dysuria  Screening examination for venereal disease   Discharge Instructions   None    ED Prescriptions     Medication Sig Dispense Auth. Provider   fluconazole (DIFLUCAN) 150 MG tablet Take 1 tablet (150 mg total) by mouth every 3 (three) days. 2 tablet [place], Acie Fredrickson, [place]      PDMP not reviewed this encounter.   Gustavus Bryant, [place] 01/14/23 [phone]

## 2023-02-11 ENCOUNTER — Ambulatory Visit
Admission: EM | Admit: 2023-02-11 | Discharge: 2023-02-11 | Disposition: A | Discharge status: Home / Self Care | Payer: MEDICAID | Attending: Internal Medicine | Admitting: Internal Medicine

## 2023-02-11 DIAGNOSIS — N898 Other specified noninflammatory disorders of vagina: Secondary | ICD-10-CM | POA: Diagnosis not present

## 2023-02-11 DIAGNOSIS — Z113 Encounter for screening for infections with a predominantly sexual mode of transmission: Secondary | ICD-10-CM | POA: Insufficient documentation

## 2023-02-11 NOTE — ED Provider Notes (Signed)
[name]    CSN: [id number] Arrival date & time: 02/11/23  1022      History   Chief Complaint Chief Complaint  Patient presents with   Vaginal Discharge    HPI Valerie Newman is a 22 y.o. female.   Patient presents with a white vaginal discharge that started a few days ago.  Patient is concerned for recurrent yeast infection.  Patient was seen in July and beginning of August and treated for vaginal yeast infection with Diflucan.  Reports that symptoms resolved, and these are new symptoms.  Patient states that she has refrained from sexual activity to see if this will be helpful and has been taking a vaginal probiotic.  She has not yet followed up with gynecology as she reports that she is not able to get an appointment until October.  Denies any exposure to STD.  Last menstrual cycle was 01/20/2023.   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   Allergy to pollen    Anemia    Anxiety    Asthma    Depression    post partum   Headache    Nausea and vomiting during pregnancy 07/21/2017   Ovarian cyst    UTI (urinary tract infection)     Patient Active Problem List   Diagnosis Date Noted   Infusion reaction 07/15/2021    Past Surgical History:  Procedure Laterality Date   IUD INSERTION  11/17/2021   IUD REMOVAL  12/27/2021   NO PAST SURGERIES     TOOTH EXTRACTION      OB History     Gravida  3   Para  2   Term  2   Preterm  0   AB  1   Living  2      SAB  0   IAB  1   Ectopic  0   Multiple  0   Live Births  2            Home Medications    Prior to Admission medications   Medication Sig Start Date End Date Taking? Authorizing Provider  fluconazole (DIFLUCAN) 150 MG tablet Take 1 tablet (150 mg total) by mouth every 3 (three) days. 01/14/23  Yes Dalyn Kjos, Acie Fredrickson, FNP    Family History Family History  Problem Relation Age of Onset   Heart disease Mother    Stroke  Mother    Healthy Father    Diabetes Maternal Aunt    Diabetes Maternal Uncle    Hypertension Maternal Grandmother    Diabetes Maternal Grandmother    Diabetes Maternal Grandfather     Social History Social History   Tobacco Use   Smoking status: Never    Passive exposure: Never   Smokeless tobacco: Never  Vaping Use   Vaping status: Never Used  Substance Use Topics   Alcohol use: No   Drug use: No     Allergies   Bee venom, Wasp venom, and Metronidazole   Review of Systems Review of Systems Per HPI  Physical Exam Triage Vital Signs ED Triage Vitals [02/11/23 1037]  Encounter Vitals Group     BP 97/64     Systolic BP Percentile      Diastolic BP Percentile      Pulse Rate 85     Resp 16     Temp 98.1 F (36.7 C)     Temp  Source Oral     SpO2 98 %     Weight      Height      Head Circumference      Peak Flow      Pain Score      Pain Loc      Pain Education      Exclude from Growth Chart    No data found.  Updated Vital Signs BP 97/64 (BP Location: Left Arm)   Pulse 85   Temp 98.1 F (36.7 C) (Oral)   Resp 16   LMP 01/23/2023 (Exact Date)   SpO2 98%   Visual Acuity Right Eye Distance:   Left Eye Distance:   Bilateral Distance:    Right Eye Near:   Left Eye Near:    Bilateral Near:     Physical Exam Constitutional:      General: She is not in acute distress.    Appearance: Normal appearance. She is not toxic-appearing or diaphoretic.  HENT:     Head: Normocephalic and atraumatic.  Eyes:     Extraocular Movements: Extraocular movements intact.     Conjunctiva/sclera: Conjunctivae normal.  Pulmonary:     Effort: Pulmonary effort is normal.  Genitourinary:    Comments: Deferred with shared decision making. Self swab performed.  Neurological:     General: No focal deficit present.     Mental Status: She is alert and oriented to person, place, and time. Mental status is at baseline.  Psychiatric:        Mood and Affect: Mood normal.         Behavior: Behavior normal.        Thought Content: Thought content normal.        Judgment: Judgment normal.      UC Treatments / Results  Labs (all labs ordered are listed, but only abnormal results are displayed) Labs Reviewed  CERVICOVAGINAL ANCILLARY ONLY    EKG   Radiology No results found.  Procedures Procedures (including critical care time)  Medications Ordered in UC Medications - No data to display  Initial Impression / Assessment and Plan / UC Course  I have reviewed the triage vital signs and the nursing notes.  Pertinent labs & imaging results that were available during my care of the patient were reviewed by me and considered in my medical decision making (see chart for details).     Patient's most recent vaginal swab in early August was also positive for Candida glabrata so I am concerned that this could be causing patient's recurrent yeast infections.  Patient advised to take boric acid suppositories nightly for 2 to 3 weeks to see if this will be helpful.  Will send cervicovaginal swab to confirm.  Will await results for any further treatment.  Patient advised to continue to keep appointment with gynecology for follow-up.  Patient verbalized understanding and was agreeable with plan. Final Clinical Impressions(s) / UC Diagnoses   Final diagnoses:  Vaginal discharge  Screening examination for venereal disease     Discharge Instructions      Please get 600 mg boric acid vaginal suppositories and use nightly for 2 to 3 weeks.    ED Prescriptions   None    PDMP not reviewed this encounter.   Gustavus Bryant, [place] 02/11/23 1118

## 2023-02-11 NOTE — ED Triage Notes (Signed)
Pt c/o vaginal discharge. Pt thinks she has a yeast infection.

## 2023-02-11 NOTE — Discharge Instructions (Signed)
Please get 600 mg boric acid vaginal suppositories and use nightly for 2 to 3 weeks.

## 2023-02-13 ENCOUNTER — Telehealth: Payer: Self-pay

## 2023-02-13 MED ORDER — FLUCONAZOLE 150 MG PO TABS: 150.0000 mg | ORAL_TABLET | ORAL | 0 refills | Status: AC

## 2023-02-13 NOTE — Telephone Encounter (Signed)
Per Helaine Chess, PA-C, "She can definitely continue using boric acid 600 mg vaginal suppository nightly for 2-3 weeks. 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

## 2023-02-18 ENCOUNTER — Other Ambulatory Visit: Payer: Self-pay

## 2023-02-18 ENCOUNTER — Emergency Department (HOSPITAL_COMMUNITY)
Admission: EM | Admit: 2023-02-18 | Discharge: 2023-02-18 | Disposition: A | Discharge status: Home / Self Care | Payer: MEDICAID | Attending: Emergency Medicine | Admitting: Emergency Medicine

## 2023-02-18 ENCOUNTER — Encounter (HOSPITAL_COMMUNITY): Payer: Self-pay

## 2023-02-18 DIAGNOSIS — D72829 Elevated white blood cell count, unspecified: Secondary | ICD-10-CM | POA: Insufficient documentation

## 2023-02-18 DIAGNOSIS — R110 Nausea: Secondary | ICD-10-CM | POA: Insufficient documentation

## 2023-02-18 DIAGNOSIS — R519 Headache, unspecified: Secondary | ICD-10-CM | POA: Insufficient documentation

## 2023-02-18 LAB — URINALYSIS, ROUTINE W REFLEX MICROSCOPIC
Bacteria, UA: NONE SEEN
Bilirubin Urine: NEGATIVE
Glucose, UA: NEGATIVE mg/dL
Ketones, ur: NEGATIVE mg/dL
Leukocytes,Ua: NEGATIVE
Nitrite: NEGATIVE
Protein, ur: NEGATIVE mg/dL
Specific Gravity, Urine: 1.024 (ref 1.005–1.030)
pH: 6 (ref 5.0–8.0)

## 2023-02-18 LAB — COMPREHENSIVE METABOLIC PANEL
ALT: 11 U/L (ref 0–44)
AST: 20 U/L (ref 15–41)
Albumin: 4.2 g/dL (ref 3.5–5.0)
Alkaline Phosphatase: 46 U/L (ref 38–126)
Anion gap: 10 (ref 5–15)
BUN: 15 mg/dL (ref 6–20)
CO2: 24 mmol/L (ref 22–32)
Calcium: 9 mg/dL (ref 8.9–10.3)
Chloride: 104 mmol/L (ref 98–111)
Creatinine, Ser: 0.8 mg/dL (ref 0.44–1.00)
GFR, Estimated: >60 mL/min (ref >60)
Glucose, Bld: 100 mg/dL — ABNORMAL HIGH (ref 70–99)
Potassium: 3.5 mmol/L (ref 3.5–5.1)
Sodium: 138 mmol/L (ref 135–145)
Total Bilirubin: 0.6 mg/dL (ref 0.3–1.2)
Total Protein: 7.9 g/dL (ref 6.5–8.1)

## 2023-02-18 LAB — CERVICOVAGINAL ANCILLARY ONLY
Bacterial Vaginitis (gardnerella): NEGATIVE
Candida Glabrata: POSITIVE — AB
Candida Vaginitis: POSITIVE — AB
Chlamydia: NEGATIVE
Comment: NEGATIVE
Comment: NEGATIVE
Comment: NEGATIVE
Comment: NEGATIVE
Comment: NEGATIVE
Comment: NORMAL
Neisseria Gonorrhea: NEGATIVE
Trichomonas: NEGATIVE

## 2023-02-18 LAB — LIPASE, BLOOD: Lipase: 24 U/L (ref 11–51)

## 2023-02-18 LAB — CBC
HCT: 35.2 % — ABNORMAL LOW (ref 36.0–46.0)
Hemoglobin: 11 g/dL — ABNORMAL LOW (ref 12.0–15.0)
MCH: 26.5 pg (ref 26.0–34.0)
MCHC: 31.3 g/dL (ref 30.0–36.0)
MCV: 84.8 fL (ref 80.0–100.0)
Platelets: 233 10*3/uL (ref 150–400)
RBC: 4.15 MIL/uL (ref 3.87–5.11)
RDW: 14 % (ref 11.5–15.5)
WBC: 4.4 10*3/uL (ref 4.0–10.5)
nRBC: 0 % (ref 0.0–0.2)

## 2023-02-18 LAB — HCG, SERUM, QUALITATIVE: Preg, Serum: NEGATIVE

## 2023-02-18 MED ORDER — DIPHENHYDRAMINE HCL 50 MG/ML IJ SOLN
12.5000 mg | Freq: Once | INTRAMUSCULAR | Status: AC
  Administered 2023-02-18: 12.5 mg via INTRAVENOUS
  Filled 2023-02-18: qty 1

## 2023-02-18 MED ORDER — PROCHLORPERAZINE EDISYLATE 10 MG/2ML IJ SOLN
10.0000 mg | Freq: Once | INTRAMUSCULAR | Status: AC
  Administered 2023-02-18: 10 mg via INTRAVENOUS
  Filled 2023-02-18: qty 2

## 2023-02-18 MED ORDER — ONDANSETRON 4 MG PO TBDP: 4.0000 mg | ORAL_TABLET | Freq: Three times a day (TID) | ORAL | 0 refills | Status: DC | PRN

## 2023-02-18 MED ORDER — SODIUM CHLORIDE 0.9 % IV BOLUS
1000.0000 mL | Freq: Once | INTRAVENOUS | Status: AC
  Administered 2023-02-18: 1000 mL via INTRAVENOUS

## 2023-02-18 NOTE — ED Notes (Signed)
Pt started yelling, saying she needs to leave NOW because she has anxiety, and demanded IV be taken out. IV removed. Pt stood up and became drowsy and needed assistance back to bed. Pt now sleeping.

## 2023-02-18 NOTE — ED Provider Notes (Signed)
[place] [facility] AT [facility] Provider Note   CSN: [id number] Arrival date & time: 02/18/23  1058    History  Chief Complaint  Patient presents with   Nausea    Valerie Newman is a 22 y.o. female for evaluation of headache and nausea.  Patient states she has had intermittent nausea over the last week.  No emesis.  No abdominal pain.  Yesterday developed a mild headache.  No recent falls or injuries.  No sudden onset thunderclap headache.  No fever, neck pain, vision changes, numbness, unilateral weakness, chest pain, shortness of breath, abdominal pain, emesis, pelvic pain, diarrhea, dysuria, hematuria, flank pain.  No meds PTA.  She denies any prior history of migraine however states she has family history of similar.  Headache is nonpositional in nature.  She was seen by urgent care on 02/11/2023 for vaginal discharge.  Labs positive for Candida.  HPI     Home Medications Prior to Admission medications   Medication Sig Start Date End Date Taking? Authorizing Provider  ondansetron (ZOFRAN-ODT) 4 MG disintegrating tablet Take 1 tablet (4 mg total) by mouth every 8 (eight) hours as needed for nausea or vomiting. 02/18/23  Yes Marikay Roads A, PA-C  fluconazole (DIFLUCAN) 150 MG tablet Take 1 tablet (150 mg total) by mouth every 3 (three) days. 01/14/23   Gustavus Bryant, FNP      Allergies    Bee venom, Wasp venom, and Metronidazole    Review of Systems   Review of Systems  Constitutional: Negative.   HENT: Negative.    Respiratory: Negative.    Cardiovascular: Negative.   Gastrointestinal:  Positive for nausea. Negative for abdominal distention, abdominal pain, anal bleeding, blood in stool, constipation, diarrhea, rectal pain and vomiting.  Genitourinary: Negative.   Musculoskeletal: Negative.   Skin: Negative.   Neurological:  Positive for headaches. Negative for dizziness, tremors, seizures, syncope, facial asymmetry, speech difficulty, weakness,  light-headedness and numbness.  All other systems reviewed and are negative.   Physical Exam Updated Vital Signs BP 117/81   Pulse 87   Temp 98 F (36.7 C) (Oral)   Resp 18   Ht 5\' 3"  (1.6 m)   Wt 58.1 kg   LMP 01/23/2023 (Exact Date)   SpO2 100%   BMI 22.67 kg/m  Physical Exam Physical Exam  Constitutional: Pt is oriented to person, place, and time. Pt appears well-developed and well-nourished. No distress.  HENT:  Head: Normocephalic and atraumatic.  Mouth/Throat: Oropharynx is clear and moist.  Eyes: Conjunctivae and EOM are normal. Pupils are equal, round, and reactive to light. No scleral icterus.  No horizontal, vertical or rotational nystagmus  Neck: Normal range of motion. Neck supple.  Full active and passive ROM without pain No midline or paraspinal tenderness No nuchal rigidity or meningeal signs  Cardiovascular: Normal rate, regular rhythm and intact distal pulses.   Pulmonary/Chest: Effort normal and breath sounds normal. No respiratory distress. Pt has no wheezes. No rales.  Abdominal: Soft. Bowel sounds are normal. There is no tenderness. There is no rebound and no guarding.  Musculoskeletal: Normal range of motion.  Lymphadenopathy:    No cervical adenopathy.  Neurological: Pt. is alert and oriented to person, place, and time. He has normal reflexes. No cranial nerve deficit.  Exhibits normal muscle tone. Coordination normal.  Mental Status:  Alert, oriented, thought content appropriate. Speech fluent without evidence of aphasia. Able to follow 2 step commands without difficulty.  Cranial Nerves:  II:  Peripheral visual fields grossly normal, pupils equal, round, reactive to light III,IV, VI: ptosis not present, extra-ocular motions intact bilaterally  V,VII: smile symmetric, facial light touch sensation equal VIII: hearing grossly normal bilaterally  IX,X: midline uvula rise  XI: bilateral shoulder shrug equal and strong XII: midline tongue extension   Motor:  5/5 in upper and lower extremities bilaterally including strong and equal grip strength and dorsiflexion/plantar flexion Sensory: Pinprick and light touch normal in all extremities.  Deep Tendon Reflexes: 2+ and symmetric  Cerebellar: normal finger-to-nose with bilateral upper extremities Gait: normal gait and balance CV: distal pulses palpable throughout   Skin: Skin is warm and dry. No rash noted. Pt is not diaphoretic.  Psychiatric: Pt has a normal mood and affect. Behavior is normal. Judgment and thought content normal.  Nursing note and vitals reviewed.  ED Results / Procedures / Treatments   Labs (all labs ordered are listed, but only abnormal results are displayed) Labs Reviewed  COMPREHENSIVE METABOLIC PANEL - Abnormal; Notable for the following components:      Result Value   Glucose, Bld 100 (*)    All other components within normal limits  CBC - Abnormal; Notable for the following components:   Hemoglobin 11.0 (*)    HCT 35.2 (*)    All other components within normal limits  LIPASE, BLOOD  HCG, SERUM, QUALITATIVE  URINALYSIS, ROUTINE W REFLEX MICROSCOPIC    EKG None  Radiology No results found.  Procedures Procedures    Medications Ordered in ED Medications  prochlorperazine (COMPAZINE) injection 10 mg (10 mg Intravenous Given 02/18/23 1212)  diphenhydrAMINE (BENADRYL) injection 12.5 mg (12.5 mg Intravenous Given 02/18/23 1212)  sodium chloride 0.9 % bolus 1,000 mL (0 mLs Intravenous Stopped 02/18/23 1239)    ED Course/ Medical Decision Making/ A&P    22 year old here for evaluation of nausea and headache.  Headache began yesterday.  Nauseous over the last week.  No sudden onset under clap headache.  Head trauma.  She has nonfocal neuroexam without deficits.  Headache not worse with position changes.  No recent procedural things such as lumbar puncture.  No headache, neck pain, fever.  Low suspicion for meningitis.  She has a nonfocal neuroexam without  deficits.  Does have history of headache, no history of migraine per patient.  Does have family history of migraine.  Will hold off on imaging at this time.  While she has had some nausea without vomiting she has no abdominal pain, urinary symptoms, chest pain, shortness of breath, URI symptoms.  Will check labs, give migraine cocktail, IV fluids and reassess  Labs personally viewed and interpreted:  CBC without leukocytosis, hemoglobin 11.0, similar to prior Metabolic panel without significant abnormality Pregnancy test negative Lipase 24  Nursing called me to bedside as patient sat up, said she felt anxious after medications were administered and requested her IV out that she was leaving.  I went to assess patient at bedside, IV had already been pulled.  She was in the process of getting dressed.  Stated initially she felt anxious but now she feels very sleepy.  She was assisted back to bed.  I suspect her anxiety was likely due to the Compazine.  She did get Benadryl with this initially.  Will plan on letting her sleep and reassess  Patient reassessed. Ambulatory.  States her headache has improved.  States she needs to go pick up her child from school.  Her urinalysis is still pending.  I will plan on calling  her back if this is infectious, she is tolerating p.o. intake, has a nonfocal neuroexam without deficits.  Will DC home with symptomatic management, she will return for any worsening symptoms.  The patient has been appropriately medically screened and/or stabilized in the ED. I have low suspicion for any other emergent medical condition which would require further screening, evaluation or treatment in the ED or require inpatient management.  Patient is hemodynamically stable and in no acute distress.  Patient able to ambulate in department prior to ED.  Evaluation does not show acute pathology that would require ongoing or additional emergent interventions while in the emergency department or  further inpatient treatment.  I have discussed the diagnosis with the patient and answered all questions.  Pain is been managed while in the emergency department and patient has no further complaints prior to discharge.  Patient is comfortable with plan discussed in room and is stable for discharge at this time.  I have discussed strict return precautions for returning to the emergency department.  Patient was encouraged to follow-up with PCP/specialist refer to at discharge.                                 Medical Decision Making Amount and/or Complexity of Data Reviewed External Data Reviewed: labs, radiology and notes. Labs: ordered. Decision-making details documented in ED Course.  Risk OTC drugs. Prescription drug management. Parenteral controlled substances. Decision regarding hospitalization. Diagnosis or treatment significantly limited by social determinants of health.          Final Clinical Impression(s) / ED Diagnoses Final diagnoses:  Acute nonintractable headache, unspecified headache type    Rx / DC Orders ED Discharge Orders          Ordered    ondansetron (ZOFRAN-ODT) 4 MG disintegrating tablet  Every 8 hours PRN        02/18/23 1406              Michal Strzelecki A, PA-C 02/18/23 1407    Loetta Rough, MD 02/18/23 1438

## 2023-02-18 NOTE — Discharge Instructions (Addendum)
It was a pleasure taking care of you here in the emergency department  I have written you for a medication to help you with nausea  You are leaving without your urine resulting, I will call if positive for infection  Follow-up outpatient, return for any worsening symptoms

## 2023-02-18 NOTE — ED Triage Notes (Signed)
Patient has been feeling nauseous for 1 week. Yesterday had a headache. Feel weak when she stands up in her legs.

## 2023-03-15 ENCOUNTER — Ambulatory Visit
Admission: EM | Admit: 2023-03-15 | Discharge: 2023-03-15 | Disposition: A | Discharge status: Home / Self Care | Payer: MEDICAID | Attending: Internal Medicine | Admitting: Internal Medicine

## 2023-03-15 ENCOUNTER — Other Ambulatory Visit: Payer: Self-pay

## 2023-03-15 ENCOUNTER — Ambulatory Visit: Payer: MEDICAID

## 2023-03-15 ENCOUNTER — Encounter: Payer: Self-pay | Admitting: *Deleted

## 2023-03-15 DIAGNOSIS — M79672 Pain in left foot: Secondary | ICD-10-CM

## 2023-03-15 DIAGNOSIS — S80211A Abrasion, right knee, initial encounter: Secondary | ICD-10-CM | POA: Diagnosis not present

## 2023-03-15 DIAGNOSIS — Z23 Encounter for immunization: Secondary | ICD-10-CM | POA: Diagnosis not present

## 2023-03-15 DIAGNOSIS — M25561 Pain in right knee: Secondary | ICD-10-CM

## 2023-03-15 DIAGNOSIS — S90812A Abrasion, left foot, initial encounter: Secondary | ICD-10-CM

## 2023-03-15 MED ORDER — TETANUS-DIPHTH-ACELL PERTUSSIS 5-2.5-18.5 LF-MCG/0.5 IM SUSY
0.5000 mL | PREFILLED_SYRINGE | Freq: Once | INTRAMUSCULAR | Status: AC
  Administered 2023-03-15: 0.5 mL via INTRAMUSCULAR
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
## 2023-03-15 NOTE — Discharge Instructions (Addendum)
I will call with x-ray results.  Apply ice and elevate extremities.  Follow-up with orthopedist if symptoms persist or worsen.

## 2023-04-10 ENCOUNTER — Ambulatory Visit
Admission: EM | Admit: 2023-04-10 | Discharge: 2023-04-10 | Disposition: A | Discharge status: Home / Self Care | Payer: MEDICAID | Attending: Internal Medicine | Admitting: Internal Medicine

## 2023-04-10 DIAGNOSIS — B9689 Other specified bacterial agents as the cause of diseases classified elsewhere: Secondary | ICD-10-CM | POA: Insufficient documentation

## 2023-04-10 DIAGNOSIS — N760 Acute vaginitis: Secondary | ICD-10-CM | POA: Diagnosis present

## 2023-04-10 MED ORDER — METRONIDAZOLE 500 MG PO TABS: 500.0000 mg | ORAL_TABLET | Freq: Two times a day (BID) | ORAL | 0 refills | Status: DC

## 2023-04-10 MED ORDER — BORIC ACID VAGINAL 600 MG VA SUPP: 1.0000 | Freq: Every evening | VAGINAL | 0 refills | Status: DC | PRN

## 2023-04-10 MED ORDER — FLUCONAZOLE 150 MG PO TABS
150.0000 mg | ORAL_TABLET | ORAL | 0 refills | Status: DC
Start: 2023-04-10 — End: 2023-06-23

## 2023-04-10 NOTE — Discharge Instructions (Signed)
Use the boric acid suppository at the last possible second before going to bed. This is a nightly treatment. I am recommending 3 weeks. DO NOT TAKE boric acid suppositories by mouth. For fluconazole take one tablet every 3 days. These medications will treat the 2 different yeast infections that you've been testing positive for.   Hold on to metronidazole and use it if your test results are positive.

## 2023-04-10 NOTE — ED Triage Notes (Signed)
Pt reports whitevvaginal discharge, requested test for BV or yeast infection.

## 2023-04-10 NOTE — ED Provider Notes (Signed)
[facility]  Note:  This document was prepared using Conservation officer, historic buildings and may include unintentional dictation errors.  MRN: [id number] DOB: 2001/05/18  Subjective:   Valerie Newman is a 22 y.o. female presenting for recurrent white vaginal discharge, vaginal irritation.  Patient would like BV and yeast testing only.  Has had frequent bouts of this this year, about 1-2 infections per month.  Has consistently taken fluconazole.  No urinary symptoms.  She does have a gynecologist she can follow-up with.  No current facility-administered medications for this encounter.  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   Allergy to pollen    Anemia    Anxiety    Asthma    Depression    post partum   Headache    Nausea and vomiting during pregnancy 07/21/2017   Ovarian cyst    UTI (urinary tract infection)      Past Surgical History:  Procedure Laterality Date   IUD INSERTION  11/17/2021   IUD REMOVAL  12/27/2021   NO PAST SURGERIES     TOOTH EXTRACTION      Family History  Problem Relation Age of Onset   Heart disease Mother    Stroke Mother    Healthy Father    Diabetes Maternal Aunt    Diabetes Maternal Uncle    Hypertension Maternal Grandmother    Diabetes Maternal Grandmother    Diabetes Maternal Grandfather     Social History   Tobacco Use   Smoking status: Never    Passive exposure: Never   Smokeless tobacco: Never  Vaping Use   Vaping status: Never  Used  Substance Use Topics   Alcohol use: No   Drug use: No    ROS   Objective:   Vitals: BP 109/74 (BP Location: Right Arm)   Pulse 79   Temp 98.8 F (37.1 C) (Oral)   Resp 16   LMP 03/15/2023 (Exact Date)   SpO2 96%   Breastfeeding No   Physical Exam Constitutional:      General: She is not in acute distress.    Appearance: Normal appearance. She is well-developed. She is not ill-appearing, toxic-appearing or diaphoretic.  HENT:     Head: Normocephalic and atraumatic.     Nose: Nose normal.     Mouth/Throat:     Mouth: Mucous membranes are moist.  Eyes:     General: No scleral icterus.       Right eye: No discharge.        Left eye: No discharge.     Extraocular Movements: Extraocular movements intact.  Cardiovascular:     Rate and Rhythm: Normal rate.  Pulmonary:     Effort: Pulmonary effort is normal.  Skin:    General: Skin is warm and dry.  Neurological:     General: No focal deficit present.     Mental Status: She is alert and oriented to person, place,  and time.  Psychiatric:        Mood and Affect: Mood normal.        Behavior: Behavior normal.     Recent Results (from the past 2160 hour(s))  POCT urinalysis dipstick     Status: Abnormal   Collection Time: 01/14/23  7:28 PM  Result Value Ref Range   Color, UA yellow yellow   Clarity, UA clear clear   Glucose, UA negative negative mg/dL   Bilirubin, UA negative negative   Ketones, POC UA negative negative mg/dL   Spec Grav, UA 1.610 9.604 - 1.025   Blood, UA negative negative   pH, UA 7.0 5.0 - 8.0   Protein Ur, POC =30 (A) negative mg/dL   Urobilinogen, UA 1.0 0.2 or 1.0 E.U./dL   Nitrite, UA Negative Negative   Leukocytes, UA Negative Negative  POCT urine pregnancy     Status: Normal   Collection Time: 01/14/23  7:28 PM  Result Value Ref Range   Preg Test, Ur Negative Negative  Cervicovaginal ancillary only     Status: Abnormal   Collection Time: 01/14/23  7:30 PM  Result Value Ref  Range   Neisseria Gonorrhea Negative    Chlamydia Negative    Trichomonas Negative    Bacterial Vaginitis (gardnerella) Negative    Candida Vaginitis Positive (A)    Candida Glabrata Positive (A)    Comment      Normal Reference Range Bacterial Vaginosis - Negative   Comment Normal Reference Range Candida Species - Negative    Comment Normal Reference Range Candida Galbrata - Negative    Comment Normal Reference Range Trichomonas - Negative    Comment Normal Reference Ranger Chlamydia - Negative    Comment      Normal Reference Range Neisseria Gonorrhea - Negative  Cervicovaginal ancillary only     Status: Abnormal   Collection Time: 02/11/23 11:02 AM  Result Value Ref Range   Neisseria Gonorrhea Negative    Chlamydia Negative    Trichomonas Negative    Bacterial Vaginitis (gardnerella) Negative    Candida Vaginitis Positive (A)    Candida Glabrata Positive (A)    Comment      Normal Reference Range Bacterial Vaginosis - Negative   Comment Normal Reference Range Candida Species - Negative    Comment Normal Reference Range Candida Galbrata - Negative    Comment Normal Reference Range Trichomonas - Negative    Comment Normal Reference Ranger Chlamydia - Negative    Comment      Normal Reference Range Neisseria Gonorrhea - Negative  Lipase, blood     Status: None   Collection Time: 02/18/23 11:41 AM  Result Value Ref Range   Lipase 24 11 - 51 U/L    Comment: Performed at [facility], [address], [place], [place] [address]  Comprehensive metabolic panel     Status: Abnormal   Collection Time: 02/18/23 11:41 AM  Result Value Ref Range   Sodium 138 135 - 145 mmol/L   Potassium 3.5 3.5 - 5.1 mmol/L   Chloride 104 98 - 111 mmol/L   CO2 24 22 - 32 mmol/L   Glucose, Bld 100 (H) 70 - 99 mg/dL    Comment: Glucose reference range applies only to samples taken after fasting for at least 8 hours.   BUN 15 6 - 20 mg/dL   Creatinine, Ser 1.19 0.44 - 1.00 mg/dL    Calcium 9.0 8.9 - 14.7 mg/dL   Total Protein 7.9  6.5 - 8.1 g/dL   Albumin 4.2 3.5 - 5.0 g/dL   AST 20 15 - 41 U/L   ALT 11 0 - 44 U/L   Alkaline Phosphatase 46 38 - 126 U/L   Total Bilirubin 0.6 0.3 - 1.2 mg/dL   GFR, Estimated >62 >13 mL/min    Comment: (NOTE) Calculated using the CKD-EPI Creatinine Equation (2021)    Anion gap 10 5 - 15    Comment: Performed at [facility], [address], [place], [place] [address]  CBC     Status: Abnormal   Collection Time: 02/18/23 11:41 AM  Result Value Ref Range   WBC 4.4 4.0 - 10.5 K/uL   RBC 4.15 3.87 - 5.11 MIL/uL   Hemoglobin 11.0 (L) 12.0 - 15.0 g/dL   HCT 84.6 (L) 96.2 - 95.2 %   MCV 84.8 80.0 - 100.0 fL   MCH 26.5 26.0 - 34.0 pg   MCHC 31.3 30.0 - 36.0 g/dL   RDW 84.1 32.4 - 40.1 %   Platelets 233 150 - 400 K/uL   nRBC 0.0 0.0 - 0.2 %    Comment: Performed at [facility], [address] [place], [place] [address]  hCG, serum, qualitative     Status: None   Collection Time: 02/18/23 11:41 AM  Result Value Ref Range   Preg, Serum NEGATIVE NEGATIVE    Comment:        THE SENSITIVITY OF THIS METHODOLOGY IS >10 mIU/mL. Performed at [facility], [address] [place], [place] [address]   Urinalysis, Routine w reflex microscopic -Urine, Clean Catch     Status: Abnormal   Collection Time: 02/18/23  1:58 PM  Result Value Ref Range   Color, Urine YELLOW YELLOW   APPearance CLEAR CLEAR   Specific Gravity, Urine 1.024 1.005 - 1.030   pH 6.0 5.0 - 8.0   Glucose, UA NEGATIVE NEGATIVE mg/dL   Hgb urine dipstick MODERATE (A) NEGATIVE   Bilirubin Urine NEGATIVE NEGATIVE   Ketones, ur NEGATIVE NEGATIVE mg/dL   Protein, ur NEGATIVE NEGATIVE mg/dL   Nitrite NEGATIVE NEGATIVE   Leukocytes,Ua NEGATIVE NEGATIVE   RBC / HPF 21-50 0 - 5 RBC/hpf   WBC, UA 0-5 0 - 5 WBC/hpf   Bacteria, UA NONE SEEN NONE SEEN   Squamous Epithelial / HPF 0-5 0 - 5 /HPF   Mucus PRESENT     Comment:  Performed at [facility], [address], [place], [place] [address]     Assessment and Plan :   PDMP not reviewed this encounter.  1. Acute vaginitis   2. Bacterial vaginosis    Patient has consistently tested positive for Candida vaginitis saying Candida glabrata.  It is my clinical opinion that she is not adequately received treatment for the latter and therefore recommended boric acid suppositories for the next 3 weeks.  Will use concurrent fluconazole.  Patient requested a prescription for metronidazole.  I was agreeable but advised that she hold off on starting this unless she tested positive for bacterial vaginosis.  Patient agreed.  Will follow-up closely with her gynecologist as this is an acute on chronic problem for her now. Counseled patient on potential for adverse effects with medications prescribed/recommended today, ER and return-to-clinic precautions discussed, patient verbalized understanding.    Wallis Bamberg, PA-C 04/10/23 1034

## 2023-04-12 LAB — CERVICOVAGINAL ANCILLARY ONLY
Bacterial Vaginitis (gardnerella): NEGATIVE
Candida Glabrata: POSITIVE — AB
Candida Vaginitis: NEGATIVE
Comment: NEGATIVE
Comment: NEGATIVE
Comment: NEGATIVE

## 2023-05-14 ENCOUNTER — Ambulatory Visit
Admission: EM | Admit: 2023-05-14 | Discharge: 2023-05-14 | Disposition: A | Discharge status: Home / Self Care | Payer: MEDICAID | Attending: Family Medicine | Admitting: Family Medicine

## 2023-05-14 ENCOUNTER — Other Ambulatory Visit: Payer: Self-pay

## 2023-05-14 ENCOUNTER — Encounter: Payer: Self-pay | Admitting: Emergency Medicine

## 2023-05-14 DIAGNOSIS — N3090 Cystitis, unspecified without hematuria: Secondary | ICD-10-CM | POA: Insufficient documentation

## 2023-05-14 DIAGNOSIS — R102 Pelvic and perineal pain: Secondary | ICD-10-CM | POA: Insufficient documentation

## 2023-05-14 DIAGNOSIS — Z202 Contact with and (suspected) exposure to infections with a predominantly sexual mode of transmission: Secondary | ICD-10-CM | POA: Diagnosis present

## 2023-05-14 LAB — POCT URINALYSIS DIP (MANUAL ENTRY)
Bilirubin, UA: NEGATIVE
Glucose, UA: NEGATIVE mg/dL
Ketones, POC UA: NEGATIVE mg/dL
Nitrite, UA: NEGATIVE
Protein Ur, POC: >=300 mg/dL — AB
Spec Grav, UA: >=1.03 — AB (ref 1.010–1.025)
Urobilinogen, UA: 1 U/dL
pH, UA: 7 (ref 5.0–8.0)

## 2023-05-14 LAB — POCT URINE PREGNANCY: Preg Test, Ur: NEGATIVE

## 2023-05-14 MED ORDER — NITROFURANTOIN MONOHYD MACRO 100 MG PO CAPS: 100.0000 mg | ORAL_CAPSULE | Freq: Two times a day (BID) | ORAL | 0 refills | Status: DC

## 2023-05-14 MED ORDER — PHENAZOPYRIDINE HCL 100 MG PO TABS: 100.0000 mg | ORAL_TABLET | Freq: Three times a day (TID) | ORAL | 0 refills | Status: DC | PRN

## 2023-05-14 NOTE — ED Provider Notes (Signed)
[facility]    CSN: [id number] Arrival date & time: 05/14/23  1143      History   Chief Complaint Chief Complaint  Patient presents with   Urinary Urgency    HPI Valerie Newman is a 22 y.o. female.   HPI Here for dysuria and urinary frequency and urgency that began today.  She is also had some cramping in her pelvis all week, but no vaginal discharge or itching.  Last menstrual cycle was November 21.  She did notice some blood in her urine today but has not had any vaginal bleeding since her period ended.  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   Allergy to pollen    Anemia    Anxiety    Asthma    Depression    post partum   Headache    Nausea and vomiting during pregnancy 07/21/2017   Ovarian cyst    UTI (urinary tract infection)     Patient Active Problem List   Diagnosis Date Noted   Infusion reaction 07/15/2021    Past Surgical History:  Procedure Laterality Date   IUD INSERTION  11/17/2021   IUD REMOVAL  12/27/2021   NO PAST SURGERIES     TOOTH EXTRACTION      OB History     Gravida  3   Para  2   Term  2   Preterm  0   AB  1   Living  2      SAB  0   IAB  1   Ectopic  0   Multiple  0   Live Births  2            Home Medications    Prior to Admission medications   Medication Sig Start Date End Date Taking? Authorizing Provider  albuterol (VENTOLIN HFA) 108 (90 Base) MCG/ACT inhaler Inhale into the lungs. 12/11/20  Yes [provider]  fluconazole (DIFLUCAN) 150 MG tablet Take 1 tablet (150 mg total) by mouth every 3 (three) days. 04/10/23  Yes Wallis Bamberg, PA-C  metroNIDAZOLE (FLAGYL) 500 MG tablet Take 1 tablet (500 mg total) by mouth 2 (two) times daily with a meal. DO NOT CONSUME ALCOHOL WHILE TAKING THIS MEDICATION. 04/10/23  Yes Wallis Bamberg, PA-C  nitrofurantoin, macrocrystal-monohydrate, (MACROBID) 100  MG capsule Take 1 capsule (100 mg total) by mouth 2 (two) times daily. 05/14/23  Yes Zenia Resides, MD  phenazopyridine (PYRIDIUM) 100 MG tablet Take 1 tablet (100 mg total) by mouth 3 (three) times daily as needed (urinary pain). 05/14/23  Yes Zenia Resides, MD  Boric Acid Vaginal 600 MG SUPP Place 1 suppository vaginally at bedtime as needed. 04/10/23   Wallis Bamberg, PA-C  ondansetron (ZOFRAN-ODT) 4 MG disintegrating tablet Take 1 tablet (4 mg total) by mouth every 8 (eight) hours as needed for nausea or vomiting. 02/18/23   Henderly, Britni A, PA-C    Family History Family History  Problem Relation Age of Onset   Heart disease Mother    Stroke Mother    Healthy Father    Diabetes Maternal Aunt    Diabetes Maternal Uncle    Hypertension Maternal Grandmother    Diabetes Maternal Grandmother    Diabetes Maternal Grandfather     Social History Social History   Tobacco Use   Smoking status: Never    Passive exposure:  Never   Smokeless tobacco: Never  Vaping Use   Vaping status: Never Used  Substance Use Topics   Alcohol use: No   Drug use: No     Allergies   Bee venom, Wasp venom, and Metronidazole   Review of Systems Review of Systems   Physical Exam Triage Vital Signs ED Triage Vitals  Encounter Vitals Group     BP 05/14/23 1236 95/61     Systolic BP Percentile --      Diastolic BP Percentile --      Pulse Rate 05/14/23 1236 84     Resp 05/14/23 1236 16     Temp 05/14/23 1236 98.3 F (36.8 C)     Temp Source 05/14/23 1236 Oral     SpO2 05/14/23 1236 98 %     Weight 05/14/23 1237 126 lb (57.2 kg)     Height 05/14/23 1237 5\' 3"  (1.6 m)     Head Circumference --      Peak Flow --      Pain Score 05/14/23 1237 8     Pain Loc --      Pain Education --      Exclude from Growth Chart --    No data found.  Updated Vital Signs BP 95/61 (BP Location: Left Arm)   Pulse 84   Temp 98.3 F (36.8 C) (Oral)   Resp 16   Ht 5\' 3"  (1.6 m)   Wt 57.2 kg    LMP 05/06/2023 (Exact Date)   SpO2 98%   Breastfeeding No   BMI 22.32 kg/m   Visual Acuity Right Eye Distance:   Left Eye Distance:   Bilateral Distance:    Right Eye Near:   Left Eye Near:    Bilateral Near:     Physical Exam Vitals reviewed.  Constitutional:      General: She is not in acute distress. HENT:     Mouth/Throat:     Mouth: Mucous membranes are moist.  Eyes:     Extraocular Movements: Extraocular movements intact.     Conjunctiva/sclera: Conjunctivae normal.     Pupils: Pupils are equal, round, and reactive to light.  Cardiovascular:     Rate and Rhythm: Normal rate and regular rhythm.     Heart sounds: No murmur heard. Pulmonary:     Effort: Pulmonary effort is normal.     Breath sounds: Normal breath sounds.  Abdominal:     Palpations: Abdomen is soft.     Comments: Mild suprapubic tenderness  Skin:    Coloration: Skin is not pale.  Neurological:     General: No focal deficit present.     Mental Status: She is alert and oriented to person, place, and time.  Psychiatric:        Behavior: Behavior normal.      UC Treatments / Results  Labs (all labs ordered are listed, but only abnormal results are displayed) Labs Reviewed  POCT URINALYSIS DIP (MANUAL ENTRY) - Abnormal; Notable for the following components:      Result Value   Color, UA straw (*)    Clarity, UA turbid (*)    Spec Grav, UA >=1.030 (*)    Blood, UA large (*)    Protein Ur, POC >=300 (*)    Leukocytes, UA Trace (*)    All other components within normal limits  URINE CULTURE  HIV ANTIBODY (ROUTINE TESTING W REFLEX)  RPR  POCT URINE PREGNANCY  CERVICOVAGINAL ANCILLARY ONLY  EKG   Radiology No results found.  Procedures Procedures (including critical care time)  Medications Ordered in UC Medications - No data to display  Initial Impression / Assessment and Plan / UC Course  I have reviewed the triage vital signs and the nursing notes.  Pertinent labs & imaging  results that were available during my care of the patient were reviewed by me and considered in my medical decision making (see chart for details).     Urine pregnancy test is negative Urinalysis shows large amount red blood cells, trace of leukocytes and it is concentrated.  Urine culture will be sent. Macrobid is sent in to treat the UTI.  Vaginal self swab is done, and we will notify of any positives on that and treat per protocol. She also wants to do the blood work for HIV and RPR. Final Clinical Impressions(s) / UC Diagnoses   Final diagnoses:  Cystitis  Pelvic pain  Exposure to STD     Discharge Instructions      Urine pregnancy test was negative  Urinalysis shows some white and red blood cells, which could be a sign of bladder infection.  Take nitrofurantoin 100 mg--1 capsule 2 times daily for 5 days  Take Pyridium/phenazopyridine 100 mg--1 tablet 3 times daily as needed for urinary pain.  This medication usually makes the urine orange  Make sure you are drinking plenty of fluids.  Urine culture is sent and if it looks like the antibiotic needs to be changed, staff will notify you.  Staff will notify you if there is anything positive on the swab with the blood work.      ED Prescriptions     Medication Sig Dispense Auth. Provider   nitrofurantoin, macrocrystal-monohydrate, (MACROBID) 100 MG capsule Take 1 capsule (100 mg total) by mouth 2 (two) times daily. 10 capsule Zenia Resides, MD   phenazopyridine (PYRIDIUM) 100 MG tablet Take 1 tablet (100 mg total) by mouth 3 (three) times daily as needed (urinary pain). 10 tablet Marlinda Mike Janace Aris, MD      PDMP not reviewed this encounter.   Zenia Resides, MD 05/14/23 [phone]

## 2023-05-14 NOTE — Discharge Instructions (Addendum)
Urine pregnancy test was negative  Urinalysis shows some white and red blood cells, which could be a sign of bladder infection.  Take nitrofurantoin 100 mg--1 capsule 2 times daily for 5 days  Take Pyridium/phenazopyridine 100 mg--1 tablet 3 times daily as needed for urinary pain.  This medication usually makes the urine orange  Make sure you are drinking plenty of fluids.  Urine culture is sent and if it looks like the antibiotic needs to be changed, staff will notify you.  Staff will notify you if there is anything positive on the swab with the blood work.

## 2023-05-14 NOTE — ED Triage Notes (Signed)
Patient c/o abdominal pain all week, then she noticed increased urinary urgency and dysuria x 1 day.  Patient noticed hematuria today.  Requesting STI testing.

## 2023-05-15 LAB — HIV ANTIBODY (ROUTINE TESTING W REFLEX): HIV Screen 4th Generation wRfx: NONREACTIVE

## 2023-05-15 LAB — RPR: RPR Ser Ql: NONREACTIVE

## 2023-05-16 LAB — URINE CULTURE: Culture: >=100000 — AB

## 2023-05-17 LAB — CERVICOVAGINAL ANCILLARY ONLY
Chlamydia: NEGATIVE
Comment: NEGATIVE
Comment: NEGATIVE
Comment: NORMAL
Neisseria Gonorrhea: NEGATIVE
Trichomonas: NEGATIVE

## 2023-06-18 ENCOUNTER — Ambulatory Visit
Admission: EM | Admit: 2023-06-18 | Discharge: 2023-06-18 | Disposition: A | Discharge status: Home / Self Care | Payer: MEDICAID

## 2023-06-19 ENCOUNTER — Encounter: Payer: Self-pay | Admitting: Emergency Medicine

## 2023-06-19 ENCOUNTER — Ambulatory Visit: Admit: 2023-06-19 | Discharge: 2023-06-19 | Discharge status: Home / Self Care | Payer: MEDICAID

## 2023-06-19 ENCOUNTER — Ambulatory Visit
Admission: EM | Admit: 2023-06-19 | Discharge: 2023-06-19 | Disposition: A | Discharge status: Home / Self Care | Payer: MEDICAID | Attending: Physician Assistant | Admitting: Physician Assistant

## 2023-06-19 ENCOUNTER — Other Ambulatory Visit: Payer: Self-pay

## 2023-06-19 DIAGNOSIS — N898 Other specified noninflammatory disorders of vagina: Secondary | ICD-10-CM | POA: Diagnosis not present

## 2023-06-19 LAB — POCT URINALYSIS DIP (MANUAL ENTRY)
Bilirubin, UA: NEGATIVE
Blood, UA: NEGATIVE
Glucose, UA: NEGATIVE mg/dL
Ketones, POC UA: NEGATIVE mg/dL
Leukocytes, UA: NEGATIVE
Nitrite, UA: NEGATIVE
Protein Ur, POC: NEGATIVE mg/dL
Spec Grav, UA: 1.025 (ref 1.010–1.025)
Urobilinogen, UA: 0.2 U/dL
pH, UA: 7 (ref 5.0–8.0)

## 2023-06-19 LAB — POCT URINE PREGNANCY: Preg Test, Ur: NEGATIVE

## 2023-06-19 NOTE — ED Provider Notes (Signed)
 [name]    CSN: [id number] Arrival date & time: 06/19/23  9041      History   Chief Complaint Chief Complaint  Patient presents with   Vaginal Discharge    HPI Valerie Newman is a 23 y.o. female.   Patient here today for evaluation of vaginal discharge that started 2 days ago.  She reports some mild vaginal discomfort as well.  She has had some dysuria.  She denies any STD exposures.  She denies any abdominal pain or back pain.  She denies fever.  She declines blood work today.  The history is provided by the patient.  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   Allergy to pollen    Anemia    Anxiety    Asthma    Depression    post partum   Headache    Nausea and vomiting during pregnancy 07/21/2017   Ovarian cyst    UTI (urinary tract infection)     Patient Active Problem List   Diagnosis Date Noted   Infusion reaction 07/15/2021    Past Surgical History:  Procedure Laterality Date   IUD INSERTION  11/17/2021   IUD REMOVAL  12/27/2021   NO PAST SURGERIES     TOOTH EXTRACTION      OB History     Gravida  3   Para  2   Term  2   Preterm  0   AB  1   Living  2      SAB  0   IAB  1   Ectopic  0   Multiple  0   Live Births  2            Home Medications    Prior to Admission medications   Medication Sig Start Date End Date Taking? Authorizing Provider  albuterol  (VENTOLIN  HFA) 108 (90 Base) MCG/ACT inhaler Inhale into the lungs. 12/11/20   [provider]  Boric Acid Vaginal 600 MG SUPP Place 1 suppository vaginally at bedtime as needed. 04/10/23   Christopher Savannah, PA-C  doxycycline  (VIBRAMYCIN ) 100 MG capsule Take 100 mg by mouth 2 (two) times daily. 08/14/19   [provider]  Doxylamine -Pyridoxine  10-10 MG TBEC Take 1 tablet by mouth daily.  02/09/21   [provider]  fluconazole  (DIFLUCAN ) 150 MG tablet Take 1 tablet (150 mg total) by mouth every 3 (three) days. Patient not taking: Reported on 06/19/2023 04/10/23   Christopher Savannah, PA-C  fluconazole  (DIFLUCAN ) 150 MG tablet Take 150 mg by mouth once. 03/25/23   [provider]  loratadine  (CLARITIN ) 10 MG tablet Take 10 mg by mouth daily. 08/24/19   [provider]  metroNIDAZOLE  (FLAGYL ) 500 MG tablet Take 1 tablet (500 mg total) by mouth 2 (two) times daily with a meal. DO NOT CONSUME ALCOHOL WHILE TAKING THIS MEDICATION. Patient not taking: Reported on 06/19/2023 04/10/23   Christopher Savannah, PA-C  Multiple Vitamin (MULTI-VITAMIN) tablet Take 1 tablet by mouth daily. 08/24/19   [provider]  nitrofurantoin , macrocrystal-monohydrate, (MACROBID ) 100 MG capsule Take 1 capsule (100 mg total) by mouth 2 (two) times daily. Patient not taking: Reported on 06/19/2023 05/14/23   Vonna Sharlet POUR, MD  ondansetron  (ZOFRAN -ODT) 4 MG disintegrating tablet Take 1 tablet (4 mg total) by mouth every  8 (eight) hours as needed for nausea or vomiting. 02/18/23   Henderly, Britni A, PA-C  ondansetron  (ZOFRAN -ODT) 4 MG disintegrating tablet Take 4 mg by mouth every 8 (eight) hours as needed for nausea. 02/24/22   [provider]  phenazopyridine  (PYRIDIUM ) 100 MG tablet Take 1 tablet (100 mg total) by mouth 3 (three) times daily as needed (urinary pain). 05/14/23   Vonna Sharlet POUR, MD    Family History Family History  Problem Relation Age of Onset   Heart disease Mother    Stroke Mother    Healthy Father    Diabetes Maternal Aunt    Diabetes Maternal Uncle    Hypertension Maternal Grandmother    Diabetes Maternal Grandmother    Diabetes Maternal Grandfather     Social History Social History   Tobacco Use   Smoking status: Never    Passive exposure: Never   Smokeless tobacco: Never  Vaping Use   Vaping status: Never Used  Substance Use Topics   Alcohol  use: No   Drug use: No     Allergies   Bee venom, Metronidazole , and Wasp venom   Review of Systems Review of Systems  Constitutional:  Negative for chills and fever.  Eyes:  Negative for discharge and redness.  Respiratory:  Negative for shortness of breath.   Gastrointestinal:  Negative for abdominal pain, nausea and vomiting.  Genitourinary:  Positive for dysuria and vaginal discharge.  Musculoskeletal:  Negative for back pain.     Physical Exam Triage Vital Signs ED Triage Vitals  Encounter Vitals Group     BP 06/19/23 1039 (!) 109/50     Systolic BP Percentile --      Diastolic BP Percentile --      Pulse Rate 06/19/23 1039 78     Resp 06/19/23 1039 18     Temp 06/19/23 1039 98 F (36.7 C)     Temp Source 06/19/23 1039 Oral     SpO2 06/19/23 1039 98 %     Weight --      Height --      Head Circumference --      Peak Flow --      Pain Score 06/19/23 1040 2     Pain Loc --      Pain Education --      Exclude from Growth Chart --    No data found.  Updated Vital Signs BP (!) 109/50 (BP Location: Left Arm)   Pulse 78   Temp 98 F (36.7 C) (Oral)   Resp 18   SpO2 98%   Visual Acuity Right Eye Distance:   Left Eye Distance:   Bilateral Distance:    Right Eye Near:   Left Eye Near:    Bilateral Near:     Physical Exam Vitals and nursing note reviewed.  Constitutional:      General: She is not in acute distress.    Appearance: Normal appearance. She is not ill-appearing.  HENT:     Head: Normocephalic and atraumatic.  Eyes:     Conjunctiva/sclera: Conjunctivae normal.  Cardiovascular:     Rate and Rhythm: Normal rate.  Pulmonary:     Effort: Pulmonary effort is normal. No respiratory distress.  Neurological:     Mental Status: She is alert.  Psychiatric:        Mood and Affect: Mood normal.        Behavior: Behavior normal.        Thought Content: Thought content  normal.      UC Treatments / Results  Labs (all labs ordered are listed,  but only abnormal results are displayed) Labs Reviewed  POCT URINALYSIS DIP (MANUAL ENTRY) - Abnormal; Notable for the following components:      Result Value   Clarity, UA cloudy (*)    All other components within normal limits  POCT URINE PREGNANCY - Normal  CERVICOVAGINAL ANCILLARY ONLY    EKG   Radiology No results found.  Procedures Procedures (including critical care time)  Medications Ordered in UC Medications - No data to display  Initial Impression / Assessment and Plan / UC Course  I have reviewed the triage vital signs and the nursing notes.  Pertinent labs & imaging results that were available during my care of the patient were reviewed by me and considered in my medical decision making (see chart for details).    UA without signs of UTI.  Urine pregnancy test negative.  Will screen for BV, yeast, gonorrhea, chlamydia and trichomonas.  Will await results further recommendation but encouraged follow-up with any further concerns.  Final Clinical Impressions(s) / UC Diagnoses   Final diagnoses:  Vaginal discharge   Discharge Instructions   None    ED Prescriptions   None    PDMP not reviewed this encounter.   Billy Asberry FALCON, PA-C 06/19/23 1130

## 2023-06-19 NOTE — ED Triage Notes (Signed)
 Pt sts vaginal discharge and some discomfort x 2 days; pt sts some dysuria

## 2023-06-21 LAB — CERVICOVAGINAL ANCILLARY ONLY
Bacterial Vaginitis (gardnerella): NEGATIVE
Candida Glabrata: NEGATIVE
Candida Vaginitis: POSITIVE — AB
Chlamydia: NEGATIVE
Comment: NEGATIVE
Comment: NEGATIVE
Comment: NEGATIVE
Comment: NEGATIVE
Comment: NEGATIVE
Comment: NORMAL
Neisseria Gonorrhea: NEGATIVE
Trichomonas: NEGATIVE

## 2023-06-23 ENCOUNTER — Telehealth (HOSPITAL_COMMUNITY): Payer: Self-pay | Admitting: Emergency Medicine

## 2023-06-23 MED ORDER — FLUCONAZOLE 150 MG PO TABS: 150.0000 mg | ORAL_TABLET | Freq: Once | ORAL | 0 refills | Status: AC

## 2023-06-23 NOTE — Telephone Encounter (Signed)
 Diflucan for positive yeast

## 2023-07-24 ENCOUNTER — Ambulatory Visit
Admission: EM | Admit: 2023-07-24 | Discharge: 2023-07-24 | Disposition: A | Discharge status: Home / Self Care | Payer: MEDICAID | Attending: Internal Medicine | Admitting: Internal Medicine

## 2023-07-24 ENCOUNTER — Encounter: Payer: Self-pay | Admitting: *Deleted

## 2023-07-24 ENCOUNTER — Other Ambulatory Visit: Payer: Self-pay

## 2023-07-24 DIAGNOSIS — R051 Acute cough: Secondary | ICD-10-CM | POA: Diagnosis present

## 2023-07-24 DIAGNOSIS — R519 Headache, unspecified: Secondary | ICD-10-CM

## 2023-07-24 DIAGNOSIS — N898 Other specified noninflammatory disorders of vagina: Secondary | ICD-10-CM | POA: Insufficient documentation

## 2023-07-24 DIAGNOSIS — B349 Viral infection, unspecified: Secondary | ICD-10-CM | POA: Diagnosis present

## 2023-07-24 DIAGNOSIS — J069 Acute upper respiratory infection, unspecified: Secondary | ICD-10-CM | POA: Diagnosis present

## 2023-07-24 DIAGNOSIS — Z113 Encounter for screening for infections with a predominantly sexual mode of transmission: Secondary | ICD-10-CM

## 2023-07-24 LAB — POCT URINALYSIS DIP (MANUAL ENTRY)
Bilirubin, UA: NEGATIVE
Glucose, UA: NEGATIVE mg/dL
Ketones, POC UA: NEGATIVE mg/dL
Leukocytes, UA: NEGATIVE
Nitrite, UA: NEGATIVE
Protein Ur, POC: NEGATIVE mg/dL
Spec Grav, UA: 1.025 (ref 1.010–1.025)
Urobilinogen, UA: 0.2 U/dL
pH, UA: 6 (ref 5.0–8.0)

## 2023-07-24 LAB — POCT INFLUENZA A/B
Influenza A, POC: NEGATIVE
Influenza B, POC: NEGATIVE

## 2023-07-24 LAB — POCT URINE PREGNANCY: Preg Test, Ur: NEGATIVE

## 2023-07-24 MED ORDER — PREDNISONE 20 MG PO TABS
40.0000 mg | ORAL_TABLET | Freq: Every day | ORAL | 0 refills | Status: AC
Start: 2023-07-24 — End: 2023-07-29

## 2023-07-24 MED ORDER — PROMETHAZINE-DM 6.25-15 MG/5ML PO SYRP: 5.0000 mL | ORAL_SOLUTION | Freq: Three times a day (TID) | ORAL | 0 refills | Status: DC | PRN

## 2023-07-24 MED ORDER — KETOROLAC TROMETHAMINE 30 MG/ML IJ SOLN
30.0000 mg | Freq: Once | INTRAMUSCULAR | Status: AC
  Administered 2023-07-24: 30 mg via INTRAMUSCULAR

## 2023-07-24 MED ORDER — ONDANSETRON 4 MG PO TBDP: 4.0000 mg | ORAL_TABLET | Freq: Three times a day (TID) | ORAL | 0 refills | Status: DC | PRN

## 2023-07-24 MED ORDER — DEXAMETHASONE SODIUM PHOSPHATE 10 MG/ML IJ SOLN
10.0000 mg | Freq: Once | INTRAMUSCULAR | Status: AC
  Administered 2023-07-24: 10 mg via INTRAMUSCULAR

## 2023-07-24 NOTE — ED Provider Notes (Signed)
 [name]    CSN: [id number] Arrival date & time: 07/24/23  0821      History   Chief Complaint Chief Complaint  Patient presents with   Headache   Chills    HPI Anaijah Augsburger is a 23 y.o. female.   23 year old female who presents to urgent care with complaints of headache, nausea, chills, fevers, body aches and cough.  Her symptoms started yesterday.  She also relates some ear pain.  The worst of her symptoms is the headache and bodyaches.  She denies any sick contacts although she does have a young child.  Her child has had ear infections recently.  She denies shortness of breath, sore throat, dysuria, hematuria, back pain, abdominal pain.  She has not vomited but she is feeling very nauseated.  She would also like to have a urinalysis and STI done for screening.   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   Allergy to pollen    Anemia    Anxiety    Asthma    Depression    post partum   Headache    Nausea and vomiting during pregnancy 07/21/2017   Ovarian cyst    UTI (urinary tract infection)     Patient Active Problem List   Diagnosis Date Noted   Infusion reaction 07/15/2021    Past Surgical History:  Procedure Laterality Date   IUD INSERTION  11/17/2021   IUD REMOVAL  12/27/2021   NO PAST SURGERIES     TOOTH EXTRACTION      OB History     Gravida  3   Para  2   Term  2   Preterm  0   AB  1   Living  2      SAB  0   IAB  1   Ectopic  0   Multiple  0   Live Births  2            Home Medications    Prior to Admission medications   Medication Sig Start Date End Date Taking? Authorizing Provider  albuterol  (VENTOLIN  HFA) 108 (90 Base) MCG/ACT inhaler Inhale into the lungs. 12/11/20   [provider]  Boric Acid Vaginal 600 MG SUPP Place 1 suppository vaginally at bedtime as needed. 04/10/23   Christopher Savannah, PA-C  doxycycline  (VIBRAMYCIN ) 100 MG capsule Take 100 mg by mouth 2 (two) times daily. 08/14/19   [provider]  Doxylamine -Pyridoxine  10-10 MG TBEC Take 1 tablet by mouth daily. 02/09/21   [provider]  loratadine  (CLARITIN ) 10 MG tablet Take 10 mg by mouth daily. 08/24/19   [provider]  metroNIDAZOLE  (FLAGYL ) 500 MG tablet Take 1 tablet (500 mg total) by mouth 2 (two) times daily with a meal. DO NOT CONSUME ALCOHOL WHILE TAKING THIS MEDICATION. Patient not taking: Reported on 06/19/2023 04/10/23   Christopher Savannah, PA-C  Multiple Vitamin (MULTI-VITAMIN) tablet Take 1 tablet by mouth daily. 08/24/19   [provider]  nitrofurantoin , macrocrystal-monohydrate, (MACROBID ) 100 MG capsule Take 1 capsule (100 mg total) by mouth 2 (two) times daily. Patient not taking: Reported on 06/19/2023 05/14/23   Vonna Sharlet POUR, MD  ondansetron  (ZOFRAN -ODT) 4 MG disintegrating tablet Take  1 tablet (4 mg total) by mouth every 8 (eight) hours as needed for nausea or vomiting. 02/18/23   Henderly, Britni A, PA-C  ondansetron  (ZOFRAN -ODT) 4 MG disintegrating tablet Take 4 mg by mouth every 8 (eight) hours as needed for nausea. 02/24/22   [provider]  phenazopyridine  (PYRIDIUM ) 100 MG tablet Take 1 tablet (100 mg total) by mouth 3 (three) times daily as needed (urinary pain). 05/14/23   Vonna Sharlet POUR, MD    Family History Family History  Problem Relation Age of Onset   Heart disease Mother    Stroke Mother    Healthy Father    Diabetes Maternal Aunt    Diabetes Maternal Uncle    Hypertension Maternal Grandmother    Diabetes Maternal Grandmother    Diabetes Maternal Grandfather     Social History Social History   Tobacco Use   Smoking status: Never    Passive exposure: Never   Smokeless tobacco: Never  Vaping Use   Vaping status:  Never Used  Substance Use Topics   Alcohol use: No   Drug use: No     Allergies   Bee venom, Metronidazole , and Wasp venom   Review of Systems Review of Systems  Constitutional:  Positive for chills and fever.  HENT:  Negative for ear pain and sore throat.   Eyes:  Negative for pain and visual disturbance.  Respiratory:  Negative for cough and shortness of breath.   Cardiovascular:  Negative for chest pain and palpitations.  Gastrointestinal:  Positive for nausea. Negative for abdominal pain and vomiting.  Genitourinary:  Positive for vaginal discharge. Negative for dysuria and hematuria.  Musculoskeletal:  Negative for arthralgias and back pain.  Skin:  Negative for color change and rash.  Neurological:  Positive for headaches. Negative for seizures and syncope.  All other systems reviewed and are negative.    Physical Exam Triage Vital Signs ED Triage Vitals  Encounter Vitals Group     BP 07/24/23 0910 113/79     Systolic BP Percentile --      Diastolic BP Percentile --      Pulse Rate 07/24/23 0910 90     Resp 07/24/23 0910 18     Temp 07/24/23 0910 99.9 F (37.7 C)     Temp Source 07/24/23 0910 Oral     SpO2 07/24/23 0910 98 %     Weight --      Height --      Head Circumference --      Peak Flow --      Pain Score 07/24/23 0908 8     Pain Loc --      Pain Education --      Exclude from Growth Chart --    No data found.  Updated Vital Signs BP 113/79 (BP Location: Left Arm)   Pulse 90   Temp 99.9 F (37.7 C) (Oral)   Resp 18   LMP 07/05/2023 (Approximate)   SpO2 98%   Breastfeeding No   Visual Acuity Right Eye Distance:   Left Eye Distance:   Bilateral Distance:    Right Eye Near:   Left Eye Near:    Bilateral Near:     Physical Exam Vitals and nursing note reviewed.  Constitutional:      General: She is not in acute distress.    Appearance: She is well-developed.  HENT:     Head: Normocephalic and atraumatic.     Right Ear: Tympanic  membrane normal. No  middle ear effusion.     Left Ear: Tympanic membrane normal.  No middle ear effusion.     Ears:     Comments: Mild condensation on the tympanic membrane    Nose: Nose normal.     Mouth/Throat:     Mouth: Mucous membranes are moist.  Eyes:     Conjunctiva/sclera: Conjunctivae normal.     Pupils: Pupils are equal, round, and reactive to light.  Cardiovascular:     Rate and Rhythm: Normal rate and regular rhythm.     Heart sounds: No murmur heard. Pulmonary:     Effort: Pulmonary effort is normal. No respiratory distress.     Breath sounds: Normal breath sounds.  Abdominal:     Palpations: Abdomen is soft.     Tenderness: There is no abdominal tenderness.  Musculoskeletal:        General: No swelling.     Cervical back: Neck supple.  Skin:    General: Skin is warm and dry.     Capillary Refill: Capillary refill takes less than 2 seconds.  Neurological:     General: No focal deficit present.     Mental Status: She is alert and oriented to person, place, and time.     Cranial Nerves: No cranial nerve deficit or dysarthria.     Sensory: No sensory deficit.     Motor: No weakness.  Psychiatric:        Mood and Affect: Mood normal.        Speech: Speech normal.        Behavior: Behavior normal.      UC Treatments / Results  Labs (all labs ordered are listed, but only abnormal results are displayed) Labs Reviewed  POCT URINALYSIS DIP (MANUAL ENTRY) - Abnormal; Notable for the following components:      Result Value   Blood, UA trace-intact (*)    All other components within normal limits  POCT URINE PREGNANCY  POCT INFLUENZA A/B  CERVICOVAGINAL ANCILLARY ONLY    EKG   Radiology No results found.  Procedures Procedures (including critical care time)  Medications Ordered in UC Medications  ketorolac  (TORADOL ) 30 MG/ML injection 30 mg (has no administration in time range)  dexamethasone  (DECADRON ) injection 10 mg (has no administration in time  range)    Initial Impression / Assessment and Plan / UC Course  I have reviewed the triage vital signs and the nursing notes.  Pertinent labs & imaging results that were available during my care of the patient were reviewed by me and considered in my medical decision making (see chart for details).     Acute cough  Headache due to viral infection  Viral upper respiratory tract infection with cough   Flu A and Flu B are negative. Symptoms consistent with a viral upper respiratory infection.  This is a virus and does not require antibiotic treatment. This can cause headaches, nausea, fevers, body aches and cough as well as other symptoms. We will treat this with the following: Toradol  injection given today. This is a medication to help with pain. This is not a narcotic.  Decadron  injection given today. This is a steroid to help with inflammation and pain. Urinalysis negative for infection and pregnancy test negative. Prednisone  40 mg (2 tablets) once daily for 5 days. Take this in the morning.  This is a steroid to help with inflammation and pain. Promethazine  DM 5 mL every 8 hours as needed for cough.  Use caution as this medication  can cause drowsiness. Zofran  4 mg orally disintegrating tablet every 8 hours as needed for nausea.  Rest and stay hydrated.  Try to drink plenty of fluids even if you do not feel like eating solid food. Return to urgent care or PCP if symptoms worsen or fail to resolve.    Final Clinical Impressions(s) / UC Diagnoses   Final diagnoses:  Acute cough  Headache due to viral infection  Viral upper respiratory tract infection with cough     Discharge Instructions      Flu A and Flu B are negative. Symptoms consistent with a viral upper respiratory infection.  This is a virus and does not require antibiotic treatment. This can cause headaches, nausea, fevers, body aches and cough as well as other symptoms. We will treat this with the following: Toradol   injection given today. This is a medication to help with pain. This is not a narcotic.  Decadron  injection given today. This is a steroid to help with inflammation and pain. Urinalysis negative for infection and pregnancy test negative. Prednisone  40 mg (2 tablets) once daily for 5 days. Take this in the morning.  This is a steroid to help with inflammation and pain. Promethazine  DM 5 mL every 8 hours as needed for cough.  Use caution as this medication can cause drowsiness. Zofran  4 mg orally disintegrating tablet every 8 hours as needed for nausea.  Rest and stay hydrated.  Try to drink plenty of fluids even if you do not feel like eating solid food. Return to urgent care or PCP if symptoms worsen or fail to resolve.       ED Prescriptions   None    PDMP not reviewed this encounter.   Teresa Almarie LABOR, [place] 07/24/23 1006

## 2023-07-24 NOTE — ED Triage Notes (Addendum)
 Headache, nausea, and chills since yesterday. Also reports she is having some vaginal discharge and wants to be checked for UTI and STI

## 2023-07-24 NOTE — Discharge Instructions (Addendum)
 Flu A and Flu B are negative. Symptoms consistent with a viral upper respiratory infection.  This is a virus and does not require antibiotic treatment. This can cause headaches, nausea, fevers, body aches and cough as well as other symptoms. We will treat this with the following: Toradol  injection given today. This is a medication to help with pain. This is not a narcotic.  Decadron  injection given today. This is a steroid to help with inflammation and pain. Urinalysis negative for infection and pregnancy test negative. Prednisone  40 mg (2 tablets) once daily for 5 days. Take this in the morning.  This is a steroid to help with inflammation and pain. Start this tomorrow 2/9 Promethazine  DM 5 mL every 8 hours as needed for cough.  Use caution as this medication can cause drowsiness. Zofran  4 mg orally disintegrating tablet every 8 hours as needed for nausea.  Rest and stay hydrated.  Try to drink plenty of fluids even if you do not feel like eating solid food. Return to urgent care or PCP if symptoms worsen or fail to resolve.

## 2023-07-26 LAB — CERVICOVAGINAL ANCILLARY ONLY
Bacterial Vaginitis (gardnerella): NEGATIVE
Chlamydia: NEGATIVE
Comment: NEGATIVE
Comment: NEGATIVE
Comment: NEGATIVE
Comment: NORMAL
Neisseria Gonorrhea: NEGATIVE
Trichomonas: NEGATIVE

## 2023-08-07 ENCOUNTER — Encounter: Payer: Self-pay | Admitting: Emergency Medicine

## 2023-08-07 ENCOUNTER — Ambulatory Visit
Admission: EM | Admit: 2023-08-07 | Discharge: 2023-08-07 | Disposition: A | Discharge status: Home / Self Care | Payer: MEDICAID | Attending: Family Medicine | Admitting: Family Medicine

## 2023-08-07 DIAGNOSIS — N760 Acute vaginitis: Secondary | ICD-10-CM | POA: Insufficient documentation

## 2023-08-07 MED ORDER — FLUCONAZOLE 150 MG PO TABS: 150.0000 mg | ORAL_TABLET | ORAL | 0 refills | Status: AC

## 2023-08-07 NOTE — ED Provider Notes (Signed)
 [name]    CSN: [id number] Arrival date & time: 08/07/23  1028      History   Chief Complaint Chief Complaint  Patient presents with   yeast    HPI Valerie Newman is a 23 y.o. female.   HPI Here for vaginal discharge and itching.  Symptoms began a couple of days ago.  No abdominal pain and no fever or vomiting.  No dysuria  Last menstrual cycle was February 7.  She mentions she has had recurrent yeast infections and wonders that often she has a yeast infection right after she has had intercourse.  She does not tolerate metronidazole which causes abdominal pain, nausea and vomiting. 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   Allergy to pollen    Anemia    Anxiety    Asthma    Depression    post partum   Headache    Nausea and vomiting during pregnancy 07/21/2017   Ovarian cyst    UTI (urinary tract infection)     Patient Active Problem List   Diagnosis Date Noted   Infusion reaction 07/15/2021    Past Surgical History:  Procedure Laterality Date   IUD INSERTION  11/17/2021   IUD REMOVAL  12/27/2021   NO PAST SURGERIES     TOOTH EXTRACTION      OB History     Gravida  3   Para  2   Term  2   Preterm  0   AB  1   Living  2      SAB  0   IAB  1   Ectopic  0   Multiple  0   Live Births  2            Home Medications    Prior to Admission medications   Medication Sig Start Date End Date Taking? Authorizing Provider  fluconazole (DIFLUCAN) 150 MG tablet Take 1 tablet (150 mg total) by mouth every 3 (three) days for 2 doses. 08/07/23 08/11/23 Yes Zenia Resides, MD  albuterol (VENTOLIN HFA) 108 (90 Base) MCG/ACT inhaler Inhale into the lungs. 12/11/20   [provider]  Boric Acid Vaginal 600 MG SUPP Place 1 suppository vaginally at bedtime as needed. 04/10/23   Wallis Bamberg, PA-C  Doxylamine-Pyridoxine 10-10 MG TBEC Take 1 tablet by mouth daily. 02/09/21   [provider]  loratadine (CLARITIN) 10 MG tablet Take 10 mg by mouth daily. Patient not taking: Reported on 08/07/2023 08/24/19   [provider]  Multiple Vitamin (MULTI-VITAMIN) tablet Take 1 tablet by mouth daily. 08/24/19   [provider]    Family History Family History  Problem Relation Age of Onset   Heart disease Mother    Stroke Mother    Healthy Father    Diabetes Maternal Aunt    Diabetes Maternal Uncle    Hypertension Maternal Grandmother    Diabetes Maternal Grandmother    Diabetes Maternal Grandfather     Social History Social History   Tobacco Use   Smoking status: Never    Passive exposure: Never   Smokeless tobacco: Never  Vaping Use   Vaping status: Never Used  Substance Use Topics   Alcohol use: No   Drug use: No     Allergies   Bee venom, Metronidazole, and Wasp venom   Review of Systems Review of Systems   Physical Exam Triage Vital Signs ED Triage  Vitals  Encounter Vitals Group     BP 08/07/23 1056 109/65     Systolic BP Percentile --      Diastolic BP Percentile --      Pulse Rate 08/07/23 1056 81     Resp 08/07/23 1056 20     Temp 08/07/23 1056 98 F (36.7 C)     Temp Source 08/07/23 1056 Oral     SpO2 08/07/23 1056 98 %     Weight --      Height --      Head Circumference --      Peak Flow --      Pain Score 08/07/23 1121 0     Pain Loc --      Pain Education --      Exclude from Growth Chart --    No data found.  Updated Vital Signs BP 109/65 (BP Location: Left Arm)   Pulse 81   Temp 98 F (36.7 C) (Oral)   Resp 20   LMP 07/05/2023 (Approximate)   SpO2 98%   Visual Acuity Right Eye Distance:   Left Eye Distance:   Bilateral Distance:    Right Eye Near:   Left Eye Near:    Bilateral Near:     Physical Exam Vitals reviewed.  Constitutional:      General: She is not in acute distress.    Appearance: She is not ill-appearing, toxic-appearing or diaphoretic.  HENT:     Mouth/Throat:      Mouth: Mucous membranes are moist.  Cardiovascular:     Rate and Rhythm: Normal rate and regular rhythm.     Heart sounds: No murmur heard. Pulmonary:     Effort: Pulmonary effort is normal.     Breath sounds: Normal breath sounds.  Abdominal:     Palpations: Abdomen is soft.     Tenderness: There is no abdominal tenderness.  Skin:    Coloration: Skin is not jaundiced or pale.  Neurological:     General: No focal deficit present.     Mental Status: She is alert and oriented to person, place, and time.  Psychiatric:        Behavior: Behavior normal.      UC Treatments / Results  Labs (all labs ordered are listed, but only abnormal results are displayed) Labs Reviewed  CERVICOVAGINAL ANCILLARY ONLY    EKG   Radiology No results found.  Procedures Procedures (including critical care time)  Medications Ordered in UC Medications - No data to display  Initial Impression / Assessment and Plan / UC Course  I have reviewed the triage vital signs and the nursing notes.  Pertinent labs & imaging results that were available during my care of the patient were reviewed by me and considered in my medical decision making (see chart for details).     Vaginal self swab is done, and we will notify of any positives on that and treat per protocol. Fluconazole was sent in to treat empirically for yeast vaginitis.  I discussed with her that if she wants to she could have her partner do topical clotrimazole while she takes medicine for yeast. Final Clinical Impressions(s) / UC Diagnoses   Final diagnoses:  Acute vaginitis     Discharge Instructions      Staff will notify you if there is anything positive on the swab.  Take fluconazole 150 mg--1 tablet every 3 days for 2 doses      ED Prescriptions  Medication Sig Dispense Auth. Provider   fluconazole (DIFLUCAN) 150 MG tablet Take 1 tablet (150 mg total) by mouth every 3 (three) days for 2 doses. 2 tablet Marlinda Mike  Janace Aris, MD      PDMP not reviewed this encounter.   Zenia Resides, MD 08/07/23 1125

## 2023-08-07 NOTE — ED Triage Notes (Signed)
 Seen by provider prior to this nurse

## 2023-08-07 NOTE — Discharge Instructions (Addendum)
 Staff will notify you if there is anything positive on the swab.  Take fluconazole 150 mg--1 tablet every 3 days for 2 doses

## 2023-08-09 LAB — CERVICOVAGINAL ANCILLARY ONLY
Bacterial Vaginitis (gardnerella): NEGATIVE
Candida Glabrata: NEGATIVE
Candida Vaginitis: POSITIVE — AB
Chlamydia: NEGATIVE
Comment: NEGATIVE
Comment: NEGATIVE
Comment: NEGATIVE
Comment: NEGATIVE
Comment: NEGATIVE
Comment: NORMAL
Neisseria Gonorrhea: NEGATIVE
Trichomonas: NEGATIVE

## 2023-08-10 ENCOUNTER — Ambulatory Visit
Admission: EM | Admit: 2023-08-10 | Discharge: 2023-08-10 | Disposition: A | Discharge status: Home / Self Care | Payer: MEDICAID | Attending: Family Medicine | Admitting: Family Medicine

## 2023-08-10 DIAGNOSIS — H1033 Unspecified acute conjunctivitis, bilateral: Secondary | ICD-10-CM | POA: Diagnosis not present

## 2023-08-10 MED ORDER — GENTAMICIN SULFATE 0.3 % OP SOLN: 2.0000 [drp] | Freq: Three times a day (TID) | OPHTHALMIC | 0 refills | Status: AC

## 2023-08-10 NOTE — Discharge Instructions (Addendum)
 Erythromycin ointment in both eyes 4 times daily for 5 days.  Cool compresses help.

## 2023-08-10 NOTE — ED Provider Notes (Signed)
 [name]    CSN: [id number] Arrival date & time: 08/10/23  1808      History   Chief Complaint Chief Complaint  Patient presents with   Eye Problem    HPI Valerie Newman is a 23 y.o. female.    Eye Problem Here for eye redness and irritation and eye discharge.  Is been bothering her for about 2 days.  She has had a little cough for few days and some hoarseness but that is actually getting better.  No fever  She has trouble taking metronidazole which makes her have nausea and vomiting.  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   Allergy to pollen    Anemia    Anxiety    Asthma    Depression    post partum   Headache    Nausea and vomiting during pregnancy 07/21/2017   Ovarian cyst    UTI (urinary tract infection)     Patient Active Problem List   Diagnosis Date Noted   Infusion reaction 07/15/2021    Past Surgical History:  Procedure Laterality Date   IUD INSERTION  11/17/2021   IUD REMOVAL  12/27/2021   NO PAST SURGERIES     TOOTH EXTRACTION      OB History     Gravida  3   Para  2   Term  2   Preterm  0   AB  1   Living  2      SAB  0   IAB  1   Ectopic  0   Multiple  0   Live Births  2            Home Medications    Prior to Admission medications   Medication Sig Start Date End Date Taking? Authorizing Provider  gentamicin (GARAMYCIN) 0.3 % ophthalmic solution Place 2 drops into both eyes 3 (three) times daily for 5 days. 08/10/23 08/15/23 Yes Kerah Hardebeck, Janace Aris, MD  UNABLE TO FIND Med Name: [employer] Drops   Yes [provider]  albuterol (VENTOLIN HFA) 108 (90 Base) MCG/ACT inhaler Inhale into the lungs. 12/11/20   [provider]  Boric Acid Vaginal 600 MG SUPP Place 1 suppository vaginally at bedtime as needed. 04/10/23   Wallis Bamberg, PA-C  Doxylamine-Pyridoxine 10-10 MG TBEC Take 1 tablet by mouth daily. 02/09/21   [provider]  fluconazole (DIFLUCAN) 150 MG tablet Take 1 tablet (150 mg total) by mouth every 3 (three) days for 2 doses. 08/07/23 08/11/23  Zenia Resides, MD  loratadine (CLARITIN) 10 MG tablet Take 10 mg by mouth daily. Patient not taking: Reported on 08/07/2023 08/24/19   [provider]  Multiple Vitamin (MULTI-VITAMIN) tablet Take 1 tablet by mouth daily. 08/24/19   [provider]    Family History Family History  Problem Relation Age of Onset   Heart disease Mother    Stroke Mother    Healthy Father    Diabetes Maternal Aunt    Diabetes Maternal Uncle    Hypertension Maternal Grandmother    Diabetes Maternal Grandmother    Diabetes Maternal Grandfather     Social History Social History   Tobacco Use   Smoking status: Never    Passive exposure: Never   Smokeless tobacco: Never  Vaping Use   Vaping status: Never Used  Substance Use Topics   Alcohol use: No  Drug use: No     Allergies   Bee venom, Metronidazole, and Wasp venom   Review of Systems Review of Systems   Physical Exam Triage Vital Signs ED Triage Vitals  Encounter Vitals Group     BP 08/10/23 1816 104/70     Systolic BP Percentile --      Diastolic BP Percentile --      Pulse Rate 08/10/23 1816 92     Resp 08/10/23 1816 18     Temp 08/10/23 1816 98 F (36.7 C)     Temp Source 08/10/23 1816 Oral     SpO2 08/10/23 1816 96 %     Weight 08/10/23 1814 126 lb 1.7 oz (57.2 kg)     Height 08/10/23 1814 5\' 3"  (1.6 m)     Head Circumference --      Peak Flow --      Pain Score 08/10/23 1810 2     Pain Loc --      Pain Education --      Exclude from Growth Chart --    No data found.  Updated Vital Signs BP 104/70 (BP Location: Right Arm)   Pulse 92   Temp 98 F (36.7 C) (Oral)   Resp 18   Ht 5\' 3"  (1.6 m)   Wt 57.2 kg   LMP 07/26/2023 (Approximate)   SpO2 96%   BMI 22.34 kg/m   Visual Acuity Right Eye Distance: 20/25 (Uncorrected) Left Eye Distance: 20/30  (Uncorrected) Bilateral Distance: 20/25 (Uncorrected)  Right Eye Near:   Left Eye Near:    Bilateral Near:     Physical Exam Constitutional:      General: She is not in acute distress.    Appearance: She is not toxic-appearing.  HENT:     Mouth/Throat:     Mouth: Mucous membranes are moist.  Eyes:     Extraocular Movements: Extraocular movements intact.     Pupils: Pupils are equal, round, and reactive to light.     Comments: Bilaterally the conjunctiva are injected and she has yellow discharge at the inner canthi bilaterally.  Cardiovascular:     Rate and Rhythm: Normal rate and regular rhythm.     Heart sounds: No murmur heard. Pulmonary:     Effort: Pulmonary effort is normal.     Breath sounds: Normal breath sounds.  Musculoskeletal:     Cervical back: Neck supple.  Lymphadenopathy:     Cervical: No cervical adenopathy.  Skin:    Coloration: Skin is not jaundiced or pale.  Neurological:     General: No focal deficit present.     Mental Status: She is alert and oriented to person, place, and time.  Psychiatric:        Behavior: Behavior normal.      UC Treatments / Results  Labs (all labs ordered are listed, but only abnormal results are displayed) Labs Reviewed - No data to display  EKG   Radiology No results found.  Procedures Procedures (including critical care time)  Medications Ordered in UC Medications - No data to display  Initial Impression / Assessment and Plan / UC Course  I have reviewed the triage vital signs and the nursing notes.  Pertinent labs & imaging results that were available during my care of the patient were reviewed by me and considered in my medical decision making (see chart for details).     Erythromycin ointment sample is provided here(she was going to have trouble getting the medication  at the pharmacy today). One dose applied here in the clinic.  Since she was unsure if she could accomplish the ointment application 4  times daily with her small kids at home, I did go ahead and send in [employer] and she will pick that up at the pharmacy if the erythromycin is not doable Final Clinical Impressions(s) / UC Diagnoses   Final diagnoses:  Acute conjunctivitis of both eyes, unspecified acute conjunctivitis type     Discharge Instructions      Erythromycin ointment in both eyes 4 times daily for 5 days.  Cool compresses help.       ED Prescriptions     Medication Sig Dispense Auth. Provider   gentamicin (GARAMYCIN) 0.3 % ophthalmic solution Place 2 drops into both eyes 3 (three) times daily for 5 days. 5 mL Zenia Resides, MD      PDMP not reviewed this encounter.   Zenia Resides, MD 08/10/23 [phone]

## 2023-08-10 NOTE — ED Triage Notes (Signed)
"  I am having red eyes, itchy eyes, red, drainage in both eyes, first noticed just after my daughter having the same". No fever. No injury.

## 2023-08-27 ENCOUNTER — Encounter: Payer: Self-pay | Admitting: Emergency Medicine

## 2023-08-27 ENCOUNTER — Ambulatory Visit
Admission: EM | Admit: 2023-08-27 | Discharge: 2023-08-27 | Discharge status: Against Medical Advice | Payer: MEDICAID | Attending: Internal Medicine | Admitting: Internal Medicine

## 2023-08-27 NOTE — ED Triage Notes (Signed)
 Pt reports missed menstrual period. LMP: 2/10-2/14. Pt reports she has never had a late cycle outside of her last pregnancy. Had a negative pregnancy test on 3/10. Also reports white vaginal discharge x2 days. No itching or dysuria. Pt reports hx of yeast infections and states this discharge is similar to last time. Denies abdominal pain, urinary frequency and urgency.

## 2023-09-09 ENCOUNTER — Ambulatory Visit
Admission: EM | Admit: 2023-09-09 | Discharge: 2023-09-09 | Disposition: A | Discharge status: Home / Self Care | Payer: MEDICAID | Attending: Family Medicine | Admitting: Family Medicine

## 2023-09-09 DIAGNOSIS — N760 Acute vaginitis: Secondary | ICD-10-CM | POA: Diagnosis not present

## 2023-09-09 DIAGNOSIS — J302 Other seasonal allergic rhinitis: Secondary | ICD-10-CM | POA: Diagnosis not present

## 2023-09-09 MED ORDER — CETIRIZINE HCL 10 MG PO TABS: 10.0000 mg | ORAL_TABLET | Freq: Every day | ORAL | 2 refills | Status: DC

## 2023-09-09 MED ORDER — FLUCONAZOLE 150 MG PO TABS: 150.0000 mg | ORAL_TABLET | Freq: Once | ORAL | 0 refills | Status: AC

## 2023-09-09 MED ORDER — FLUTICASONE PROPIONATE 50 MCG/ACT NA SUSP: 2.0000 | Freq: Every day | NASAL | 2 refills | Status: DC

## 2023-09-09 NOTE — ED Provider Notes (Signed)
 [facility]    CSN: [id number] Arrival date & time: 09/09/23  0910      History   Chief Complaint Chief Complaint  Patient presents with   Nasal Congestion    HPI [facility] is a 23 y.o. female.   The history is provided by the patient.  Vaginal irritation and white vaginal discharge states she is prone to yeast infection.  Denies genital rash, itching or burning, dysuria, frequency, urgency, hematuria.  Also complaining of nasal congestion x 2 weeks associated with clear rhinorrhea.  Took an over-the-counter allergy medicine made her sleepy.  Denies ear pain, or throat, fever, chills, sweats, nausea, vomiting.  Admits history of seasonal allergies.  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   Allergy to pollen    Anemia    Anxiety    Asthma    Depression    post partum   Headache    Nausea and vomiting during pregnancy 07/21/2017   Ovarian cyst    UTI (urinary tract infection)     Patient Active Problem List   Diagnosis Date Noted   Infusion reaction 07/15/2021    Past Surgical History:  Procedure Laterality Date   IUD INSERTION  11/17/2021   IUD REMOVAL  12/27/2021   NO PAST SURGERIES     TOOTH EXTRACTION      OB History     Gravida  3   Para  2   Term  2   Preterm  0   AB  1   Living  2      SAB  0   IAB  1   Ectopic  0   Multiple  0   Live Births  2            Home Medications    Prior to Admission medications   Medication Sig Start Date End Date Taking? Authorizing Provider  cetirizine (ZYRTEC) 10 MG tablet Take 1 tablet (10 mg total) by mouth daily. 09/09/23 12/08/23 Yes Meliton Rattan, PA  fluconazole (DIFLUCAN) 150 MG tablet Take 1 tablet (150 mg total) by mouth once for 1 dose. 09/09/23 09/09/23 Yes Montia Haslip, Marylene Land, PA  fluticasone (FLONASE) 50 MCG/ACT nasal spray Place 2 sprays into both nostrils daily. 09/09/23 12/08/23 Yes Meliton Rattan, PA   albuterol (VENTOLIN HFA) 108 (90 Base) MCG/ACT inhaler Inhale into the lungs. Patient not taking: Reported on 08/27/2023 12/11/20   [provider]  Boric Acid Vaginal 600 MG SUPP Place 1 suppository vaginally at bedtime as needed. Patient not taking: Reported on 08/27/2023 04/10/23   Wallis Bamberg, PA-C  Doxylamine-Pyridoxine 10-10 MG TBEC Take 1 tablet by mouth daily. Patient not taking: Reported on 08/27/2023 02/09/21   [provider]  loratadine (CLARITIN) 10 MG tablet Take 10 mg by mouth daily. Patient not taking: Reported on 08/07/2023 08/24/19   [provider]  Multiple Vitamin (MULTI-VITAMIN) tablet Take 1 tablet by mouth daily. Patient not taking: Reported on 08/27/2023 08/24/19   [provider]  UNABLE TO FIND Med Name: [employer] [facility] [employer]    [provider]    Family History Family History  Problem Relation Age of Onset   Heart disease Mother    Stroke Mother    Healthy Father    Diabetes Maternal Aunt    Diabetes Maternal Uncle    Hypertension Maternal Grandmother    Diabetes Maternal Grandmother  Diabetes Maternal Grandfather     Social History Social History   Tobacco Use   Smoking status: Never    Passive exposure: Never   Smokeless tobacco: Never  Vaping Use   Vaping status: Never Used  Substance Use Topics   Alcohol use: No   Drug use: No     Allergies   Bee venom, Metronidazole, and Wasp venom   Review of Systems Review of Systems   Physical Exam Triage Vital Signs ED Triage Vitals  Encounter Vitals Group     BP 09/09/23 0918 109/69     Systolic BP Percentile --      Diastolic BP Percentile --      Pulse Rate 09/09/23 0918 89     Resp 09/09/23 0918 16     Temp 09/09/23 0918 98.3 F (36.8 C)     Temp Source 09/09/23 0918 Oral     SpO2 09/09/23 0918 97 %     Weight --      Height --      Head Circumference --      Peak Flow --      Pain Score 09/09/23 0919 0     Pain Loc --      Pain  Education --      Exclude from Growth Chart --    No data found.  Updated Vital Signs BP 109/69 (BP Location: Right Arm)   Pulse 89   Temp 98.3 F (36.8 C) (Oral)   Resp 16   LMP 08/27/2023 (Exact Date)   SpO2 97%   Breastfeeding No   Visual Acuity Right Eye Distance:   Left Eye Distance:   Bilateral Distance:    Right Eye Near:   Left Eye Near:    Bilateral Near:     Physical Exam Vitals and nursing note reviewed.  Constitutional:      Appearance: She is not ill-appearing.  HENT:     Head: Normocephalic and atraumatic.     Right Ear: Tympanic membrane and ear canal normal.     Left Ear: Tympanic membrane and ear canal normal.     Nose: Congestion and rhinorrhea present.     Comments: Bilateral naris are completely occluded with clear rhinorrhea Eyes:     General:        Right eye: No discharge.        Left eye: No discharge.     Conjunctiva/sclera: Conjunctivae normal.  Cardiovascular:     Rate and Rhythm: Normal rate and regular rhythm.     Heart sounds: Normal heart sounds. No murmur heard. Pulmonary:     Effort: Pulmonary effort is normal. No respiratory distress.     Breath sounds: No wheezing or rhonchi.  Abdominal:     Palpations: Abdomen is soft.     Tenderness: There is no abdominal tenderness.  Musculoskeletal:     Cervical back: Neck supple.  Lymphadenopathy:     Cervical: No cervical adenopathy.  Neurological:     Mental Status: She is alert and oriented to person, place, and time.      UC Treatments / Results  Labs (all labs ordered are listed, but only abnormal results are displayed) Labs Reviewed  CERVICOVAGINAL ANCILLARY ONLY    EKG   Radiology No results found.  Procedures Procedures (including critical care time)  Medications Ordered in UC Medications - No data to display  Initial Impression / Assessment and Plan / UC Course  I have reviewed the triage vital signs and  the nursing notes.  Pertinent labs & imaging results  that were available during my care of the patient were reviewed by me and considered in my medical decision making (see chart for details).     23 year old female with vaginal irritation and white vaginal discharge prone to yeast infections.  Also complaining of nasal congestion and clear rhinorrhea has a history of seasonal allergies.  Insistent with allergic rhinitis will treat with Flonase and Zyrtec, will treat for yeast pending results of cytology screening Final Clinical Impressions(s) / UC Diagnoses   Final diagnoses:  Acute vaginitis  Seasonal allergic rhinitis, unspecified trigger     Discharge Instructions      Test results will be released to your MyChart account We will contact you if anything is positive and requires treatment.    ED Prescriptions     Medication Sig Dispense Auth. Provider   fluticasone (FLONASE) 50 MCG/ACT nasal spray Place 2 sprays into both nostrils daily. 18.2 mL Meliton Rattan, PA   cetirizine (ZYRTEC) 10 MG tablet Take 1 tablet (10 mg total) by mouth daily. 30 tablet Meliton Rattan, PA   fluconazole (DIFLUCAN) 150 MG tablet Take 1 tablet (150 mg total) by mouth once for 1 dose. 1 tablet Meliton Rattan, [place]      PDMP not reviewed this encounter.   Meliton Rattan, [place] 09/09/23 [phone]

## 2023-09-09 NOTE — ED Triage Notes (Signed)
 Pt states nasal congestion for the past 2 weeks.  Also states she is having white vaginal discharge and irritation.

## 2023-09-09 NOTE — Discharge Instructions (Signed)
 Test results will be released to your MyChart account We will contact you if anything is positive and requires treatment.

## 2023-09-10 LAB — CERVICOVAGINAL ANCILLARY ONLY
Bacterial Vaginitis (gardnerella): NEGATIVE
Candida Glabrata: NEGATIVE
Candida Vaginitis: POSITIVE — AB
Chlamydia: NEGATIVE
Comment: NEGATIVE
Comment: NEGATIVE
Comment: NEGATIVE
Comment: NEGATIVE
Comment: NEGATIVE
Comment: NORMAL
Neisseria Gonorrhea: NEGATIVE
Trichomonas: NEGATIVE

## 2023-10-02 ENCOUNTER — Other Ambulatory Visit: Payer: Self-pay

## 2023-10-02 ENCOUNTER — Encounter: Payer: Self-pay | Admitting: *Deleted

## 2023-10-02 ENCOUNTER — Ambulatory Visit
Admission: EM | Admit: 2023-10-02 | Discharge: 2023-10-02 | Disposition: A | Discharge status: Home / Self Care | Payer: MEDICAID | Attending: Family Medicine | Admitting: Family Medicine

## 2023-10-02 DIAGNOSIS — N898 Other specified noninflammatory disorders of vagina: Secondary | ICD-10-CM | POA: Diagnosis present

## 2023-10-02 MED ORDER — FLUCONAZOLE 150 MG PO TABS: ORAL_TABLET | ORAL | 0 refills | Status: DC

## 2023-10-02 NOTE — Discharge Instructions (Addendum)
We have sent testing for sexually transmitted infections. We will notify you of any positive results once they are received. If required, we will prescribe any medications you might need. °

## 2023-10-02 NOTE — ED Triage Notes (Signed)
 Vaginal discharge, irritation and dryness x 1 week

## 2023-10-02 NOTE — ED Provider Notes (Signed)
 [facility]   [id number] 10/02/23 Arrival Time: 4098  ASSESSMENT & PLAN:  1. Vaginal discharge   2. Vaginal itching    Meds ordered this encounter  Medications   fluconazole  (DIFLUCAN ) 150 MG tablet    Sig: Take one tablet by mouth as a single dose. May repeat in 3 days if symptoms persist.    Dispense:  2 tablet    Refill:  0      Discharge Instructions      We have sent testing for sexually transmitted infections. We will notify you of any positive results once they are received. If required, we will prescribe any medications you might need.      Without s/s of PID.  Labs Reviewed  CERVICOVAGINAL ANCILLARY ONLY   Will notify of any positive results. Instructed to refrain from sexual activity for at least seven days.  Reviewed expectations re: course of current medical issues. Questions answered. Outlined signs and symptoms indicating need for more acute intervention. Patient verbalized understanding. After Visit Summary given.   SUBJECTIVE:  Valerie Newman is a 23 y.o. female who presents with complaint of vaginal irritation/itching; now with vaginal dryness. Would also like STI testing.  Patient's last menstrual period was 09/24/2023 (exact date).   OBJECTIVE:  Vitals:   10/02/23 0841  BP: 97/63  Pulse: 79  Resp: 16  Temp: 97.9 F (36.6 C)  TempSrc: Oral  SpO2: 97%     General appearance: alert, cooperative, appears stated age and no distress GU: deferred Skin: warm and dry Psychological: alert and cooperative; normal mood and affect.    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   Allergy to pollen    Anemia    Anxiety    Asthma    Depression    post partum   Headache    Nausea and  vomiting during pregnancy 07/21/2017   Ovarian cyst    UTI (urinary tract infection)    Family History  Problem Relation Age of Onset   Heart disease Mother    Stroke Mother    Healthy Father    Diabetes Maternal Aunt    Diabetes Maternal Uncle    Hypertension Maternal Grandmother    Diabetes Maternal Grandmother    Diabetes Maternal Grandfather    Social History   Socioeconomic History   Marital status: Single    Spouse name: Not on file   Number of children: 1   Years of education: Not on file   Highest education level: High school graduate  Occupational History   Not on file  Tobacco Use   Smoking status: Never    Passive exposure: Never   Smokeless tobacco: Never  Vaping Use   Vaping status: Never Used  Substance and Sexual Activity   Alcohol use: No   Drug use: No   Sexual activity: Yes    Birth control/protection: None  Other Topics Concern   Not on file  Social History Narrative   Not on file   Social Drivers of Health   Financial Resource Strain: Low Risk  (03/25/2023)   Received from [facility]   Overall Financial Resource Strain (CARDIA)    Difficulty of Paying Living Expenses: Not hard at  all  Food Insecurity: No Food Insecurity (03/25/2023)   Received from [facility]   Hunger Vital Sign    Worried About Running Out of Food in the Last Year: Never true    Ran Out of Food in the Last Year: Never true  Transportation Needs: No Transportation Needs (03/25/2023)   Received from [facility]   PRAPARE - Transportation    Lack of Transportation (Medical): No    Lack of Transportation (Non-Medical): No  Physical Activity: Not on file  Stress: Not on file  Social Connections: Unknown (03/17/2023)   Received from [facility]   Social Network    Social Network: Not on file  Intimate Partner Violence: Unknown (03/17/2023)   Received from [facility]   HITS    Physically Hurt: Not on file    Insult or Talk Down To: Not on file    Threaten  Physical Harm: Not on file    Scream or Curse: Not on file           Afton Albright, MD 10/02/23 [phone]

## 2023-10-04 LAB — CERVICOVAGINAL ANCILLARY ONLY
Bacterial Vaginitis (gardnerella): NEGATIVE
Candida Glabrata: NEGATIVE
Candida Vaginitis: POSITIVE — AB
Chlamydia: NEGATIVE
Comment: NEGATIVE
Comment: NEGATIVE
Comment: NEGATIVE
Comment: NEGATIVE
Comment: NEGATIVE
Comment: NORMAL
Neisseria Gonorrhea: NEGATIVE
Trichomonas: NEGATIVE

## 2023-10-21 ENCOUNTER — Ambulatory Visit
Admission: EM | Admit: 2023-10-21 | Discharge: 2023-10-21 | Disposition: A | Discharge status: Home / Self Care | Payer: MEDICAID | Attending: Family Medicine | Admitting: Family Medicine

## 2023-10-21 ENCOUNTER — Encounter: Payer: Self-pay | Admitting: Emergency Medicine

## 2023-10-21 DIAGNOSIS — N898 Other specified noninflammatory disorders of vagina: Secondary | ICD-10-CM | POA: Diagnosis present

## 2023-10-21 NOTE — ED Triage Notes (Signed)
 Pt c/o white discharge with no odor x 3 days. No odor. No itching. No pain.

## 2023-10-21 NOTE — Discharge Instructions (Signed)
 We have sent testing for various causes of vaginal discharge/infections. We will notify you of any positive results once they are received. If required, we will prescribe any medications you might need.  Please refrain from all sexual activity for at least the next seven days.

## 2023-10-21 NOTE — ED Provider Notes (Signed)
 [facility]   [id number] 10/21/23 Arrival Time: 2130  ASSESSMENT & PLAN:  1. Vaginal discharge    No empiric tx.   Discharge Instructions      We have sent testing for various causes of vaginal discharge/infections. We will notify you of any positive results once they are received. If required, we will prescribe any medications you might need.  Please refrain from all sexual activity for at least the next seven days.    Without s/s of PID.  Labs Reviewed  CERVICOVAGINAL ANCILLARY ONLY    Will notify of any positive results. Instructed to refrain from sexual activity for at least seven days.  Reviewed expectations re: course of current medical issues. Questions answered. Outlined signs and symptoms indicating need for more acute intervention. Patient verbalized understanding. After Visit Summary given.   SUBJECTIVE:  Valerie Newman is a 23 y.o. female who presents with complaint of vaginal discharge; x 2 days. Desires STD testing. Denies fever/pelvic pain.  Patient's last menstrual period was 09/24/2023 (exact date).   OBJECTIVE:  Vitals:   10/21/23 1031 10/21/23 1032  BP:  98/69  Pulse:  96  Resp:  16  Temp:  98.9 F (37.2 C)  TempSrc:  Oral  SpO2:  98%  Weight: 57.2 kg     General appearance: alert, cooperative, appears stated age and no distress GU: deferred Skin: warm and dry Psychological: alert and cooperative; normal mood and affect.    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   Allergy to pollen    Anemia    Anxiety    Asthma    Depression    post partum   Headache    Nausea and vomiting during pregnancy 07/21/2017   Ovarian cyst    UTI (urinary tract infection)    Family History   Problem Relation Age of Onset   Heart disease Mother    Stroke Mother    Healthy Father    Diabetes Maternal Aunt    Diabetes Maternal Uncle    Hypertension Maternal Grandmother    Diabetes Maternal Grandmother    Diabetes Maternal Grandfather    Social History   Socioeconomic History   Marital status: Single    Spouse name: Not on file   Number of children: 1   Years of education: Not on file   Highest education level: High school graduate  Occupational History   Not on file  Tobacco Use   Smoking status: Never    Passive exposure: Never   Smokeless tobacco: Never  Vaping Use   Vaping status: Never Used  Substance and Sexual Activity   Alcohol use: No   Drug use: No   Sexual activity: Yes    Birth control/protection: None  Other Topics Concern   Not on file  Social History Narrative   Not on file   Social Drivers of Health   Financial Resource Strain: Low Risk  (03/25/2023)   Received from [facility]   Overall Financial Resource Strain (CARDIA)    Difficulty of Paying Living Expenses: Not hard at all  Food Insecurity: No Food Insecurity (03/25/2023)   Received from [facility]   Hunger Vital Sign    Worried About Running  Out of Food in the Last Year: Never true    Ran Out of Food in the Last Year: Never true  Transportation Needs: No Transportation Needs (03/25/2023)   Received from [facility]   PRAPARE - Transportation    Lack of Transportation (Medical): No    Lack of Transportation (Non-Medical): No  Physical Activity: Not on file  Stress: Not on file  Social Connections: Unknown (03/17/2023)   Received from [facility]   Social Network    Social Network: Not on file  Intimate Partner Violence: Unknown (03/17/2023)   Received from [facility]   HITS    Physically Hurt: Not on file    Insult or Talk Down To: Not on file    Threaten Physical Harm: Not on file    Scream or Curse: Not on file           Afton Albright, MD 10/21/23  1043

## 2023-10-22 ENCOUNTER — Telehealth (HOSPITAL_COMMUNITY): Payer: Self-pay

## 2023-10-22 LAB — CERVICOVAGINAL ANCILLARY ONLY
Bacterial Vaginitis (gardnerella): NEGATIVE
Candida Glabrata: NEGATIVE
Candida Vaginitis: POSITIVE — AB
Chlamydia: NEGATIVE
Comment: NEGATIVE
Comment: NEGATIVE
Comment: NEGATIVE
Comment: NEGATIVE
Comment: NEGATIVE
Comment: NORMAL
Neisseria Gonorrhea: NEGATIVE
Trichomonas: NEGATIVE

## 2023-10-22 MED ORDER — FLUCONAZOLE 150 MG PO TABS: 150.0000 mg | ORAL_TABLET | Freq: Once | ORAL | 0 refills | Status: AC

## 2023-10-22 NOTE — Telephone Encounter (Signed)
 Per protocol, pt requires tx with Diflucan.  Rx sent to pharmacy on file.

## 2023-11-02 ENCOUNTER — Ambulatory Visit
Admission: EM | Admit: 2023-11-02 | Discharge: 2023-11-02 | Disposition: A | Discharge status: Home / Self Care | Payer: MEDICAID | Attending: Family Medicine | Admitting: Family Medicine

## 2023-11-02 DIAGNOSIS — N760 Acute vaginitis: Secondary | ICD-10-CM | POA: Diagnosis present

## 2023-11-02 DIAGNOSIS — Z202 Contact with and (suspected) exposure to infections with a predominantly sexual mode of transmission: Secondary | ICD-10-CM | POA: Diagnosis present

## 2023-11-02 DIAGNOSIS — N3090 Cystitis, unspecified without hematuria: Secondary | ICD-10-CM | POA: Diagnosis present

## 2023-11-02 LAB — POCT URINALYSIS DIP (MANUAL ENTRY)
Bilirubin, UA: NEGATIVE
Glucose, UA: 100 mg/dL — AB
Nitrite, UA: POSITIVE — AB
Protein Ur, POC: >=300 mg/dL — AB
Spec Grav, UA: 1.02 (ref 1.010–1.025)
Urobilinogen, UA: 2 U/dL — AB
pH, UA: 5.5 (ref 5.0–8.0)

## 2023-11-02 MED ORDER — NITROFURANTOIN MONOHYD MACRO 100 MG PO CAPS
100.0000 mg | ORAL_CAPSULE | Freq: Two times a day (BID) | ORAL | 0 refills | Status: AC
Start: 2023-11-02 — End: ?

## 2023-11-02 MED ORDER — FLUCONAZOLE 150 MG PO TABS: 150.0000 mg | ORAL_TABLET | ORAL | 0 refills | Status: AC

## 2023-11-02 NOTE — ED Triage Notes (Signed)
"  I have a UTI, I went to [facility] and bought AZO, I also think I have a yeast infection". Symptoms re: UTI "since the weekend" and "vaginal discharge (a few days)". "I also want tested for everything if possible".

## 2023-11-02 NOTE — Discharge Instructions (Addendum)
 The urinalysis did have some white blood cells and red blood cells on it  .  Staff will notify you if there is anything positive on the swab or on the bloodwork.  Take nitrofurantoin  100 mg--1 capsule 2 times daily for 5 days  Take fluconazole  150 mg--1 tablet every 3 days for 3 doses

## 2023-11-02 NOTE — ED Provider Notes (Signed)
 [name]    CSN: [id number] Arrival date & time: 11/02/23  0804      History   Chief Complaint Chief Complaint  Patient presents with   UTI Symptoms   SEXUALLY TRANSMITTED DISEASE   Vaginal Discharge    HPI Valerie Newman is a 23 y.o. female.    Vaginal Discharge Here for dysuria and urinary frequency, began about 5-7 days ago. No f/c/n/v. She   Also has had some vaginal discharge and very mild itching.  She notes she has had recurrent yeast infections and would like to know why.  Often they occur monthly after her menstrual cycle.  Last menstrual cycle was May 8.  She is allergic to metronidazole --it causes nausea and vomiting, but no rash.  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   Allergy to pollen    Anemia    Anxiety    Asthma    Depression    post partum   Headache    Nausea and vomiting during pregnancy 07/21/2017   Ovarian cyst    UTI (urinary tract infection)     Patient Active Problem List   Diagnosis Date Noted   Infusion reaction 07/15/2021    Past Surgical History:  Procedure Laterality Date   IUD INSERTION  11/17/2021   IUD REMOVAL  12/27/2021   NO PAST SURGERIES     TOOTH EXTRACTION      OB History     Gravida  3   Para  2   Term  2   Preterm  0   AB  1   Living  2      SAB  0   IAB  1   Ectopic  0   Multiple  0   Live Births  2            Home Medications    Prior to Admission medications   Medication Sig Start Date End Date Taking? Authorizing Provider  fluconazole  (DIFLUCAN ) 150 MG tablet Take 1 tablet (150 mg total) by mouth every 3 (three) days for 3 doses. 11/02/23 11/09/23 Yes Trystian Crisanto, Paige Boatman, MD  nitrofurantoin , macrocrystal-monohydrate, (MACROBID ) 100 MG capsule Take 1 capsule (100 mg total) by mouth 2 (two) times daily. 11/02/23  Yes Ann Keto, MD   phenazopyridine  (PYRIDIUM ) 97 MG tablet Take 97 mg by mouth 3 (three) times daily as needed for pain.   Yes [provider]  cetirizine  (ZYRTEC ) 10 MG tablet Take 1 tablet (10 mg total) by mouth daily. 09/09/23 12/08/23  Acevedo, Angela, PA  fluticasone  (FLONASE ) 50 MCG/ACT nasal spray Place 2 sprays into both nostrils daily. 09/09/23 12/08/23  Acevedo, Angela, PA  UNABLE TO FIND Med Name: [employer] [facility] [employer]    [provider]    Family History Family History  Problem Relation Age of Onset   Heart disease Mother    Stroke Mother    Healthy Father    Diabetes Maternal Aunt    Diabetes Maternal Uncle    Hypertension Maternal Grandmother    Diabetes Maternal Grandmother    Diabetes Maternal Grandfather     Social History Social History   Tobacco Use   Smoking status: Never    Passive exposure: Never   Smokeless tobacco: Never  Vaping Use   Vaping status: Never Used  Substance  Use Topics   Alcohol use: No   Drug use: No     Allergies   Bee venom, Metronidazole , and Wasp venom   Review of Systems Review of Systems  Genitourinary:  Positive for vaginal discharge.     Physical Exam Triage Vital Signs ED Triage Vitals  Encounter Vitals Group     BP 11/02/23 0821 99/66     Systolic BP Percentile --      Diastolic BP Percentile --      Pulse Rate 11/02/23 0821 81     Resp 11/02/23 0821 18     Temp 11/02/23 0821 98.2 F (36.8 C)     Temp Source 11/02/23 0821 Oral     SpO2 11/02/23 0821 96 %     Weight 11/02/23 0818 126 lb 1.7 oz (57.2 kg)     Height 11/02/23 0818 5\' 3"  (1.6 m)     Head Circumference --      Peak Flow --      Pain Score 11/02/23 0818 0     Pain Loc --      Pain Education --      Exclude from Growth Chart --    No data found.  Updated Vital Signs BP 99/66 (BP Location: Left Arm)   Pulse 81   Temp 98.2 F (36.8 C) (Oral)   Resp 18   Ht 5\' 3"  (1.6 m)   Wt 57.2 kg   LMP 10/21/2023 (Exact Date)   SpO2 96%   BMI 22.34  kg/m   Visual Acuity Right Eye Distance:   Left Eye Distance:   Bilateral Distance:    Right Eye Near:   Left Eye Near:    Bilateral Near:     Physical Exam Vitals reviewed.  Constitutional:      General: She is not in acute distress.    Appearance: She is not ill-appearing, toxic-appearing or diaphoretic.  HENT:     Mouth/Throat:     Mouth: Mucous membranes are moist.  Eyes:     Extraocular Movements: Extraocular movements intact.     Conjunctiva/sclera: Conjunctivae normal.     Pupils: Pupils are equal, round, and reactive to light.  Cardiovascular:     Rate and Rhythm: Normal rate and regular rhythm.     Heart sounds: No murmur heard. Pulmonary:     Effort: Pulmonary effort is normal.     Breath sounds: Normal breath sounds.  Abdominal:     Palpations: Abdomen is soft.     Tenderness: There is abdominal tenderness (suprapubic).  Musculoskeletal:     Cervical back: Neck supple.  Lymphadenopathy:     Cervical: No cervical adenopathy.  Skin:    Coloration: Skin is not jaundiced or pale.  Neurological:     General: No focal deficit present.     Mental Status: She is alert and oriented to person, place, and time.  Psychiatric:        Behavior: Behavior normal.      UC Treatments / Results  Labs (all labs ordered are listed, but only abnormal results are displayed) Labs Reviewed  POCT URINALYSIS DIP (MANUAL ENTRY) - Abnormal; Notable for the following components:      Result Value   Color, UA orange (*)    Clarity, UA cloudy (*)    Glucose, UA =100 (*)    Ketones, POC UA trace (5) (*)    Blood, UA moderate (*)    Protein Ur, POC >=300 (*)    Urobilinogen, UA  2.0 (*)    Nitrite, UA Positive (*)    Leukocytes, UA Large (3+) (*)    All other components within normal limits  URINE CULTURE  HIV ANTIBODY (ROUTINE TESTING W REFLEX)  RPR  CERVICOVAGINAL ANCILLARY ONLY    EKG   Radiology No results found.  Procedures Procedures (including critical care  time)  Medications Ordered in UC Medications - No data to display  Initial Impression / Assessment and Plan / UC Course  I have reviewed the triage vital signs and the nursing notes.  Pertinent labs & imaging results that were available during my care of the patient were reviewed by me and considered in my medical decision making (see chart for details).     Urinalysis is abnormal with nitrites, leukocytes and red blood cells.  There is a little bit of glucose.  All abnormalities could be due to her have not taken Pyridium  recently.  Urine culture is sent  Nitrofurantoin  is sent for what appears to be a cystitis along with Diflucan  for possible yeast infection.  I have asked her to follow-up with her OB/GYN about the recurrent yeast.  HIV and RPR blood work is drawn.  Vaginal self swab is done, and we will notify of any positives on that or on the blood work, and treat per protocol.  Final Clinical Impressions(s) / UC Diagnoses   Final diagnoses:  Cystitis  Acute vaginitis  Potential exposure to STD   Discharge Instructions   None    ED Prescriptions     Medication Sig Dispense Auth. Provider   nitrofurantoin , macrocrystal-monohydrate, (MACROBID ) 100 MG capsule Take 1 capsule (100 mg total) by mouth 2 (two) times daily. 10 capsule Ann Keto, MD   fluconazole  (DIFLUCAN ) 150 MG tablet Take 1 tablet (150 mg total) by mouth every 3 (three) days for 3 doses. 3 tablet Livie Vanderhoof, Paige Boatman, MD      PDMP not reviewed this encounter.   Ann Keto, MD 11/02/23 [phone]

## 2023-11-03 LAB — CERVICOVAGINAL ANCILLARY ONLY
Bacterial Vaginitis (gardnerella): NEGATIVE
Candida Glabrata: NEGATIVE
Candida Vaginitis: POSITIVE — AB
Chlamydia: NEGATIVE
Comment: NEGATIVE
Comment: NEGATIVE
Comment: NEGATIVE
Comment: NEGATIVE
Comment: NEGATIVE
Comment: NORMAL
Neisseria Gonorrhea: NEGATIVE
Trichomonas: NEGATIVE

## 2023-11-03 LAB — HIV ANTIBODY (ROUTINE TESTING W REFLEX): HIV Screen 4th Generation wRfx: NONREACTIVE

## 2023-11-03 LAB — RPR: RPR Ser Ql: NONREACTIVE

## 2023-11-04 ENCOUNTER — Ambulatory Visit (HOSPITAL_COMMUNITY): Payer: Self-pay

## 2023-11-04 LAB — URINE CULTURE: Culture: >=100000 — AB

## 2023-12-01 ENCOUNTER — Encounter: Payer: Self-pay | Admitting: Emergency Medicine

## 2023-12-01 ENCOUNTER — Ambulatory Visit
Admission: EM | Admit: 2023-12-01 | Discharge: 2023-12-01 | Disposition: A | Discharge status: Home / Self Care | Payer: MEDICAID | Attending: Nurse Practitioner | Admitting: Nurse Practitioner

## 2023-12-01 DIAGNOSIS — R1084 Generalized abdominal pain: Secondary | ICD-10-CM | POA: Diagnosis present

## 2023-12-01 DIAGNOSIS — N3001 Acute cystitis with hematuria: Secondary | ICD-10-CM

## 2023-12-01 DIAGNOSIS — M545 Low back pain, unspecified: Secondary | ICD-10-CM | POA: Insufficient documentation

## 2023-12-01 LAB — POCT URINE PREGNANCY: Preg Test, Ur: NEGATIVE

## 2023-12-01 LAB — POCT URINALYSIS DIP (MANUAL ENTRY)
Bilirubin, UA: NEGATIVE
Glucose, UA: NEGATIVE mg/dL
Ketones, POC UA: NEGATIVE mg/dL
Nitrite, UA: NEGATIVE
Protein Ur, POC: 30 mg/dL — AB
Spec Grav, UA: 1.025 (ref 1.010–1.025)
Urobilinogen, UA: 0.2 U/dL — AB
pH, UA: 7 (ref 5.0–8.0)

## 2023-12-01 MED ORDER — IBUPROFEN 600 MG PO TABS: 600.0000 mg | ORAL_TABLET | Freq: Four times a day (QID) | ORAL | 0 refills | Status: DC | PRN

## 2023-12-01 MED ORDER — PHENAZOPYRIDINE HCL 200 MG PO TABS
200.0000 mg | ORAL_TABLET | Freq: Three times a day (TID) | ORAL | 0 refills | Status: AC
Start: 2023-12-01 — End: ?

## 2023-12-01 MED ORDER — NITROFURANTOIN MONOHYD MACRO 100 MG PO CAPS: 100.0000 mg | ORAL_CAPSULE | Freq: Two times a day (BID) | ORAL | 0 refills | Status: AC

## 2023-12-01 NOTE — Discharge Instructions (Addendum)
 You have been diagnosed with a urinary tract infection (UTI). Take Macrobid  twice daily for 5 days as prescribed, and complete the full course even if symptoms improve. Pyridium  has also been prescribed to help relieve burning or discomfort during urination; take it up to three times daily for 2 days. This medication may cause your urine to turn orange, which is normal. For abdominal and back pain, you may take the prescribed Motrin  every 6 hours as needed. Do not take any other over-the-counter anti-inflammatory medications or aspirin while using motrin , as this can increase the risk of side effects. Drinking plenty of water can help flush the infection from your system. Warm compresses or warm baths may also provide relief for abdominal cramping or back discomfort. Avoid sexual activity until symptoms fully resolve. Monitor your symptoms closely--contact your primary care provider if symptoms do not improve after completing the antibiotics or if you have any questions about your treatment. Go to the emergency room if you develop fever, worsening pain, nausea or vomiting, or if you are unable to keep down fluids.

## 2023-12-01 NOTE — ED Provider Notes (Signed)
 [name]    CSN: [id number] Arrival date & time: 12/01/23  0802      History   Chief Complaint Chief Complaint  Patient presents with   Urinary Frequency   Abdominal Pain    HPI Valerie Newman is a 23 y.o. female.   Valerie Newman is a 23 y.o. female that presents with symptoms of a urinary tract infection and abdominal pain. She reports experiencing stomach pain and a change in appetite starting last week, with urinary symptoms starting this week.The patient describes lower abdominal pain that feels bloated and is constant, even without eating. She denies nausea, vomiting, or constipation. Urinary symptoms include frequent urges to urinate with minimal output, as well as pain and burning during urination. The patient also reports low back pain, which she describes as hurting in general. Hot showers provide temporary relief for her abdominal pain. The patient denies fever, chills, body aches, vaginal discharge, or weight changes. She reports being sexually active with one partner in the past 3 months and never using condoms. The patient's last menstrual period started on June 5th, and she is not currently using birth control.  The following portions of the patient's history were reviewed and updated as appropriate: allergies, current medications, past family history, past medical history, past social history, past surgical history, and problem list.      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   Allergy to pollen    Anemia    Anxiety    Asthma    Depression    post partum   Headache    Nausea and vomiting during pregnancy 07/21/2017   Ovarian cyst    UTI (urinary tract infection)     Patient Active Problem List   Diagnosis Date Noted   Infusion reaction 07/15/2021    Past Surgical History:  Procedure Laterality Date   IUD INSERTION  11/17/2021   IUD REMOVAL  12/27/2021   NO PAST SURGERIES     TOOTH EXTRACTION       OB History     Gravida  3   Para  2   Term  2   Preterm  0   AB  1   Living  2      SAB  0   IAB  1   Ectopic  0   Multiple  0   Live Births  2            Home Medications    Prior to Admission medications   Medication Sig Start Date End Date Taking? Authorizing Provider  ibuprofen  (ADVIL ) 600 MG tablet Take 1 tablet (600 mg total) by mouth every 6 (six) hours as needed (pain). 12/01/23  Yes Shavana Calder, Ova Bloomer, FNP  nitrofurantoin , macrocrystal-monohydrate, (MACROBID ) 100 MG capsule Take 1 capsule (100 mg total) by mouth 2 (two) times daily. 12/01/23  Yes Maryruth Sol, FNP  phenazopyridine  (PYRIDIUM ) 200 MG tablet Take 1 tablet (200 mg total) by mouth 3 (three) times daily. 12/01/23  Yes Maryruth Sol, FNP    Family History Family History  Problem Relation Age of Onset   Heart disease Mother    Stroke Mother    Healthy Father    Diabetes Maternal Aunt    Diabetes Maternal Uncle    Hypertension Maternal Grandmother    Diabetes Maternal Grandmother    Diabetes Maternal Grandfather     Social History Social History   Tobacco Use   Smoking  status: Never    Passive exposure: Never   Smokeless tobacco: Never  Vaping Use   Vaping status: Never Used  Substance Use Topics   Alcohol use: No   Drug use: No     Allergies   Bee venom, Metronidazole , and Wasp venom   Review of Systems Review of Systems  Constitutional:  Positive for appetite change. Negative for chills and fever.  Gastrointestinal:  Positive for abdominal distention and abdominal pain. Negative for constipation, nausea and vomiting.  Genitourinary:  Positive for dysuria, frequency and urgency. Negative for menstrual problem (LMP 11/18/23) and vaginal discharge.  Musculoskeletal:  Positive for back pain. Negative for myalgias.  All other systems reviewed and are negative.    Physical Exam Triage Vital Signs ED Triage Vitals  Encounter Vitals Group     BP 12/01/23 0825  127/88     Girls Systolic BP Percentile --      Girls Diastolic BP Percentile --      Boys Systolic BP Percentile --      Boys Diastolic BP Percentile --      Pulse Rate 12/01/23 0825 (!) 59     Resp 12/01/23 0825 16     Temp 12/01/23 0825 98.2 F (36.8 C)     Temp Source 12/01/23 0825 Oral     SpO2 12/01/23 0825 99 %     Weight 12/01/23 0824 126 lb 1.7 oz (57.2 kg)     Height --      Head Circumference --      Peak Flow --      Pain Score 12/01/23 0823 9     Pain Loc --      Pain Education --      Exclude from Growth Chart --    No data found.  Updated Vital Signs BP 127/88 (BP Location: Left Arm)   Pulse (!) 59   Temp 98.2 F (36.8 C) (Oral)   Resp 16   Wt 126 lb 1.7 oz (57.2 kg)   LMP 11/18/2023 (Exact Date)   SpO2 99%   BMI 22.34 kg/m   Visual Acuity Right Eye Distance:   Left Eye Distance:   Bilateral Distance:    Right Eye Near:   Left Eye Near:    Bilateral Near:     Physical Exam Constitutional:      General: She is not in acute distress.    Appearance: Normal appearance. She is not ill-appearing, toxic-appearing or diaphoretic.  HENT:     Head: Normocephalic.     Nose: Nose normal.     Mouth/Throat:     Mouth: Mucous membranes are moist.   Eyes:     Conjunctiva/sclera: Conjunctivae normal.    Cardiovascular:     Rate and Rhythm: Normal rate.  Pulmonary:     Effort: Pulmonary effort is normal.  Abdominal:     General: Bowel sounds are normal. There is no distension.     Palpations: Abdomen is soft.     Tenderness: There is generalized abdominal tenderness. There is no right CVA tenderness, left CVA tenderness or guarding.  Genitourinary:    Comments: Deferred  Musculoskeletal:        General: Normal range of motion.     Cervical back: Normal range of motion and neck supple.   Skin:    General: Skin is warm and dry.   Neurological:     General: No focal deficit present.     Mental Status: She is alert and oriented to  person, place,  and time.   Psychiatric:        Mood and Affect: Mood normal.        Behavior: Behavior normal.      UC Treatments / Results  Labs (all labs ordered are listed, but only abnormal results are displayed) Labs Reviewed  POCT URINALYSIS DIP (MANUAL ENTRY) - Abnormal; Notable for the following components:      Result Value   Clarity, UA cloudy (*)    Blood, UA small (*)    Protein Ur, POC =30 (*)    Urobilinogen, UA 0.2 (*)    Leukocytes, UA Moderate (2+) (*)    All other components within normal limits  POCT URINE PREGNANCY - Normal  URINE CULTURE  CBC WITH DIFFERENTIAL/PLATELET  LIPASE  COMPREHENSIVE METABOLIC PANEL WITH GFR    EKG   Radiology No results found.  Procedures Procedures (including critical care time)  Medications Ordered in UC Medications - No data to display  Initial Impression / Assessment and Plan / UC Course  I have reviewed the triage vital signs and the nursing notes.  Pertinent labs & imaging results that were available during my care of the patient were reviewed by me and considered in my medical decision making (see chart for details).       The patient presents with symptoms consistent with a urinary tract infection, including urinary frequency, urgency, and lower abdominal discomfort. Symptoms began recently, with associated lower back and stomach pain starting last week. The patient reports frequent urination with low output but denies fever, chills, body aches, nausea, vomiting, constipation, or vaginal discharge. She is sexually active with one partner and does not use condoms. Abdominal pain is described as constant and bloating in nature, relieved temporarily by hot showers, and persists regardless of food intake. The low back pain is mild and likely related to the ongoing urinary symptoms. Urinalysis supports a clinical diagnosis of UTI. The patient was prescribed Macrobid  for five days and Pyridium  for symptom relief. Motrin  was also  prescribed for abdominal and back pain. Blood work was ordered for further evaluation. The patient was advised to monitor symptoms, follow up with her primary care provider if symptoms persist, and to go to the emergency department if pain worsens, fever develops, or vomiting occurs.  Today's evaluation has revealed no signs of a dangerous process. Discussed diagnosis with patient and/or guardian. Patient and/or guardian aware of their diagnosis, possible red flag symptoms to watch out for and need for close follow up. Patient and/or guardian understands verbal and written discharge instructions. Patient and/or guardian comfortable with plan and disposition.  Patient and/or guardian has a clear mental status at this time, good insight into illness (after discussion and teaching) and has clear judgment to make decisions regarding their care  Documentation was completed with the aid of voice recognition software. Transcription may contain typographical errors. Final Clinical Impressions(s) / UC Diagnoses   Final diagnoses:  Acute cystitis with hematuria  Generalized abdominal pain  Acute bilateral low back pain without sciatica     Discharge Instructions      You have been diagnosed with a urinary tract infection (UTI). Take Macrobid  twice daily for 5 days as prescribed, and complete the full course even if symptoms improve. Pyridium  has also been prescribed to help relieve burning or discomfort during urination; take it up to three times daily for 2 days. This medication may cause your urine to turn orange, which is normal. For abdominal and back  pain, you may take the prescribed Motrin  every 6 hours as needed. Do not take any other over-the-counter anti-inflammatory medications or aspirin while using motrin , as this can increase the risk of side effects. Drinking plenty of water can help flush the infection from your system. Warm compresses or warm baths may also provide relief for abdominal  cramping or back discomfort. Avoid sexual activity until symptoms fully resolve. Monitor your symptoms closely--contact your primary care provider if symptoms do not improve after completing the antibiotics or if you have any questions about your treatment. Go to the emergency room if you develop fever, worsening pain, nausea or vomiting, or if you are unable to keep down fluids.      ED Prescriptions     Medication Sig Dispense Auth. Provider   nitrofurantoin , macrocrystal-monohydrate, (MACROBID ) 100 MG capsule Take 1 capsule (100 mg total) by mouth 2 (two) times daily. 10 capsule Maryruth Sol, FNP   phenazopyridine  (PYRIDIUM ) 200 MG tablet Take 1 tablet (200 mg total) by mouth 3 (three) times daily. 6 tablet Maryruth Sol, FNP   ibuprofen  (ADVIL ) 600 MG tablet Take 1 tablet (600 mg total) by mouth every 6 (six) hours as needed (pain). 30 tablet Maryruth Sol, FNP      PDMP not reviewed this encounter.   Beola Brazil [place], [place] 12/01/23 [phone]

## 2023-12-01 NOTE — ED Triage Notes (Signed)
 Pt presents c/o urinary frequency and lower abdominal pain/pressure x a couple days. Pt denies any additional sxs.

## 2023-12-02 LAB — CBC WITH DIFFERENTIAL/PLATELET
Basophils Absolute: 0 10*3/uL (ref 0.0–0.2)
Basos: 0 %
EOS (ABSOLUTE): 0.2 10*3/uL (ref 0.0–0.4)
Eos: 5 %
Hematocrit: 35.7 % (ref 34.0–46.6)
Hemoglobin: 10.6 g/dL — ABNORMAL LOW (ref 11.1–15.9)
Immature Grans (Abs): 0 10*3/uL (ref 0.0–0.1)
Immature Granulocytes: 0 %
Lymphocytes Absolute: 1.4 10*3/uL (ref 0.7–3.1)
Lymphs: 37 %
MCH: 24.7 pg — ABNORMAL LOW (ref 26.6–33.0)
MCHC: 29.7 g/dL — ABNORMAL LOW (ref 31.5–35.7)
MCV: 83 fL (ref 79–97)
Monocytes Absolute: 0.2 10*3/uL (ref 0.1–0.9)
Monocytes: 6 %
Neutrophils Absolute: 1.9 10*3/uL (ref 1.4–7.0)
Neutrophils: 52 %
Platelets: 291 10*3/uL (ref 150–450)
RBC: 4.3 x10E6/uL (ref 3.77–5.28)
RDW: 13.8 % (ref 11.7–15.4)
WBC: 3.6 10*3/uL (ref 3.4–10.8)

## 2023-12-02 LAB — COMPREHENSIVE METABOLIC PANEL WITH GFR
ALT: 13 IU/L (ref 0–32)
AST: 24 IU/L (ref 0–40)
Albumin: 4.5 g/dL (ref 4.0–5.0)
Alkaline Phosphatase: 57 IU/L (ref 44–121)
BUN/Creatinine Ratio: 13 (ref 9–23)
BUN: 9 mg/dL (ref 6–20)
Bilirubin Total: 0.5 mg/dL (ref 0.0–1.2)
CO2: 22 mmol/L (ref 20–29)
Calcium: 9.2 mg/dL (ref 8.7–10.2)
Chloride: 105 mmol/L (ref 96–106)
Creatinine, Ser: 0.67 mg/dL (ref 0.57–1.00)
Globulin, Total: 2.6 g/dL (ref 1.5–4.5)
Glucose: 81 mg/dL (ref 70–99)
Potassium: 4.1 mmol/L (ref 3.5–5.2)
Sodium: 142 mmol/L (ref 134–144)
Total Protein: 7.1 g/dL (ref 6.0–8.5)
eGFR: 125 mL/min/{1.73_m2} (ref >59)

## 2023-12-02 LAB — URINE CULTURE: Culture: NO GROWTH

## 2023-12-02 LAB — LIPASE: Lipase: 18 U/L (ref 14–72)

## 2023-12-03 ENCOUNTER — Ambulatory Visit: Payer: Self-pay | Admitting: Nurse Practitioner

## 2023-12-14 ENCOUNTER — Ambulatory Visit
Admission: EM | Admit: 2023-12-14 | Discharge: 2023-12-14 | Disposition: A | Discharge status: Home / Self Care | Payer: MEDICAID | Attending: Family Medicine | Admitting: Family Medicine

## 2023-12-14 DIAGNOSIS — G44209 Tension-type headache, unspecified, not intractable: Secondary | ICD-10-CM

## 2023-12-14 DIAGNOSIS — R42 Dizziness and giddiness: Secondary | ICD-10-CM

## 2023-12-14 MED ORDER — IBUPROFEN 800 MG PO TABS
800.0000 mg | ORAL_TABLET | Freq: Once | ORAL | Status: AC
  Administered 2023-12-14: 800 mg via ORAL

## 2023-12-14 NOTE — Discharge Instructions (Addendum)
 You can continue using tylenol  or ibuprofen  over the counter for your headache   You can also pick up an iron  supplement over the counter to treat iron  deficiency - I recommend following up with a primary care physician for this  We will check some bloodwork today and will let you know if anything is abnormal  Please try to avoid locking your knees when you stand especially if standing for a long time Please seek medical attention if your symptoms worsen or you develop severe nausea/vomiting, fevers, changes in your vision, or numbness/weakness

## 2023-12-14 NOTE — ED Triage Notes (Signed)
 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 Some blurriness with vision (far away) at times now that I haven't had before.

## 2023-12-14 NOTE — ED Provider Notes (Addendum)
 [name]    CSN: [id number] Arrival date & time: 12/14/23  1623      History   Chief Complaint Chief Complaint  Patient presents with   Dizziness   Headache    HPI Valerie Newman is a 23 y.o. female.   While at work today, experienced lightheadedness and sensation that she was going to pass out. States that she went blank for about 2 minutes and felt off balance with blurry vision.  Since then has had a frontal 7/10 headache, throbbing, not photosensitive, but did have some nausea - no vomiting  Had similar symptoms a few years ago when she was diagnosed with anemia. Does not take any iron  supplementation  Currently still endorses headache but after resting for a bit her lightheadedness has improved. Feels that if she stands up her lightheadedness will return  Has not tried any medication for headache yet  Denies numbness/weakness in extremities, fevers, CP/SOB abdominal pain. 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   Allergy to pollen    Anemia    Anxiety    Asthma    Depression    post partum   Headache    Nausea and vomiting during pregnancy 07/21/2017   Ovarian cyst    UTI (urinary tract infection)     Patient Active Problem List   Diagnosis Date Noted   Infusion reaction 07/15/2021    Past Surgical History:  Procedure Laterality Date   IUD INSERTION  11/17/2021   IUD REMOVAL  12/27/2021   NO PAST SURGERIES     TOOTH EXTRACTION      OB History     Gravida  3   Para  2   Term  2   Preterm  0   AB  1   Living  2      SAB  0   IAB  1   Ectopic  0   Multiple  0   Live Births   2            Home Medications    Prior to Admission medications   Medication Sig Start Date End Date Taking? Authorizing Provider  ibuprofen  (ADVIL ) 600 MG tablet Take 1 tablet (600 mg total) by mouth every 6 (six) hours as needed (pain). 12/01/23   Iola Lukes, FNP  nitrofurantoin , macrocrystal-monohydrate, (MACROBID ) 100 MG capsule Take 1 capsule (100 mg total) by mouth 2 (two) times daily. 12/01/23   Murrill, Samantha, FNP  phenazopyridine  (PYRIDIUM ) 200 MG tablet Take 1 tablet (200 mg total) by mouth 3 (three) times daily. 12/01/23   Iola Lukes, FNP    Family History Family History  Problem Relation Age of Onset   Heart disease Mother    Stroke Mother    Healthy Father    Diabetes Maternal Aunt    Diabetes Maternal Uncle    Hypertension Maternal Grandmother    Diabetes Maternal Grandmother    Diabetes Maternal Grandfather     Social History Social History   Tobacco Use   Smoking status: Never    Passive exposure: Never   Smokeless tobacco: Never  Vaping Use  Vaping status: Never Used  Substance Use Topics   Alcohol use: No   Drug use: No     Allergies   Bee venom, Metronidazole , and Wasp venom   Review of Systems Review of Systems  Neurological:  Positive for dizziness and headaches.  Remainder per HPI   Physical Exam Triage Vital Signs ED Triage Vitals  Encounter Vitals Group     BP --      Girls Systolic BP Percentile --      Girls Diastolic BP Percentile --      Boys Systolic BP Percentile --      Boys Diastolic BP Percentile --      Pulse Rate 12/14/23 1633 71     Resp 12/14/23 1633 16     Temp 12/14/23 1633 98.1 F (36.7 C)     Temp Source 12/14/23 1633 Oral     SpO2 12/14/23 1633 97 %     Weight 12/14/23 1631 130 lb (59 kg)     Height 12/14/23 1631 5' 3 (1.6 m)     Head Circumference --      Peak Flow --      Pain Score 12/14/23 1628 7     Pain Loc --      Pain Education --      Exclude from Growth Chart --     Orthostatic VS for the past 24 hrs:  BP- Lying Pulse- Lying BP- Standing at 0 minutes Pulse- Standing at 0 minutes  12/14/23 1634 108/72 77 120/86 87    Updated Vital Signs Pulse 71   Temp 98.1 F (36.7 C) (Oral)   Resp 16   Ht 5' 3 (1.6 m)   Wt 59 kg   LMP 11/18/2023 (Exact Date)   SpO2 97%   BMI 23.03 kg/m   Visual Acuity Right Eye Distance:   Left Eye Distance:   Bilateral Distance:    Right Eye Near:   Left Eye Near:    Bilateral Near:     Physical Exam Constitutional:      General: She is not in acute distress.    Appearance: She is not toxic-appearing.  HENT:     Head: Normocephalic and atraumatic.     Mouth/Throat:     Mouth: Mucous membranes are moist.     Pharynx: Oropharynx is clear.   Eyes:     General: No scleral icterus.    Extraocular Movements: Extraocular movements intact.     Pupils: Pupils are equal, round, and reactive to light.    Cardiovascular:     Rate and Rhythm: Normal rate and regular rhythm.     Heart sounds: Normal heart sounds. No murmur heard. Pulmonary:     Effort: Pulmonary effort is normal. No respiratory distress.     Breath sounds: Normal breath sounds. No wheezing.  Abdominal:     General: There is no distension.     Palpations: Abdomen is soft.     Tenderness: There is no abdominal tenderness.   Musculoskeletal:        General: No swelling or tenderness.     Cervical back: Normal range of motion and neck supple.  Lymphadenopathy:     Cervical: No cervical adenopathy.   Skin:    General: Skin is warm and dry.     Findings: No rash.   Neurological:     Mental Status: She is alert.     Cranial Nerves: Cranial nerves 2-12 are intact. No cranial nerve deficit or facial  asymmetry.     Sensory: Sensation is intact. No sensory deficit.     Motor: Motor function is intact. No weakness.      UC Treatments / Results  Labs (all labs ordered are listed, but only abnormal results are displayed) Labs Reviewed   BASIC METABOLIC PANEL WITH GFR  CBC    EKG   Radiology No results found.  Procedures Procedures (including critical care time)  Medications Ordered in UC Medications  ibuprofen  (ADVIL ) tablet 800 mg (800 mg Oral Given 12/14/23 1714)    Initial Impression / Assessment and Plan / UC Course  I have reviewed the triage vital signs and the nursing notes.  Pertinent labs & imaging results that were available during my care of the patient were reviewed by me and considered in my medical decision making (see chart for details).     Reported episode of lightheadedness and subsequent headache after prolonged standing at work -- ?vasovagal vs blood pooling vs dehydration Frontal likely tension-type headache was associated with some nausea and blurry vision at first but these other symptoms have resolved Hx iron  deficiency anemia, currently not being treated -- does not follow with PCP  Orthostatic vital signs negative, hemodynamically stable today Recent bloodwork completed 6/18 with slight anemia, otherwise unremarkable  Give ibuprofen  800mg  x1 for headache Check CBC, BMP to evaluate for worsening anemia and/or dehydration or electrolyte abnormalities, staff to f/u results and inform patient if abnormal Recommend starting OTC iron  supplementation   Recommend PCP follow up   Discussed supportive care and return precautions   Final Clinical Impressions(s) / UC Diagnoses   Final diagnoses:  Tension-type headache, not intractable, unspecified chronicity pattern  Light headedness     Discharge Instructions      You can continue using tylenol  or ibuprofen  over the counter for your headache   You can also pick up an iron  supplement over the counter to treat iron  deficiency - I recommend following up with a primary care physician for this  We will check some bloodwork today and will let you know if anything is abnormal  Please try to avoid locking your knees when you stand  especially if standing for a long time Please seek medical attention if your symptoms worsen or you develop severe nausea/vomiting, fevers, changes in your vision, or numbness/weakness     ED Prescriptions   None    PDMP not reviewed this encounter.   Romelle Booty, MD 12/14/23 BERYL Romelle Booty, MD 12/14/23 [id number]

## 2023-12-15 ENCOUNTER — Ambulatory Visit (HOSPITAL_COMMUNITY): Payer: Self-pay

## 2023-12-15 LAB — BASIC METABOLIC PANEL WITH GFR
BUN/Creatinine Ratio: 15 (ref 9–23)
BUN: 13 mg/dL (ref 6–20)
CO2: 22 mmol/L (ref 20–29)
Calcium: 9.2 mg/dL (ref 8.7–10.2)
Chloride: 103 mmol/L (ref 96–106)
Creatinine, Ser: 0.84 mg/dL (ref 0.57–1.00)
Glucose: 83 mg/dL (ref 70–99)
Potassium: 3.8 mmol/L (ref 3.5–5.2)
Sodium: 138 mmol/L (ref 134–144)
eGFR: 99 mL/min/{1.73_m2} (ref >59)

## 2023-12-15 LAB — CBC
Hematocrit: 31.2 % — ABNORMAL LOW (ref 34.0–46.6)
Hemoglobin: 9.6 g/dL — ABNORMAL LOW (ref 11.1–15.9)
MCH: 25.1 pg — ABNORMAL LOW (ref 26.6–33.0)
MCHC: 30.8 g/dL — ABNORMAL LOW (ref 31.5–35.7)
MCV: 82 fL (ref 79–97)
Platelets: 274 10*3/uL (ref 150–450)
RBC: 3.83 x10E6/uL (ref 3.77–5.28)
RDW: 13.9 % (ref 11.7–15.4)
WBC: 4.6 10*3/uL (ref 3.4–10.8)

## 2023-12-27 ENCOUNTER — Ambulatory Visit (INDEPENDENT_AMBULATORY_CARE_PROVIDER_SITE_OTHER): Payer: MEDICAID | Admitting: Obstetrics and Gynecology

## 2023-12-27 ENCOUNTER — Encounter: Payer: Self-pay | Admitting: Obstetrics and Gynecology

## 2023-12-27 ENCOUNTER — Other Ambulatory Visit: Payer: Self-pay

## 2023-12-27 ENCOUNTER — Other Ambulatory Visit (HOSPITAL_COMMUNITY)
Admission: RE | Admit: 2023-12-27 | Discharge: 2023-12-27 | Disposition: A | Discharge status: Home / Self Care | Payer: MEDICAID | Source: Ambulatory Visit | Attending: Obstetrics and Gynecology | Admitting: Obstetrics and Gynecology

## 2023-12-27 VITALS — BP 98/64 | HR 76 | Wt 130.5 lb

## 2023-12-27 DIAGNOSIS — N761 Subacute and chronic vaginitis: Secondary | ICD-10-CM

## 2023-12-27 DIAGNOSIS — Z1331 Encounter for screening for depression: Secondary | ICD-10-CM | POA: Diagnosis not present

## 2023-12-27 DIAGNOSIS — N898 Other specified noninflammatory disorders of vagina: Secondary | ICD-10-CM | POA: Diagnosis not present

## 2023-12-27 MED ORDER — FLUCONAZOLE 150 MG PO TABS: ORAL_TABLET | ORAL | 1 refills | Status: DC

## 2023-12-27 NOTE — Progress Notes (Signed)
   GYNECOLOGY OFFICE VISIT NOTE  History:  Valerie Newman is a 23 y.o. [id number] here today for chronic yeast infection.   Has chronically uncomfortable feeling. Sometimes discharge. She has tried new underwear. Uses unscented wash.   Wonders if it's her partner. However persisted even after stopped having intercourse with partner.   Treatment has largely been diflucan . Responds for a period of time ( 2weeks).   Not on hormonal birth control.    Reviewed swabs - repeated positive for yeast (negative glabrata except back in October 2024 and prior). 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   Health Maintenance:   Diagnosis  Date Value Ref Range Status  05/06/2021   Final   - Negative for intraepithelial lesion or malignancy (NILM)   Review of Systems:  Pertinent items noted in HPI and remainder of comprehensive ROS otherwise negative.  Physical Exam:  BP 98/64   Pulse 76   Wt 130 lb 8 oz (59.2 kg)   LMP 12/15/2023 (Exact Date)   BMI 23.12 kg/m  CONSTITUTIONAL: Well-developed, well-nourished female in no acute distress.  HEENT:  Normocephalic, atraumatic. External right and left ear normal. No scleral icterus.  NECK: Normal range of motion, supple, no masses noted on observation SKIN: No rash noted. Not diaphoretic. No erythema. No pallor. MUSCULOSKELETAL: Normal range of motion. No edema  noted. NEUROLOGIC: Alert and oriented to person, place, and time. Normal muscle tone coordination. No cranial nerve deficit noted. PSYCHIATRIC: Normal mood and affect. Normal behavior. Normal judgment and thought content.  PELVIC: Deferred  Labs and Imaging No results found for this or any previous visit (from the past week). No results found.  Assessment and Plan:  Diagnoses and all orders for this visit:  Chronic vaginitis Discussed measures to help. Will do suppression after treatment.  Discussed cleanser over washes. Discussed boric acid.  Will do suppression for 6 months given persistence of symptoms.  -     Cervicovaginal ancillary only( [place]) -     fluconazole  (DIFLUCAN ) 150 MG tablet; Every 3 days for three days and then weekly thereafter x6 months  Vaginal discharge -     Cervicovaginal ancillary only( [place])     Meds ordered this encounter  Medications   fluconazole  (DIFLUCAN ) 150 MG tablet    Sig: Every 3 days for three days and then weekly thereafter x6 months    Dispense:  15 tablet    Refill:  1     Routine preventative health maintenance measures emphasized. Please refer to After Visit Summary for other counseling recommendations.    Vina Solian, MD, FACOG Obstetrician & Gynecologist, [facility], [facility]

## 2023-12-27 NOTE — Patient Instructions (Addendum)
   Boric acid 600 mg in the vagina twice a week.

## 2023-12-28 ENCOUNTER — Ambulatory Visit: Payer: Self-pay | Admitting: Obstetrics and Gynecology

## 2023-12-28 LAB — CERVICOVAGINAL ANCILLARY ONLY
Bacterial Vaginitis (gardnerella): NEGATIVE
Candida Glabrata: NEGATIVE
Candida Vaginitis: POSITIVE — AB
Chlamydia: NEGATIVE
Comment: NEGATIVE
Comment: NEGATIVE
Comment: NEGATIVE
Comment: NEGATIVE
Comment: NEGATIVE
Comment: NORMAL
Neisseria Gonorrhea: NEGATIVE
Trichomonas: NEGATIVE

## 2024-02-13 ENCOUNTER — Ambulatory Visit
Admission: EM | Admit: 2024-02-13 | Discharge: 2024-02-13 | Disposition: A | Discharge status: Home / Self Care | Payer: MEDICAID | Attending: Family Medicine | Admitting: Family Medicine

## 2024-02-13 DIAGNOSIS — L700 Acne vulgaris: Secondary | ICD-10-CM

## 2024-02-13 MED ORDER — IBUPROFEN 800 MG PO TABS
800.0000 mg | ORAL_TABLET | Freq: Three times a day (TID) | ORAL | 0 refills | Status: AC | PRN
Start: 2024-02-13 — End: ?

## 2024-02-13 MED ORDER — FLUCONAZOLE 150 MG PO TABS: 150.0000 mg | ORAL_TABLET | ORAL | 0 refills | Status: AC

## 2024-02-13 MED ORDER — DOXYCYCLINE HYCLATE 100 MG PO CAPS: 100.0000 mg | ORAL_CAPSULE | Freq: Two times a day (BID) | ORAL | 0 refills | Status: AC

## 2024-02-13 NOTE — Discharge Instructions (Addendum)
 I am managing you for infected cystic acne with doxycycline . Use warm compresses. Use ibuprofen  for pain and inflammation. Fluconazole  can help against yeast infections you would get from antibiotic use.

## 2024-02-13 NOTE — ED Provider Notes (Signed)
 [facility]  Note:  This document was prepared using Conservation officer, historic buildings and may include unintentional dictation errors.  MRN: [id number] DOB: August 29, 2000  Subjective:   Valerie Newman is a 23 y.o. female presenting for 3-day history of painful swollen bumps to the left side of her face with left-sided facial swelling.  Patient has previously had a similar problem before and was seen but cannot remember what she did.  Has been using ibuprofen  with minimal relief.  No current facility-administered medications for this encounter.  Current Outpatient Medications:    fluconazole  (DIFLUCAN ) 150 MG tablet, Every 3 days for three days and then weekly thereafter x6 months, Disp: 15 tablet, Rfl: 1   ibuprofen  (ADVIL ) 600 MG tablet, Take 1 tablet (600 mg total) by mouth every 6 (six) hours as needed (pain)., Disp: 30 tablet, Rfl: 0   nitrofurantoin , macrocrystal-monohydrate, (MACROBID ) 100 MG capsule, Take 1 capsule (100 mg total) by mouth 2 (two) times daily. (Patient not taking: No sig reported), Disp: 10 capsule, Rfl: 0   phenazopyridine  (PYRIDIUM ) 200 MG tablet, Take 1 tablet (200 mg total) by mouth 3 (three) times daily. 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   Allergy to pollen    Anemia    Anxiety    Asthma    Depression    post partum   Headache      Past Surgical History:  Procedure Laterality Date   IUD INSERTION  11/17/2021   IUD REMOVAL  12/27/2021   NO PAST SURGERIES     TOOTH EXTRACTION      Family History  Problem Relation Age of Onset   Heart disease Mother    Stroke Mother    Healthy Father    Diabetes Maternal Aunt    Diabetes Maternal Uncle    Hypertension Maternal  Grandmother    Diabetes Maternal Grandmother    Diabetes Maternal Grandfather     Social History   Tobacco Use   Smoking status: Never    Passive exposure: Never   Smokeless tobacco: Never  Vaping Use   Vaping status: Never Used  Substance Use Topics   Alcohol use: No   Drug use: No    ROS   Objective:   Vitals: BP 98/66 (BP Location: Right Arm)   Pulse 83   Temp 98.2 F (36.8 C) (Oral)   Resp 16   LMP 02/06/2024 (Exact Date)   SpO2 98%   Physical Exam Constitutional:      General: She is not in acute distress.    Appearance: Normal appearance. She is well-developed. She is not ill-appearing, toxic-appearing or diaphoretic.  HENT:     Head: Normocephalic and atraumatic.      Nose: Nose normal.     Mouth/Throat:     Mouth: Mucous membranes are moist.  Eyes:     General: No scleral icterus.       Right eye: No discharge.        Left eye: No discharge.     Extraocular Movements: Extraocular movements intact.  Cardiovascular:     Rate and Rhythm: Normal rate.  Pulmonary:     Effort: Pulmonary effort is normal.  Skin:    General: Skin is warm and dry.  Neurological:     General: No focal deficit present.     Mental Status: She is alert and oriented to person, place, and time.  Psychiatric:        Mood and Affect: Mood normal.        Behavior: Behavior normal.     Assessment and Plan :   PDMP not reviewed this encounter.  1. Cystic acne    Start doxycycline  for suspected cystic acne with secondary cellulitis. Use warm compresses, ibuprofen  for pain and inflammation. Counseled patient on potential for adverse effects with medications prescribed/recommended today, ER and return-to-clinic precautions discussed, patient verbalized understanding.    Christopher Savannah, [place] 02/13/24 1113

## 2024-02-13 NOTE — ED Triage Notes (Signed)
 Pt reports left sided facial swelling and a bump x 3 days.States this happened when she was younger, but can not remember what happened before.  Ibuprofen  gives no relief.

## 2024-02-22 ENCOUNTER — Ambulatory Visit (INDEPENDENT_AMBULATORY_CARE_PROVIDER_SITE_OTHER): Payer: MEDICAID | Admitting: Family

## 2024-02-22 ENCOUNTER — Encounter: Payer: Self-pay | Admitting: Family

## 2024-02-22 VITALS — BP 121/74 | HR 75 | Temp 98.1°F | Resp 16 | Ht 63.0 in | Wt 127.8 lb

## 2024-02-22 DIAGNOSIS — L659 Nonscarring hair loss, unspecified: Secondary | ICD-10-CM | POA: Diagnosis not present

## 2024-02-22 DIAGNOSIS — G44209 Tension-type headache, unspecified, not intractable: Secondary | ICD-10-CM | POA: Diagnosis not present

## 2024-02-22 DIAGNOSIS — B3731 Acute candidiasis of vulva and vagina: Secondary | ICD-10-CM

## 2024-02-22 DIAGNOSIS — Z7689 Persons encountering health services in other specified circumstances: Secondary | ICD-10-CM

## 2024-02-22 DIAGNOSIS — L700 Acne vulgaris: Secondary | ICD-10-CM

## 2024-02-22 NOTE — Progress Notes (Signed)
 Patient stated she gets yeast infection a lot, patient hair has been fallening out since she had her baby

## 2024-02-22 NOTE — Progress Notes (Signed)
 Subjective:    Valerie Newman - 23 y.o. female MRN [id number]  Date of birth: Mar 29, 2001  HPI  Valerie Newman is to establish care.   Current issues and/or concerns: - Patient seen on 02/13/2024 (28 minutes) at [facility]) for cystic acne. Today patient reports feels acne has improved.  - Hair loss. States thinks may be related to she recently had a baby.  - Patient seen on 12/14/2023 (1 hours) at [facility]) for tension-type headache, non intractable unspecified chronicity pattern and additional diagnosis. Today patient reports headaches tolerable with over-the-counter medications. Denies red flag symptoms.  - States frequent yeast infections. States she has tried prescription multiples times and doesn't help, over-the-counter medication with minimal relief, and was seen by Gynecology with no improvement.  - Patient plans to return at later date for labs.   ROS per HPI    Health Maintenance:  There are no preventive care reminders to display for this patient.    Past Medical History: Patient Active Problem List   Diagnosis Date Noted   Infusion reaction 07/15/2021      Social History   reports that she has never smoked. She has never been exposed to tobacco smoke. She has never used smokeless tobacco. She reports that she does not drink alcohol and does not use drugs.   Family History  family history includes Diabetes in her maternal aunt, maternal grandfather, maternal grandmother, and maternal uncle; Healthy in her father; Heart disease in her mother; Hypertension in her maternal grandmother; Stroke in her mother.   Medications: reviewed and updated   Objective:   Physical Exam BP 121/74   Pulse 75   Temp 98.1 F (36.7 C) (Oral)   Resp 16   Ht 5' 3 (1.6 m)   Wt 127 lb 12.8 oz (58 kg)   LMP 02/06/2024 (Exact Date)   SpO2 98%   BMI 22.64 kg/m   Physical Exam HENT:     Head:  Normocephalic and atraumatic.     Comments: Hair loss.     Nose: Nose normal.     Mouth/Throat:     Mouth: Mucous membranes are moist.     Pharynx: Oropharynx is clear.  Eyes:     Extraocular Movements: Extraocular movements intact.     Conjunctiva/sclera: Conjunctivae normal.     Pupils: Pupils are equal, round, and reactive to light.  Cardiovascular:     Rate and Rhythm: Normal rate and regular rhythm.     Pulses: Normal pulses.     Heart sounds: Normal heart sounds.  Pulmonary:     Effort: Pulmonary effort is normal.     Breath sounds: Normal breath sounds.  Musculoskeletal:        General: Normal range of motion.     Cervical back: Normal range of motion and neck supple.  Skin:    General: Skin is warm and dry.     Findings: Acne present.  Neurological:     General: No focal deficit present.     Mental Status: She is alert and oriented to person, place, and time.  Psychiatric:        Mood and Affect: Mood normal.        Behavior: Behavior normal.     Assessment & Plan:  1. Encounter to establish care (Primary) - Patient presents today to establish care. During the interim follow-up with primary provider as scheduled.  - Return for annual physical  examination, labs, and health maintenance. Arrive fasting meaning having no food for at least 8 hours prior to appointment. You may have only water or black coffee. Please take scheduled medications as normal.  2. Cystic acne 3. Hair loss - Referral to Dermatology for evaluation/management. - Ambulatory referral to Dermatology  4. Tension-type headache, not intractable, unspecified chronicity pattern - Patient declined pharmacological therapy.  - Continue over-the-counter regimen.  - Patient declined referral to Neurology.  - Follow-up with primary provider as scheduled.  5. Candida vaginitis - Referral to Gynecology for evaluation/management. - Ambulatory referral to Gynecology    Patient was given clear  instructions to go to Emergency Department or return to medical center if symptoms don't improve, worsen, or new problems develop.The patient verbalized understanding.  I discussed the assessment and treatment plan with the patient. The patient was provided an opportunity to ask questions and all were answered. The patient agreed with the plan and demonstrated an understanding of the instructions.   The patient was advised to call back or seek an in-person evaluation if the symptoms worsen or if the condition fails to improve as anticipated.    Greig Drones, NP 02/22/2024, 11:39 AM Primary Care at [facility]

## 2024-03-13 ENCOUNTER — Ambulatory Visit
Admission: EM | Admit: 2024-03-13 | Discharge: 2024-03-13 | Disposition: A | Discharge status: Home / Self Care | Payer: MEDICAID | Attending: Nurse Practitioner | Admitting: Nurse Practitioner

## 2024-03-13 ENCOUNTER — Ambulatory Visit: Payer: MEDICAID | Admitting: Nurse Practitioner

## 2024-03-13 ENCOUNTER — Encounter: Payer: Self-pay | Admitting: Emergency Medicine

## 2024-03-13 DIAGNOSIS — T148XXA Other injury of unspecified body region, initial encounter: Secondary | ICD-10-CM | POA: Diagnosis present

## 2024-03-13 DIAGNOSIS — R300 Dysuria: Secondary | ICD-10-CM | POA: Diagnosis not present

## 2024-03-13 DIAGNOSIS — N898 Other specified noninflammatory disorders of vagina: Secondary | ICD-10-CM | POA: Diagnosis present

## 2024-03-13 LAB — POCT URINE DIPSTICK
Bilirubin, UA: NEGATIVE
Blood, UA: NEGATIVE
Glucose, UA: NEGATIVE mg/dL
Leukocytes, UA: NEGATIVE
Nitrite, UA: NEGATIVE
Spec Grav, UA: 1.025 (ref 1.010–1.025)
Urobilinogen, UA: 1 U/dL
pH, UA: 7.5 (ref 5.0–8.0)

## 2024-03-13 LAB — POCT URINE PREGNANCY: Preg Test, Ur: NEGATIVE

## 2024-03-13 MED ORDER — TRIAMCINOLONE ACETONIDE 0.025 % EX OINT: 1.0000 | TOPICAL_OINTMENT | Freq: Two times a day (BID) | CUTANEOUS | 0 refills | Status: AC

## 2024-03-13 NOTE — Discharge Instructions (Addendum)
 You were seen today for irritation of the vulvar area that started after sexual intercourse two days ago. This irritation is most likely from friction and does not appear to be caused by an infection. On exam, there were no open sores.  To help with symptoms, apply a small amount of the prescribed ointment two times a day. We also collected a urine sample which did not show any infection. A vaginal swab was also collected and results are pending. You will be contacted if any results require additional treatment.

## 2024-03-13 NOTE — ED Triage Notes (Signed)
 Pt presents c/o skin lesion in vaginal area x 2 days. Pt reports she is also having burning when she urinates. Pt denies any additional sxs.    Pt is requesting STD testing including blood work.

## 2024-03-13 NOTE — ED Provider Notes (Signed)
 [name]    CSN: [id number] Arrival date & time: 03/13/24  1535      History   Chief Complaint Chief Complaint  Patient presents with   Exposure to STD   Vaginal Lesion    HPI Valerie Newman is a 23 y.o. female.   Discussed the use of AI scribe software for clinical note transcription with the patient, who gave verbal consent to proceed.   Valerie Newman presents with genital skin irritation and discomfort following sexual activity 2 days ago. She reports a skin tear with associated bruising in the genital area that occurred after intercourse, describing it as uncomfortable rather than a bump. She experienced pain with urination on the day of the incident but denies ongoing urinary symptoms. The patient reports some vaginal discharge without odor but notes discomfort wearing underwear due to fabric irritation against the affected area. She experienced nausea after eating food on the day of the incident but attributes this to the food rather than ongoing symptoms. She denies abdominal pain, back pain, or current urinary symptoms. Her last menstrual period was on the 19th, she is not on birth control, and reports one sexual partner in the past 3 months. She has had STI testing done in May, July, and August. The patient has a scheduled gynecologic exam next week.  The following sections of the patient's history were reviewed and updated as appropriate: allergies, current medications, past family history, past medical history, past social history, past surgical history, and problem list.       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 Authorizing Provider  triamcinolone  (KENALOG ) 0.025 % ointment Apply 1 Application topically 2 (two) times daily. 03/13/24  Yes Iola Lukes, FNP  doxycycline  (VIBRAMYCIN ) 100 MG capsule Take 1 capsule (100 mg total) by mouth 2 (two) times daily. Patient not taking: Reported on 02/22/2024 02/13/24   Christopher Savannah, PA-C  fluconazole  (DIFLUCAN ) 150 MG tablet Take 1 tablet (150 mg total) by mouth every 3 (three) days. Patient not taking: Reported on 02/22/2024 02/13/24   Christopher Savannah, PA-C  ibuprofen  (ADVIL ) 800 MG tablet Take 1 tablet (800 mg total) by mouth every 8 (eight) hours as needed. 02/13/24   Christopher Savannah, PA-C  nitrofurantoin , macrocrystal-monohydrate, (MACROBID ) 100 MG capsule Take 1 capsule (100 mg total) by mouth 2 (two) times daily. Patient not taking: No sig reported 12/01/23   Iola Lukes, FNP  phenazopyridine  (PYRIDIUM ) 200 MG tablet Take 1 tablet (200 mg total) by mouth 3 (three) times daily. Patient not taking: Reported on 12/27/2023 12/01/23   Iola Lukes, FNP    Family History Family History  Problem Relation Age of Onset   Heart disease Mother    Stroke Mother    Healthy Father    Diabetes Maternal Aunt    Diabetes Maternal Uncle    Hypertension Maternal Grandmother    Diabetes Maternal Grandmother    Diabetes Maternal Grandfather     Social History Social History   Tobacco Use   Smoking status: Never    Passive exposure: Never   Smokeless tobacco: Never  Vaping Use   Vaping status: Never Used  Substance Use Topics   Alcohol use: No   Drug use: No     Allergies   Bee venom, Metronidazole , and Wasp venom   Review of Systems Review  of Systems  Constitutional:  Negative for fever.  Gastrointestinal:  Positive for nausea. Negative for abdominal pain, diarrhea and vomiting.  Genitourinary:  Positive for dysuria and vaginal discharge. Negative for menstrual problem (LMP: 03/03/24).       Some vaginal irritation. No vaginal odor. No itching.   Musculoskeletal:  Negative for back pain.  All other systems reviewed and are negative.    Physical Exam Triage Vital Signs ED Triage Vitals  Encounter Vitals Group     BP 03/13/24 1605 107/64     Girls Systolic BP Percentile --      Girls Diastolic BP Percentile --      Boys Systolic BP Percentile --      Boys Diastolic BP Percentile --      Pulse Rate 03/13/24 1605 86     Resp 03/13/24 1605 16     Temp 03/13/24 1605 98.5 F (36.9 C)     Temp Source 03/13/24 1605 Oral     SpO2 03/13/24 1605 98 %     Weight 03/13/24 1604 127 lb 13.9 oz (58 kg)     Height --      Head Circumference --      Peak Flow --      Pain Score --      Pain Loc --      Pain Education --      Exclude from Growth Chart --    No data found.  Updated Vital Signs BP 107/64 (BP Location: Left Arm)   Pulse 86   Temp 98.5 F (36.9 C) (Oral)   Resp 16   Wt 127 lb 13.9 oz (58 kg)   LMP 03/07/2024 (Exact Date)   SpO2 98%   BMI 22.65 kg/m   Visual Acuity Right Eye Distance:   Left Eye Distance:   Bilateral Distance:    Right Eye Near:   Left Eye Near:    Bilateral Near:     Physical Exam Vitals reviewed. Exam conducted with a chaperone present Rick, RN).  Constitutional:      General: She is awake. She is not in acute distress.    Appearance: Normal appearance. She is well-developed. She is not ill-appearing, toxic-appearing or diaphoretic.  HENT:     Head: Normocephalic.     Right Ear: Hearing normal.     Left Ear: Hearing normal.     Nose: Nose normal.     Mouth/Throat:     Mouth: Mucous membranes are moist.  Eyes:     General: Vision grossly intact.     Conjunctiva/sclera:  Conjunctivae normal.  Cardiovascular:     Rate and Rhythm: Normal rate and regular rhythm.     Heart sounds: Normal heart sounds.  Pulmonary:     Effort: Pulmonary effort is normal.     Breath sounds: Normal breath sounds and air entry.  Abdominal:     Palpations: Abdomen is soft.  Genitourinary:    Labia:        Right: No rash, tenderness, lesion or injury.        Left: No rash, tenderness, lesion or injury.       Comments: Deferred; patient performed self-swab for Aptima testing  Musculoskeletal:        General: Normal range of motion.     Cervical back: Normal range of motion and neck supple.  Skin:    General: Skin is warm and dry.  Neurological:     General: No focal deficit present.     Mental Status: She is alert and oriented to person, place, and time.  Psychiatric:        Mood and Affect: Mood normal.        Speech: Speech normal.        Behavior: Behavior normal. Behavior is cooperative.      UC Treatments / Results  Labs (all labs ordered are listed, but only abnormal results are displayed) Labs Reviewed  POCT URINE DIPSTICK - Abnormal; Notable for the following components:      Result Value   Ketones, POC UA trace (5) (*)    All other components within normal limits  POCT URINE PREGNANCY  CERVICOVAGINAL ANCILLARY ONLY    EKG   Radiology No results found.  Procedures Procedures (including critical care time)  Medications Ordered in UC Medications - No data to display  Initial Impression / Assessment and Plan / UC Course  I have reviewed the triage vital signs and the nursing notes.  Pertinent labs & imaging results that were available during my care of the patient were reviewed by me and considered in my medical decision making (see chart for details).     The patient presents with vulvar irritation that began after sexual intercourse two days ago, associated with discomfort and transient dysuria that has since resolved. She also reports a  small amount of vaginal discharge without odor. On exam, the vulvar area appears irritated but without lesions, ulceration, or signs of infection. Findings are most consistent with friction-induced vulvar irritation rather than sexually transmitted infection or other pathology. A urinalysis and vaginal swab were obtained to rule out urinary tract infection and evaluate vaginal flora. The patient was advised to apply over-the-counter hydrocortisone  cream to the affected external area for symptom relief. She will follow up with her gynecologist at her already scheduled exam next month. She was advised to seek earlier evaluation if symptoms worsen, new lesions develop, or systemic symptoms such as fever occur, and to present to the emergency department if she develops severe pain, spreading redness, or inability to urinate.  Today's evaluation has revealed no signs of a dangerous process. Discussed diagnosis with patient and/or guardian. Patient and/or guardian aware of their diagnosis, possible red flag symptoms to watch out for and need for close follow up. Patient and/or guardian understands verbal and written discharge instructions. Patient and/or guardian comfortable with plan and disposition.  Patient and/or guardian has a clear mental status at this time, good insight into illness (after discussion and teaching) and has clear judgment to make decisions regarding their care  Documentation was  completed with the aid of voice recognition software. Transcription may contain typographical errors.  Final Clinical Impressions(s) / UC Diagnoses   Final diagnoses:  Dysuria  Friction injury to skin  Vaginal discharge     Discharge Instructions      You were seen today for irritation of the vulvar area that started after sexual intercourse two days ago. This irritation is most likely from friction and does not appear to be caused by an infection. On exam, there were no open sores.  To help with symptoms,  apply a small amount of the prescribed ointment two times a day. We also collected a urine sample and a vaginal swab to check for urinary tract infection or vaginal infection. You will be contacted if any results require additional treatment.      ED Prescriptions     Medication Sig Dispense Auth. Provider   triamcinolone  (KENALOG ) 0.025 % ointment Apply 1 Application topically 2 (two) times daily. 15 g Iola Lukes, FNP      PDMP not reviewed this encounter.   Iola Lukes, [place] 03/13/24 [phone]

## 2024-03-14 LAB — CERVICOVAGINAL ANCILLARY ONLY
Bacterial Vaginitis (gardnerella): NEGATIVE
Candida Glabrata: NEGATIVE
Candida Vaginitis: POSITIVE — AB
Chlamydia: NEGATIVE
Comment: NEGATIVE
Comment: NEGATIVE
Comment: NEGATIVE
Comment: NEGATIVE
Comment: NEGATIVE
Comment: NORMAL
Neisseria Gonorrhea: NEGATIVE
Trichomonas: NEGATIVE

## 2024-03-15 ENCOUNTER — Ambulatory Visit (HOSPITAL_COMMUNITY): Payer: Self-pay

## 2024-03-15 MED ORDER — FLUCONAZOLE 150 MG PO TABS: 150.0000 mg | ORAL_TABLET | Freq: Once | ORAL | 0 refills | Status: AC

## 2024-04-12 ENCOUNTER — Ambulatory Visit: Payer: MEDICAID | Admitting: Family

## 2024-04-20 ENCOUNTER — Encounter: Payer: Self-pay | Admitting: Obstetrics and Gynecology

## 2024-04-20 ENCOUNTER — Other Ambulatory Visit (HOSPITAL_COMMUNITY)
Admission: RE | Admit: 2024-04-20 | Discharge: 2024-04-20 | Disposition: A | Payer: MEDICAID | Source: Ambulatory Visit | Attending: Obstetrics and Gynecology | Admitting: Obstetrics and Gynecology

## 2024-04-20 ENCOUNTER — Ambulatory Visit: Payer: MEDICAID | Admitting: Obstetrics and Gynecology

## 2024-04-20 VITALS — BP 124/80 | HR 80 | Wt 128.7 lb

## 2024-04-20 DIAGNOSIS — Z202 Contact with and (suspected) exposure to infections with a predominantly sexual mode of transmission: Secondary | ICD-10-CM | POA: Diagnosis not present

## 2024-04-20 DIAGNOSIS — Z124 Encounter for screening for malignant neoplasm of cervix: Secondary | ICD-10-CM | POA: Insufficient documentation

## 2024-04-20 DIAGNOSIS — Z8742 Personal history of other diseases of the female genital tract: Secondary | ICD-10-CM

## 2024-04-20 NOTE — Patient Instructions (Signed)
 Consume a quarter to half cup of fermented foods, like Kefir, every night

## 2024-04-20 NOTE — Progress Notes (Signed)
 Obstetrics and Gynecology New Patient Evaluation  Appointment Date: 04/20/2024  OBGYN Clinic: [facility]   Primary Care Provider: Jaycee Greig Newman   Chief Complaint:  Chief Complaint  Patient presents with   routine pap smear    History of Present Illness: Valerie Newman is a 23 y.o.  [id number] seen for the above chief complaint.   No issues today. She does have a history of recurrent yeast infections, with no current s/s, but is wondering what she can do about this. She states that the pills and topical treatments don't work. She doesn't use any birth control, including condoms, and no lubrication.  Review of Systems: Pertinent items are noted in HPI.   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   Allergy to pollen    Anemia    Anxiety    Asthma    Depression    post partum   Headache    Past Surgical History:  Past Surgical History:  Procedure Laterality Date   IUD INSERTION  11/17/2021   IUD REMOVAL  12/27/2021   NO PAST SURGERIES     TOOTH EXTRACTION     Past Obstetrical History:  OB History  Gravida Para Term Preterm AB Living  3 2 2  0 1 2  SAB IAB Ectopic Multiple Live Births  0 1 0 0 2    # Outcome Date GA Lbr Len/2nd Weight Sex Type Anes PTL Lv  3 Term 09/25/21 [redacted]w[redacted]d 00:50 / 00:26 6 lb 10.9 oz (3.03 kg) F Vag-Spont EPI  LIV  2 Term 11/03/17 [redacted]w[redacted]d 03:45 / 00:05 6 lb 15.6 oz (3.165 kg) F Vag-Spont EPI  LIV  1 IAB             Past Gynecological History: As per HPI.  Social History:  Social History   Socioeconomic History   Marital status: Single    Spouse name: Not on file   Number of children: 1   Years of education: Not on file   Highest education level: High school graduate  Occupational History   Not on file  Tobacco Use   Smoking status: Never    Passive exposure: Never   Smokeless tobacco: Never  Vaping Use   Vaping status: Never Used  Substance  and Sexual Activity   Alcohol use: No   Drug use: No   Sexual activity: Yes    Birth control/protection: None  Other Topics Concern   Not on file  Social History Narrative   Not on file   Social Drivers of Health   Financial Resource Strain: Low Risk  (02/22/2024)   Overall Financial Resource Strain (CARDIA)    Difficulty of Paying Living Expenses: Not hard at all  Food Insecurity: Food Insecurity Present (04/20/2024)   Hunger Vital Sign    Worried About Running Out of Food in the Last Year: Sometimes true    Ran Out of Food in the Last Year: Not on file  Transportation Needs: No Transportation Needs (04/20/2024)   PRAPARE - Administrator, Civil Service (Medical): No    Lack of Transportation (Non-Medical): No  Physical Activity: Sufficiently Active (02/22/2024)   Exercise Vital Sign    Days of Exercise per Week: 5 days    Minutes of Exercise per Session: 30 min  Stress: No Stress Concern Present (02/22/2024)   [employer] of Occupational Health - Occupational Stress Questionnaire  Feeling of Stress: Not at all  Social Connections: Moderately Integrated (02/22/2024)   Social Connection and Isolation Panel    Frequency of Communication with Friends and Family: More than three times a week    Frequency of Social Gatherings with Friends and Family: More than three times a week    Attends Religious Services: More than 4 times per year    Active Member of [employer] or Organizations: No    Attends Banker Meetings: Never    Marital Status: Living with partner  Recent Concern: Social Connections - Moderately Isolated (02/22/2024)   Social Connection and Isolation Panel    Frequency of Communication with Friends and Family: More than three times a week    Frequency of Social Gatherings with Friends and Family: Never    Attends Religious Services: More than 4 times per year    Active Member of [employer] or Organizations: No    Attends Banker Meetings:  Never    Marital Status: Never married  Intimate Partner Violence: Not At Risk (02/22/2024)   Humiliation, Afraid, Rape, and Kick questionnaire    Fear of Current or Ex-Partner: No    Emotionally Abused: No    Physically Abused: No    Sexually Abused: No   Family History:  Family History  Problem Relation Age of Onset   Heart disease Mother    Stroke Mother    Healthy Father    Diabetes Maternal Aunt    Diabetes Maternal Uncle    Hypertension Maternal Grandmother    Diabetes Maternal Grandmother    Diabetes Maternal Grandfather     Medications Valerie Newman had no medications administered during this visit. Current Outpatient Medications  Medication Sig Dispense Refill   doxycycline  (VIBRAMYCIN ) 100 MG capsule Take 1 capsule (100 mg total) by mouth 2 (two) times daily. (Patient not taking: Reported on 04/20/2024) 20 capsule 0   fluconazole  (DIFLUCAN ) 150 MG tablet Take 1 tablet (150 mg total) by mouth every 3 (three) days. (Patient not taking: Reported on 04/20/2024) 3 tablet 0   ibuprofen  (ADVIL ) 800 MG tablet Take 1 tablet (800 mg total) by mouth every 8 (eight) hours as needed. (Patient not taking: Reported on 04/20/2024) 30 tablet 0   nitrofurantoin , macrocrystal-monohydrate, (MACROBID ) 100 MG capsule Take 1 capsule (100 mg total) by mouth 2 (two) times daily. (Patient not taking: Reported on 04/20/2024) 10 capsule 0   phenazopyridine  (PYRIDIUM ) 200 MG tablet Take 1 tablet (200 mg total) by mouth 3 (three) times daily. (Patient not taking: Reported on 04/20/2024) 6 tablet 0   triamcinolone  (KENALOG ) 0.025 % ointment Apply 1 Application topically 2 (two) times daily. (Patient not taking: Reported on 04/20/2024) 15 g 0   No current facility-administered medications for this visit.   Allergies Bee venom, Metronidazole , and Wasp venom  Physical Exam:  BP 124/80   Pulse 80   Wt 128 lb 11.2 oz (58.4 kg)   BMI 22.80 kg/m  Body mass index is 22.8 kg/m. General appearance: Well  nourished, well developed female in no acute distress.  Respiratory:  Normal respiratory effort Abdomen soft, nttp, nd Neuro/Psych:  Normal mood and affect.  Skin:  Warm and dry.  Lymphatic:  No inguinal lymphadenopathy.   Cervical exam performed in the presence of a chaperone Pelvic exam: is not limited by body habitus EGBUS: within normal limits Vagina: within normal limits and with no blood or discharge in the vault Cervix: normal appearing cervix without tenderness, discharge or lesions.  Uterus:  nonenlarged and non tender Adnexa:  normal adnexa and no mass, fullness, tenderness Rectovaginal: deferred  Laboratory: no new labs 03/13/24 swab positive for candida vaginitis only  Radiology: none  Assessment: patient stable  Plan:  1. Encounter for Papanicolaou smear for cervical cancer screening (Primary) Only wants swab STD testing - Cytology - PAP( [place])  2. History of vaginitis D/w her re: lifestyle and behavioral changes, especially fermented foods. I saw said that the next time she has s/s to let us  know for a provider visit for a good swab sample and recommend yeast culture and can do MIC testing too. The last time I did this on a patient, here is what the [facility] tech said: All you need is the pink swab and test [phone] and when the yeast has been identified we will call and add on the susceptibility testing for Fluconazole . Per Erminio in [employer].  - Hemoglobin A1c  Orders Placed This Encounter  Procedures   Hemoglobin A1c    Return if symptoms worsen or fail to improve.  Future Appointments  Date Time Provider Department Center  04/21/2024  8:00 AM Valerie Greig PARAS, NP [facility]    Bebe Izell Raddle MD Attending [facility])

## 2024-04-21 ENCOUNTER — Encounter: Payer: MEDICAID | Admitting: Family

## 2024-04-21 ENCOUNTER — Ambulatory Visit: Payer: Self-pay | Admitting: Obstetrics and Gynecology

## 2024-04-21 ENCOUNTER — Telehealth: Payer: Self-pay | Admitting: Family

## 2024-04-21 LAB — HEMOGLOBIN A1C
Est. average glucose Bld gHb Est-mCnc: 100 mg/dL
Hgb A1c MFr Bld: 5.1 % (ref 4.8–5.6)

## 2024-04-21 NOTE — Progress Notes (Signed)
 Erroneous encounter-disregard

## 2024-04-21 NOTE — Telephone Encounter (Signed)
 I return patient call to give her referral information no one answered so I left a voicemail to return my call.  [facility] [address] [place], [place] [address] [phone]

## 2024-04-21 NOTE — Telephone Encounter (Signed)
 Pt requesting referral for cystic acne/hair loss per pt last OV

## 2024-04-25 NOTE — Telephone Encounter (Signed)
 I called patient and made her aware that I was sending the information through my chart

## 2024-04-26 LAB — CYTOLOGY - PAP
Chlamydia: NEGATIVE
Comment: NEGATIVE
Comment: NEGATIVE
Comment: NORMAL
Diagnosis: NEGATIVE
Neisseria Gonorrhea: NEGATIVE
Trichomonas: NEGATIVE

## 2024-05-16 ENCOUNTER — Emergency Department (HOSPITAL_COMMUNITY)
Admission: EM | Admit: 2024-05-16 | Discharge: 2024-05-16 | Disposition: A | Discharge status: Home / Self Care | Payer: MEDICAID

## 2024-05-16 ENCOUNTER — Encounter (HOSPITAL_COMMUNITY): Payer: Self-pay | Admitting: Emergency Medicine

## 2024-05-16 DIAGNOSIS — R531 Weakness: Secondary | ICD-10-CM

## 2024-05-16 DIAGNOSIS — R110 Nausea: Secondary | ICD-10-CM | POA: Diagnosis not present

## 2024-05-16 LAB — COMPREHENSIVE METABOLIC PANEL WITH GFR
ALT: 9 U/L (ref 0–44)
AST: 24 U/L (ref 15–41)
Albumin: 4.4 g/dL (ref 3.5–5.0)
Alkaline Phosphatase: 47 U/L (ref 38–126)
Anion gap: 9 (ref 5–15)
BUN: 11 mg/dL (ref 6–20)
CO2: 23 mmol/L (ref 22–32)
Calcium: 9.4 mg/dL (ref 8.9–10.3)
Chloride: 104 mmol/L (ref 98–111)
Creatinine, Ser: 0.67 mg/dL (ref 0.44–1.00)
GFR, Estimated: >60 mL/min (ref >60)
Glucose, Bld: 90 mg/dL (ref 70–99)
Potassium: 3.8 mmol/L (ref 3.5–5.1)
Sodium: 137 mmol/L (ref 135–145)
Total Bilirubin: 0.6 mg/dL (ref 0.0–1.2)
Total Protein: 7.4 g/dL (ref 6.5–8.1)

## 2024-05-16 LAB — CBC
HCT: 31.6 % — ABNORMAL LOW (ref 36.0–46.0)
Hemoglobin: 9.7 g/dL — ABNORMAL LOW (ref 12.0–15.0)
MCH: 24.6 pg — ABNORMAL LOW (ref 26.0–34.0)
MCHC: 30.7 g/dL (ref 30.0–36.0)
MCV: 80 fL (ref 80.0–100.0)
Platelets: 221 K/uL (ref 150–400)
RBC: 3.95 MIL/uL (ref 3.87–5.11)
RDW: 14.6 % (ref 11.5–15.5)
WBC: 6.6 K/uL (ref 4.0–10.5)
nRBC: 0 % (ref 0.0–0.2)

## 2024-05-16 LAB — IRON AND TIBC
Iron: 47 ug/dL (ref 28–170)
Saturation Ratios: 10 % — ABNORMAL LOW (ref 10.4–31.8)
TIBC: 483 ug/dL — ABNORMAL HIGH (ref 250–450)
UIBC: 436 ug/dL

## 2024-05-16 LAB — FERRITIN: Ferritin: 8 ng/mL — ABNORMAL LOW (ref 11–307)

## 2024-05-16 LAB — HCG, SERUM, QUALITATIVE: Preg, Serum: NEGATIVE

## 2024-05-16 LAB — CBG MONITORING, ED: Glucose-Capillary: 80 mg/dL (ref 70–99)

## 2024-05-16 MED ORDER — LACTATED RINGERS IV BOLUS
1000.0000 mL | Freq: Once | INTRAVENOUS | Status: AC
  Administered 2024-05-16: 1000 mL via INTRAVENOUS

## 2024-05-16 MED ORDER — ONDANSETRON HCL 4 MG PO TABS: 4.0000 mg | ORAL_TABLET | Freq: Three times a day (TID) | ORAL | 0 refills | Status: AC | PRN

## 2024-05-16 NOTE — Discharge Instructions (Signed)
 Drink lots of fluids and eat a balanced diet.  You can use Zofran  as needed for nausea and vomiting.  Call your primary care doctor make a follow-up appointment in 1 to 2 weeks.  Return to the ER for new or worsening symptoms.

## 2024-05-16 NOTE — ED Triage Notes (Signed)
 Pt arriving POV with weakness that began this morning. Pt also reports nausea without vomiting. Pt states she is concerned that her iron  levels may be low because this has happened before.

## 2024-05-16 NOTE — ED Provider Notes (Signed)
 [place] EMERGENCY DEPARTMENT AT [facility] Provider Note   CSN: [id number] Arrival date & time: 05/16/24  1025     Patient presents with: Weakness   Valerie Newman is a 23 y.o. female.   23 year old female presents for evaluation of weakness.  She states this started today.  She states that she was walking in the store and kept tripping and was driving and felt very weak and tired.  States she is felt like this when her iron  levels are low before.  She is requesting that they be checked today.  Admits to some nausea but no other symptoms or concerns at this time.   Weakness Associated symptoms: no abdominal pain, no arthralgias, no chest pain, no cough, no dysuria, no fever, no seizures, no shortness of breath and no vomiting        Prior to Admission medications   Medication Sig Start Date End Date Taking? Authorizing Provider  ondansetron  (ZOFRAN ) 4 MG tablet Take 1 tablet (4 mg total) by mouth every 8 (eight) hours as needed for up to 4 days. 05/16/24 05/20/24 Yes Izabel Chim L, DO  doxycycline  (VIBRAMYCIN ) 100 MG capsule Take 1 capsule (100 mg total) by mouth 2 (two) times daily. Patient not taking: Reported on 04/20/2024 02/13/24   Christopher Savannah, PA-C  fluconazole  (DIFLUCAN ) 150 MG tablet Take 1 tablet (150 mg total) by mouth every 3 (three) days. Patient not taking: Reported on 04/20/2024 02/13/24   Christopher Savannah, PA-C  ibuprofen  (ADVIL ) 800 MG tablet Take 1 tablet (800 mg total) by mouth every 8 (eight) hours as needed. Patient not taking: Reported on 04/20/2024 02/13/24   Christopher Savannah, PA-C  nitrofurantoin , macrocrystal-monohydrate, (MACROBID ) 100 MG capsule Take 1 capsule (100 mg total) by mouth 2 (two) times daily. Patient not taking: Reported on 04/20/2024 12/01/23   Murrill, Samantha, FNP  phenazopyridine  (PYRIDIUM ) 200 MG tablet Take 1 tablet (200 mg total) by mouth 3 (three) times daily. Patient not taking: Reported on 04/20/2024 12/01/23   Iola Lukes, FNP   triamcinolone  (KENALOG ) 0.025 % ointment Apply 1 Application topically 2 (two) times daily. Patient not taking: Reported on 04/20/2024 03/13/24   Iola Lukes, FNP    Allergies: Bee venom, Metronidazole , and Wasp venom    Review of Systems  Constitutional:  Negative for chills and fever.  HENT:  Negative for ear pain and sore throat.   Eyes:  Negative for pain and visual disturbance.  Respiratory:  Negative for cough and shortness of breath.   Cardiovascular:  Negative for chest pain and palpitations.  Gastrointestinal:  Negative for abdominal pain and vomiting.  Genitourinary:  Negative for dysuria and hematuria.  Musculoskeletal:  Negative for arthralgias and back pain.  Skin:  Negative for color change and rash.  Neurological:  Positive for weakness. Negative for seizures and syncope.  All other systems reviewed and are negative.   Updated Vital Signs BP 121/72 (BP Location: Right Arm)   Pulse 83   Temp 99 F (37.2 C) (Oral)   Resp 16   LMP 04/18/2024 (Approximate)   SpO2 100%   Physical Exam Vitals and nursing note reviewed.  Constitutional:      General: She is not in acute distress.    Appearance: Normal appearance. She is well-developed. She is not ill-appearing.  HENT:     Head: Normocephalic and atraumatic.  Eyes:     Conjunctiva/sclera: Conjunctivae normal.  Cardiovascular:     Rate and Rhythm: Normal rate and regular rhythm.  Heart sounds: No murmur heard. Pulmonary:     Effort: Pulmonary effort is normal. No respiratory distress.     Breath sounds: Normal breath sounds.  Abdominal:     Palpations: Abdomen is soft.     Tenderness: There is no abdominal tenderness.  Musculoskeletal:        General: No swelling.     Cervical back: Neck supple.  Skin:    General: Skin is warm and dry.     Capillary Refill: Capillary refill takes less than 2 seconds.  Neurological:     Mental Status: She is alert.  Psychiatric:        Mood and Affect: Mood  normal.     (all labs ordered are listed, but only abnormal results are displayed) Labs Reviewed  CBC - Abnormal; Notable for the following components:      Result Value   Hemoglobin 9.7 (*)    HCT 31.6 (*)    MCH 24.6 (*)    All other components within normal limits  IRON  AND TIBC - Abnormal; Notable for the following components:   TIBC 483 (*)    Saturation Ratios 10 (*)    All other components within normal limits  FERRITIN - Abnormal; Notable for the following components:   Ferritin 8 (*)    All other components within normal limits  COMPREHENSIVE METABOLIC PANEL WITH GFR  HCG, SERUM, QUALITATIVE  URINALYSIS, ROUTINE W REFLEX MICROSCOPIC  CBG MONITORING, ED    EKG: None  Radiology: No results found.   Procedures   Medications Ordered in the ED  lactated ringers  bolus 1,000 mL (0 mLs Intravenous Stopped 05/16/24 1429)                                    Medical Decision Making Patient here for feeling generally weak and is concerned that is her iron  levels.  Hemoglobin and iron  levels are within normal limits.  She is given a liter of IV fluids, able to walk around and feeling much improved.  Advise close up with primary care doctor, to increase her fluid intake and eat a balanced diet.  Advised to return to the ER for new or worsening symptoms.  She feels comfortable being discharged home.  Problems Addressed: Nausea: acute illness or injury Weakness: acute illness or injury  Amount and/or Complexity of Data Reviewed External Data Reviewed: notes. Labs: ordered. Decision-making details documented in ED Course.    Details: Ordered and reviewed by me and unremarkable  Risk OTC drugs. Prescription drug management. Drug therapy requiring intensive monitoring for toxicity.     Final diagnoses:  Weakness  Nausea    ED Discharge Orders          Ordered    ondansetron  (ZOFRAN ) 4 MG tablet  Every 8 hours PRN        05/16/24 1416                Maleak Brazzel L, DO 05/16/24 1703

## 2024-06-28 ENCOUNTER — Ambulatory Visit: Payer: MEDICAID | Admitting: Family

## 2024-06-28 VITALS — BP 117/78 | HR 82 | Temp 98.0°F | Resp 16 | Wt 132.4 lb

## 2024-06-28 DIAGNOSIS — R519 Headache, unspecified: Secondary | ICD-10-CM

## 2024-06-28 DIAGNOSIS — L659 Nonscarring hair loss, unspecified: Secondary | ICD-10-CM | POA: Diagnosis not present

## 2024-06-28 MED ORDER — TOPIRAMATE 25 MG PO TABS: 25.0000 mg | ORAL_TABLET | Freq: Every day | ORAL | 0 refills | Status: AC

## 2024-06-28 MED ORDER — SUMATRIPTAN SUCCINATE 50 MG PO TABS: ORAL_TABLET | ORAL | 1 refills | Status: AC

## 2024-06-28 NOTE — Progress Notes (Signed)
 "   Patient ID: Valerie Newman, female    DOB: 2001/04/19  MRN: [id number]  CC: Headaches  Subjective: Valerie Newman is a 24 y.o. female who presents for headaches.   Her concerns today include:  - Headaches persisting. Denies red flag symptoms. Taking over-the-counter medication with some relief. Would like to try medication and referral to specialist to see if this helps.  - Hair loss persisting. States she cannot put her hair in a ponytail and she wears a wig to cover her hair. States since previous office visit she did not establish with Dermatology.   Patient Active Problem List   Diagnosis Date Noted   Infusion reaction 07/15/2021     Medications Ordered Prior to Encounter[1]  Allergies[2]  Social History   Socioeconomic History   Marital status: Single    Spouse name: Not on file   Number of children: 1   Years of education: Not on file   Highest education level: High school graduate  Occupational History   Not on file  Tobacco Use   Smoking status: Never    Passive exposure: Never   Smokeless tobacco: Never  Vaping Use   Vaping status: Never Used  Substance and Sexual Activity   Alcohol use: No   Drug use: No   Sexual activity: Yes    Birth control/protection: None  Other Topics Concern   Not on file  Social History Narrative   Not on file   Social Drivers of Health   Tobacco Use: Low Risk (05/16/2024)   Patient History    Smoking Tobacco Use: Never    Smokeless Tobacco Use: Never    Passive Exposure: Never  Financial Resource Strain: Low Risk (02/22/2024)   Overall Financial Resource Strain (CARDIA)    Difficulty of Paying Living Expenses: Not hard at all  Food Insecurity: Food Insecurity Present (04/20/2024)   Epic    Worried About Programme Researcher, Broadcasting/film/video in the Last Year: Sometimes true    [employer] of Food in the Last Year: Not on file  Transportation Needs: No Transportation Needs (04/20/2024)   Epic    Lack of Transportation (Medical): No    Lack  of Transportation (Non-Medical): No  Physical Activity: Sufficiently Active (02/22/2024)   Exercise Vital Sign    Days of Exercise per Week: 5 days    Minutes of Exercise per Session: 30 min  Stress: No Stress Concern Present (02/22/2024)   [employer] of Occupational Health - Occupational Stress Questionnaire    Feeling of Stress: Not at all  Social Connections: Moderately Integrated (02/22/2024)   Social Connection and Isolation Panel    Frequency of Communication with Friends and Family: More than three times a week    Frequency of Social Gatherings with Friends and Family: More than three times a week    Attends Religious Services: More than 4 times per year    Active Member of [employer] or Organizations: No    Attends Banker Meetings: Never    Marital Status: Living with partner  Recent Concern: Social Connections - Moderately Isolated (02/22/2024)   Social Connection and Isolation Panel    Frequency of Communication with Friends and Family: More than three times a week    Frequency of Social Gatherings with Friends and Family: Never    Attends Religious Services: More than 4 times per year    Active Member of [employer] or Organizations: No    Attends Banker Meetings: Never    Marital  Status: Never married  Intimate Partner Violence: Not At Risk (02/22/2024)   Epic    Fear of Current or Ex-Partner: No    Emotionally Abused: No    Physically Abused: No    Sexually Abused: No  Depression (PHQ2-9): Low Risk (04/20/2024)   Depression (PHQ2-9)    PHQ-2 Score: 1  Alcohol Screen: Low Risk (02/22/2024)   Alcohol Screen    Last Alcohol Screening Score (AUDIT): 0  Housing: Unknown (02/22/2024)   Epic    Unable to Pay for Housing in the Last Year: No    Number of Times Moved in the Last Year: Not on file    Homeless in the Last Year: No  Utilities: Not At Risk (02/22/2024)   Epic    Threatened with loss of utilities: No  Health Literacy: Adequate Health Literacy  (02/22/2024)   B1300 Health Literacy    Frequency of need for help with medical instructions: Never    Family History  Problem Relation Age of Onset   Heart disease Mother    Stroke Mother    Healthy Father    Diabetes Maternal Aunt    Diabetes Maternal Uncle    Hypertension Maternal Grandmother    Diabetes Maternal Grandmother    Diabetes Maternal Grandfather     Past Surgical History:  Procedure Laterality Date   IUD INSERTION  11/17/2021   IUD REMOVAL  12/27/2021   NO PAST SURGERIES     TOOTH EXTRACTION      ROS: Review of Systems Negative except as stated above  PHYSICAL EXAM: BP 117/78   Pulse 82   Temp 98 F (36.7 C) (Oral)   Resp 16   Wt 132 lb 6.4 oz (60.1 kg)   SpO2 98%   BMI 23.45 kg/m   Physical Exam HENT:     Head: Normocephalic and atraumatic.     Right Ear: Tympanic membrane, ear canal and external ear normal.     Left Ear: Tympanic membrane, ear canal and external ear normal.     Nose: Nose normal.     Mouth/Throat:     Mouth: Mucous membranes are moist.     Pharynx: Oropharynx is clear.  Eyes:     Extraocular Movements: Extraocular movements intact.     Conjunctiva/sclera: Conjunctivae normal.     Pupils: Pupils are equal, round, and reactive to light.  Cardiovascular:     Rate and Rhythm: Normal rate and regular rhythm.     Pulses: Normal pulses.     Heart sounds: Normal heart sounds.  Pulmonary:     Effort: Pulmonary effort is normal.     Breath sounds: Normal breath sounds.  Musculoskeletal:        General: Normal range of motion.     Cervical back: Normal range of motion and neck supple.  Neurological:     General: No focal deficit present.     Mental Status: She is alert and oriented to person, place, and time.  Psychiatric:        Mood and Affect: Mood normal.        Behavior: Behavior normal.     ASSESSMENT AND PLAN: 1. Nonintractable headache, unspecified chronicity pattern, unspecified headache type (Primary) - Sumatriptan   and Topiramate  as prescribed. Counseled on medication adherence/adverse effects.  - Referral to Neurology for evaluation/management.  - Follow-up with primary provider in 4 weeks or sooner if needed.  - Ambulatory referral to Neurology - SUMAtriptan  (IMITREX ) 50 MG tablet; Take 25 mg (1 tablet  total) by mouth at the start of the headache. May repeat in 2 hours x 1 if headache persists. Max of 2 tablets/24 hours.  Dispense: 90 tablet; Refill: 1 - topiramate  (TOPAMAX ) 25 MG tablet; Take 1 tablet (25 mg total) by mouth daily.  Dispense: 90 tablet; Refill: 0  2. Hair loss - Referral to Dermatology for evaluation/management.  - Ambulatory referral to Dermatology  Patient was given the opportunity to ask questions.  Patient verbalized understanding of the plan and was able to repeat key elements of the plan. Patient was given clear instructions to go to Emergency Department or return to medical center if symptoms don't improve, worsen, or new problems develop.The patient verbalized understanding.   Orders Placed This Encounter  Procedures   Ambulatory referral to Dermatology   Ambulatory referral to Neurology     Requested Prescriptions   Signed Prescriptions Disp Refills   SUMAtriptan  (IMITREX ) 50 MG tablet 90 tablet 1    Sig: Take 25 mg (1 tablet total) by mouth at the start of the headache. May repeat in 2 hours x 1 if headache persists. Max of 2 tablets/24 hours.   topiramate  (TOPAMAX ) 25 MG tablet 90 tablet 0    Sig: Take 1 tablet (25 mg total) by mouth daily.    Return for Follow-up as needed.  Greig JINNY Chute, NP      [1]  Current Outpatient Medications on File Prior to Visit  Medication Sig Dispense Refill   doxycycline  (VIBRAMYCIN ) 100 MG capsule Take 1 capsule (100 mg total) by mouth 2 (two) times daily. (Patient not taking: Reported on 04/20/2024) 20 capsule 0   fluconazole  (DIFLUCAN ) 150 MG tablet Take 1 tablet (150 mg total) by mouth every 3 (three) days. (Patient not  taking: Reported on 04/20/2024) 3 tablet 0   ibuprofen  (ADVIL ) 800 MG tablet Take 1 tablet (800 mg total) by mouth every 8 (eight) hours as needed. (Patient not taking: Reported on 04/20/2024) 30 tablet 0   nitrofurantoin , macrocrystal-monohydrate, (MACROBID ) 100 MG capsule Take 1 capsule (100 mg total) by mouth 2 (two) times daily. (Patient not taking: Reported on 04/20/2024) 10 capsule 0   phenazopyridine  (PYRIDIUM ) 200 MG tablet Take 1 tablet (200 mg total) by mouth 3 (three) times daily. (Patient not taking: Reported on 04/20/2024) 6 tablet 0   triamcinolone  (KENALOG ) 0.025 % ointment Apply 1 Application topically 2 (two) times daily. (Patient not taking: Reported on 04/20/2024) 15 g 0   No current facility-administered medications on file prior to visit.  [2]  Allergies Allergen Reactions   Bee Venom Swelling   Metronidazole  Nausea And Vomiting    Other Reaction(s): Abdominal Pain   Wasp Venom Swelling   "

## 2024-07-16 ENCOUNTER — Encounter (HOSPITAL_COMMUNITY): Payer: Self-pay

## 2024-07-16 ENCOUNTER — Other Ambulatory Visit: Payer: Self-pay

## 2024-07-16 ENCOUNTER — Emergency Department (HOSPITAL_COMMUNITY)
Admission: EM | Admit: 2024-07-16 | Discharge: 2024-07-16 | Disposition: A | Discharge status: Home / Self Care | Payer: MEDICAID | Attending: Emergency Medicine | Admitting: Emergency Medicine

## 2024-07-16 DIAGNOSIS — R55 Syncope and collapse: Secondary | ICD-10-CM | POA: Insufficient documentation

## 2024-07-16 LAB — COMPREHENSIVE METABOLIC PANEL WITH GFR
ALT: 9 U/L (ref 0–44)
AST: 24 U/L (ref 15–41)
Albumin: 4.5 g/dL (ref 3.5–5.0)
Alkaline Phosphatase: 53 U/L (ref 38–126)
Anion gap: 10 (ref 5–15)
BUN: 9 mg/dL (ref 6–20)
CO2: 25 mmol/L (ref 22–32)
Calcium: 9.6 mg/dL (ref 8.9–10.3)
Chloride: 104 mmol/L (ref 98–111)
Creatinine, Ser: 0.81 mg/dL (ref 0.44–1.00)
GFR, Estimated: >60 mL/min
Glucose, Bld: 94 mg/dL (ref 70–99)
Potassium: 3.4 mmol/L — ABNORMAL LOW (ref 3.5–5.1)
Sodium: 139 mmol/L (ref 135–145)
Total Bilirubin: 0.6 mg/dL (ref 0.0–1.2)
Total Protein: 8.1 g/dL (ref 6.5–8.1)

## 2024-07-16 LAB — CBC WITH DIFFERENTIAL/PLATELET
Abs Immature Granulocytes: 0.02 10*3/uL (ref 0.00–0.07)
Basophils Absolute: 0 10*3/uL (ref 0.0–0.1)
Basophils Relative: 0 %
Eosinophils Absolute: 0.2 10*3/uL (ref 0.0–0.5)
Eosinophils Relative: 2 %
HCT: 33.3 % — ABNORMAL LOW (ref 36.0–46.0)
Hemoglobin: 10.5 g/dL — ABNORMAL LOW (ref 12.0–15.0)
Immature Granulocytes: 0 %
Lymphocytes Relative: 24 %
Lymphs Abs: 1.6 10*3/uL (ref 0.7–4.0)
MCH: 24.8 pg — ABNORMAL LOW (ref 26.0–34.0)
MCHC: 31.5 g/dL (ref 30.0–36.0)
MCV: 78.7 fL — ABNORMAL LOW (ref 80.0–100.0)
Monocytes Absolute: 0.5 10*3/uL (ref 0.1–1.0)
Monocytes Relative: 8 %
Neutro Abs: 4.4 10*3/uL (ref 1.7–7.7)
Neutrophils Relative %: 66 %
Platelets: 281 10*3/uL (ref 150–400)
RBC: 4.23 MIL/uL (ref 3.87–5.11)
RDW: 15.1 % (ref 11.5–15.5)
WBC: 6.7 10*3/uL (ref 4.0–10.5)
nRBC: 0 % (ref 0.0–0.2)

## 2024-07-16 LAB — CBG MONITORING, ED: Glucose-Capillary: 84 mg/dL (ref 70–99)

## 2024-07-16 LAB — URINALYSIS, ROUTINE W REFLEX MICROSCOPIC
Bilirubin Urine: NEGATIVE
Glucose, UA: NEGATIVE mg/dL
Hgb urine dipstick: NEGATIVE
Ketones, ur: NEGATIVE mg/dL
Leukocytes,Ua: NEGATIVE
Nitrite: NEGATIVE
Protein, ur: NEGATIVE mg/dL
Specific Gravity, Urine: 1.008 (ref 1.005–1.030)
pH: 6 (ref 5.0–8.0)

## 2024-07-16 LAB — HCG, SERUM, QUALITATIVE: Preg, Serum: NEGATIVE

## 2024-07-16 MED ORDER — KETOROLAC TROMETHAMINE 15 MG/ML IJ SOLN
15.0000 mg | Freq: Once | INTRAMUSCULAR | Status: AC
  Administered 2024-07-16: 15 mg via INTRAVENOUS
  Filled 2024-07-16: qty 1

## 2024-07-16 MED ORDER — SODIUM CHLORIDE 0.9 % IV BOLUS
1000.0000 mL | Freq: Once | INTRAVENOUS | Status: AC
  Administered 2024-07-16: 1000 mL via INTRAVENOUS

## 2024-07-16 NOTE — Discharge Instructions (Signed)
 As discussed, your evaluation today has been largely reassuring.  But, it is important that you monitor your condition carefully, and do not hesitate to return to the ED if you develop new, or concerning changes in your condition. ? ?Otherwise, please follow-up with your physician for appropriate ongoing care. ? ?

## 2024-07-16 NOTE — ED Provider Notes (Signed)
 " [place] EMERGENCY DEPARTMENT AT [facility] Provider Note   CSN: [id number] Arrival date & time: 07/16/24  1338     Patient presents with: Near Syncope   Valerie Newman is a 24 y.o. female.   HPI Patient BIB [facility] from home. Was outside in the snow shoveling, started feeling like she was going to pass out. Went inside. Held her hands up above her head. Sat down. Felt weak, is not sure if she blacked out. Feels back to normal on arrival. No blurred vision or pain.    Patient denies chest pain, before or after.  No current complaints.  She notes that she has previously been treated for anemia, has no history of congenital cardiac disease, nor any cardiac disease at all.   Prior to Admission medications  Medication Sig Start Date End Date Taking? Authorizing Provider  doxycycline  (VIBRAMYCIN ) 100 MG capsule Take 1 capsule (100 mg total) by mouth 2 (two) times daily. Patient not taking: Reported on 04/20/2024 02/13/24   Christopher Savannah, PA-C  fluconazole  (DIFLUCAN ) 150 MG tablet Take 1 tablet (150 mg total) by mouth every 3 (three) days. Patient not taking: Reported on 04/20/2024 02/13/24   Christopher Savannah, PA-C  ibuprofen  (ADVIL ) 800 MG tablet Take 1 tablet (800 mg total) by mouth every 8 (eight) hours as needed. Patient not taking: Reported on 04/20/2024 02/13/24   Christopher Savannah, PA-C  nitrofurantoin , macrocrystal-monohydrate, (MACROBID ) 100 MG capsule Take 1 capsule (100 mg total) by mouth 2 (two) times daily. Patient not taking: Reported on 04/20/2024 12/01/23   Murrill, Samantha, FNP  phenazopyridine  (PYRIDIUM ) 200 MG tablet Take 1 tablet (200 mg total) by mouth 3 (three) times daily. Patient not taking: Reported on 04/20/2024 12/01/23   Murrill, Samantha, FNP  SUMAtriptan  (IMITREX ) 50 MG tablet Take 25 mg (1 tablet total) by mouth at the start of the headache. May repeat in 2 hours x 1 if headache persists. Max of 2 tablets/24 hours. 06/28/24   Jaycee Greig PARAS, NP  topiramate  (TOPAMAX ) 25  MG tablet Take 1 tablet (25 mg total) by mouth daily. 06/28/24   Jaycee Greig PARAS, NP  triamcinolone  (KENALOG ) 0.025 % ointment Apply 1 Application topically 2 (two) times daily. Patient not taking: Reported on 04/20/2024 03/13/24   Iola Lukes, FNP    Allergies: Bee venom, Metronidazole , and Wasp venom    Review of Systems  Updated Vital Signs BP 114/79   Pulse 65   Temp 98.5 F (36.9 C) (Oral)   Resp (!) 22   Ht 1.6 m (5' 3)   Wt 60 kg   SpO2 100%   BMI 23.43 kg/m   Physical Exam Vitals and nursing note reviewed.  Constitutional:      General: She is not in acute distress.    Appearance: She is well-developed.  HENT:     Head: Normocephalic and atraumatic.  Eyes:     Conjunctiva/sclera: Conjunctivae normal.  Cardiovascular:     Rate and Rhythm: Normal rate and regular rhythm.     Pulses: Normal pulses.  Pulmonary:     Effort: Pulmonary effort is normal. No respiratory distress.     Breath sounds: No stridor.  Abdominal:     General: There is no distension.  Skin:    General: Skin is warm and dry.  Neurological:     Mental Status: She is alert and oriented to person, place, and time.     Cranial Nerves: No cranial nerve deficit.  Psychiatric:  Mood and Affect: Mood normal.     (all labs ordered are listed, but only abnormal results are displayed) Labs Reviewed  COMPREHENSIVE METABOLIC PANEL WITH GFR - Abnormal; Notable for the following components:      Result Value   Potassium 3.4 (*)    All other components within normal limits  CBC WITH DIFFERENTIAL/PLATELET - Abnormal; Notable for the following components:   Hemoglobin 10.5 (*)    HCT 33.3 (*)    MCV 78.7 (*)    MCH 24.8 (*)    All other components within normal limits  URINALYSIS, ROUTINE W REFLEX MICROSCOPIC - Abnormal; Notable for the following components:   Color, Urine STRAW (*)    All other components within normal limits  HCG, SERUM, QUALITATIVE  CBG MONITORING, ED    EKG: EKG  Interpretation Date/Time:  Sunday July 16 2024 14:08:45 EST Ventricular Rate:  69 PR Interval:  173 QRS Duration:  94 QT Interval:  381 QTC Calculation: 409 R Axis:   71  Text Interpretation: Sinus rhythm Confirmed by Garrick Charleston [phone]) on 07/16/2024 3:26:39 PM  Radiology: No results found.   Procedures   Medications Ordered in the ED  sodium chloride  0.9 % bolus 1,000 mL (1,000 mLs Intravenous New Bag/Given 07/16/24 1635)  ketorolac  (TORADOL ) 15 MG/ML injection 15 mg (15 mg Intravenous Given 07/16/24 1635)                                    Medical Decision Making Currently well.-year-old female presents for episode of syncope versus near syncope.  With history of anemia, suspicion for this, though other considerations include arrhythmia, dehydration, infection.. Initial vitals reassuring cardiac 70 sinus normal pulse ox 98% room air normal  Amount and/or Complexity of Data Reviewed Independent Historian: EMS Labs: ordered. Decision-making details documented in ED Course. ECG/medicine tests: ordered and independent interpretation performed. Decision-making details documented in ED Course.  Risk Prescription drug management. Decision regarding hospitalization.   5:48 PM On repeat exam patient in no distress, awakens easily, no ongoing complaints, companied by her mother.  Reassuring findings no evidence for infection, bacteremia, sepsis.  No cardiac decompensation after hours of monitoring, no substantial arrhythmia. No substantial anemia. Patient appropriate for close outpatient follow-up     Final diagnoses:  Near syncope    ED Discharge Orders     None          Garrick Charleston, MD 07/16/24 8251  "

## 2024-07-16 NOTE — ED Triage Notes (Addendum)
 Patient BIB [facility] from home. Was outside in the snow shoveling, started feeling like she was going to pass out. Went inside. Held her hands up above her head. Sat down. Felt weak, is not sure if she blacked out. Feels back to normal on arrival. No blurred vision or pain.

## 2024-10-11 ENCOUNTER — Ambulatory Visit: Payer: MEDICAID | Admitting: Neurology
